# Patient Record
Sex: Female | Born: 1955 | Race: Black or African American | Hispanic: No | Marital: Married | Smoking: Never smoker
Health system: Southern US, Community
[De-identification: ages and names within clinical notes are randomized; demographics above are authoritative.]

## PROBLEM LIST (undated history)

## (undated) DIAGNOSIS — I10 Essential (primary) hypertension: Secondary | ICD-10-CM

## (undated) DIAGNOSIS — E785 Hyperlipidemia, unspecified: Secondary | ICD-10-CM

## (undated) DIAGNOSIS — K219 Gastro-esophageal reflux disease without esophagitis: Secondary | ICD-10-CM

## (undated) DIAGNOSIS — C259 Malignant neoplasm of pancreas, unspecified: Secondary | ICD-10-CM

## (undated) DIAGNOSIS — F32A Depression, unspecified: Secondary | ICD-10-CM

## (undated) DIAGNOSIS — E669 Obesity, unspecified: Secondary | ICD-10-CM

## (undated) DIAGNOSIS — O2230 Deep phlebothrombosis in pregnancy, unspecified trimester: Secondary | ICD-10-CM

## (undated) DIAGNOSIS — E119 Type 2 diabetes mellitus without complications: Secondary | ICD-10-CM

## (undated) DIAGNOSIS — E039 Hypothyroidism, unspecified: Secondary | ICD-10-CM

## (undated) DIAGNOSIS — K5792 Diverticulitis of intestine, part unspecified, without perforation or abscess without bleeding: Secondary | ICD-10-CM

## (undated) DIAGNOSIS — F329 Major depressive disorder, single episode, unspecified: Secondary | ICD-10-CM

## (undated) HISTORY — DX: Hypercalcemia: E83.52

## (undated) HISTORY — DX: Malignant neoplasm of pancreas, unspecified: C25.9

---

## 2003-08-22 HISTORY — PX: ABDOMINAL HYSTERECTOMY: SHX81

## 2005-03-16 ENCOUNTER — Ambulatory Visit: Payer: Self-pay

## 2006-03-19 ENCOUNTER — Ambulatory Visit: Payer: Self-pay

## 2007-07-11 ENCOUNTER — Ambulatory Visit: Payer: Self-pay

## 2008-08-04 ENCOUNTER — Ambulatory Visit: Payer: Self-pay | Admitting: Gastroenterology

## 2008-09-17 ENCOUNTER — Ambulatory Visit: Payer: Self-pay

## 2009-12-01 ENCOUNTER — Ambulatory Visit: Payer: Self-pay | Admitting: Gastroenterology

## 2010-01-04 ENCOUNTER — Ambulatory Visit: Payer: Self-pay

## 2010-04-06 ENCOUNTER — Ambulatory Visit: Payer: Self-pay | Admitting: Endocrinology

## 2011-02-14 ENCOUNTER — Ambulatory Visit: Payer: Self-pay

## 2012-04-03 ENCOUNTER — Ambulatory Visit: Payer: Self-pay

## 2012-05-07 ENCOUNTER — Emergency Department: Payer: Self-pay | Admitting: Internal Medicine

## 2012-05-07 LAB — COMPREHENSIVE METABOLIC PANEL
Albumin: 3.5 g/dL (ref 3.4–5.0)
Alkaline Phosphatase: 108 U/L (ref 50–136)
Bilirubin,Total: 0.3 mg/dL (ref 0.2–1.0)
Creatinine: 1.4 mg/dL — ABNORMAL HIGH (ref 0.60–1.30)
Glucose: 95 mg/dL (ref 65–99)
Osmolality: 286 (ref 275–301)
SGPT (ALT): 28 U/L (ref 12–78)
Sodium: 142 mmol/L (ref 136–145)

## 2012-05-07 LAB — CBC WITH DIFFERENTIAL/PLATELET
Basophil #: 0 10*3/uL (ref 0.0–0.1)
Basophil %: 0.4 %
Eosinophil #: 0.8 10*3/uL — ABNORMAL HIGH (ref 0.0–0.7)
Eosinophil %: 6.5 %
HCT: 39.9 % (ref 35.0–47.0)
Lymphocyte #: 4.6 10*3/uL — ABNORMAL HIGH (ref 1.0–3.6)
Lymphocyte %: 39.3 %
MCHC: 32.7 g/dL (ref 32.0–36.0)
MCV: 88 fL (ref 80–100)
Monocyte #: 0.9 x10 3/mm (ref 0.2–0.9)
Monocyte %: 7.5 %
Neutrophil #: 5.4 10*3/uL (ref 1.4–6.5)
Platelet: 378 10*3/uL (ref 150–440)
RBC: 4.55 10*6/uL (ref 3.80–5.20)
RDW: 14 % (ref 11.5–14.5)
WBC: 11.7 10*3/uL — ABNORMAL HIGH (ref 3.6–11.0)

## 2012-05-07 LAB — TROPONIN I: Troponin-I: <0.02 ng/mL

## 2012-05-07 LAB — LIPASE, BLOOD: Lipase: 204 U/L (ref 73–393)

## 2012-06-06 ENCOUNTER — Ambulatory Visit: Payer: Self-pay | Admitting: Emergency Medicine

## 2012-06-06 LAB — HEPATIC FUNCTION PANEL A (ARMC)
Alkaline Phosphatase: 98 U/L (ref 50–136)
SGOT(AST): 19 U/L (ref 15–37)

## 2012-06-13 ENCOUNTER — Ambulatory Visit: Payer: Self-pay | Admitting: Emergency Medicine

## 2013-08-21 HISTORY — PX: CHOLECYSTECTOMY: SHX55

## 2013-09-24 ENCOUNTER — Ambulatory Visit: Payer: Self-pay

## 2014-12-08 NOTE — Op Note (Signed)
PATIENT NAME:  Ann Larson, Ann Larson MR#:  [id number] DATE OF BIRTH:  Jul 04, 1956  DATE OF PROCEDURE:  06/13/2012  PREOPERATIVE DIAGNOSIS: Acute cholecystitis possibly due to polyp of the gallbladder.  POSTOPERATIVE DIAGNOSIS: Acute cholecystitis possibly due to polyp of the gallbladder.   OPERATION: Laparoscopic cholecystectomy.   SURGEON: Samatha Anspach S. Jream Broyles, MD   INDICATIONS: This patient was having pain in the right upper quadrant of the abdomen, had an ultrasound done that showed a 7 mm polyp in the gallbladder.   PROCEDURE: The patient was then brought to surgery. Under general anesthesia, the abdomen was then prepped and draped. A small incision was made above the umbilicus. After cutting skin and subcutaneous tissue, the fascia was cut, abdomen was then entered, and CO2 was insufflated. Another trocar was put in the epigastric region. Two 5 millimeters were put in the right upper quadrant of the abdomen. Gallbladder was then pushed up and dissection was done over the cystic duct gallbladder junction. There was a lot of inflammation. It was dissected out. Cystic duct was dissected out completely. It was then clipped and cut and the gallbladder was removed from the liver bed. Cystic artery was then clipped and cut. After this was removed the area was then washed out. There was no bleeding or biliary leakage. After that all the trocars were removed under direct vision. The umbilical port was then closed with interrupted 0 Vicryl sutures and the skin was closed with skin staples. The patient tolerated the procedure well. Marcaine was injected. The patient was sent to the recovery room in satisfactory condition.   ____________________________ Welford Roche Phylis Bougie, MD msh:drc D: 06/13/2012 13:22:42 ET T: 06/13/2012 13:47:16 ET JOB#: [id number]  cc: Braylyn Eye S. Phylis Bougie, MD, <Dictator> Dionisio David, MD Sharene Butters MD ELECTRONICALLY SIGNED 06/18/2012 12:49

## 2015-06-30 ENCOUNTER — Encounter: Payer: Self-pay | Admitting: *Deleted

## 2015-07-01 ENCOUNTER — Ambulatory Visit: Payer: 59 | Admitting: Anesthesiology

## 2015-07-01 ENCOUNTER — Encounter: Payer: Self-pay | Admitting: Anesthesiology

## 2015-07-01 ENCOUNTER — Encounter
Admission: RE | Disposition: A | Discharge status: Home / Self Care | Payer: Self-pay | Source: Ambulatory Visit | Attending: Gastroenterology

## 2015-07-01 ENCOUNTER — Ambulatory Visit
Admission: RE | Admit: 2015-07-01 | Discharge: 2015-07-01 | Disposition: A | Discharge status: Home / Self Care | Payer: 59 | Source: Ambulatory Visit | Attending: Gastroenterology | Admitting: Gastroenterology

## 2015-07-01 DIAGNOSIS — J449 Chronic obstructive pulmonary disease, unspecified: Secondary | ICD-10-CM | POA: Diagnosis not present

## 2015-07-01 DIAGNOSIS — K5730 Diverticulosis of large intestine without perforation or abscess without bleeding: Secondary | ICD-10-CM | POA: Diagnosis not present

## 2015-07-01 DIAGNOSIS — K317 Polyp of stomach and duodenum: Secondary | ICD-10-CM | POA: Insufficient documentation

## 2015-07-01 DIAGNOSIS — Z7984 Long term (current) use of oral hypoglycemic drugs: Secondary | ICD-10-CM | POA: Diagnosis not present

## 2015-07-01 DIAGNOSIS — Z6836 Body mass index (BMI) 36.0-36.9, adult: Secondary | ICD-10-CM | POA: Insufficient documentation

## 2015-07-01 DIAGNOSIS — E119 Type 2 diabetes mellitus without complications: Secondary | ICD-10-CM | POA: Insufficient documentation

## 2015-07-01 DIAGNOSIS — Z79899 Other long term (current) drug therapy: Secondary | ICD-10-CM | POA: Insufficient documentation

## 2015-07-01 DIAGNOSIS — R1310 Dysphagia, unspecified: Secondary | ICD-10-CM | POA: Insufficient documentation

## 2015-07-01 DIAGNOSIS — E669 Obesity, unspecified: Secondary | ICD-10-CM | POA: Insufficient documentation

## 2015-07-01 DIAGNOSIS — K644 Residual hemorrhoidal skin tags: Secondary | ICD-10-CM | POA: Insufficient documentation

## 2015-07-01 DIAGNOSIS — R197 Diarrhea, unspecified: Secondary | ICD-10-CM | POA: Diagnosis present

## 2015-07-01 DIAGNOSIS — E039 Hypothyroidism, unspecified: Secondary | ICD-10-CM | POA: Diagnosis not present

## 2015-07-01 DIAGNOSIS — I10 Essential (primary) hypertension: Secondary | ICD-10-CM | POA: Diagnosis not present

## 2015-07-01 DIAGNOSIS — K210 Gastro-esophageal reflux disease with esophagitis: Secondary | ICD-10-CM | POA: Insufficient documentation

## 2015-07-01 DIAGNOSIS — F329 Major depressive disorder, single episode, unspecified: Secondary | ICD-10-CM | POA: Diagnosis not present

## 2015-07-01 DIAGNOSIS — K640 First degree hemorrhoids: Secondary | ICD-10-CM | POA: Insufficient documentation

## 2015-07-01 DIAGNOSIS — E785 Hyperlipidemia, unspecified: Secondary | ICD-10-CM | POA: Insufficient documentation

## 2015-07-01 HISTORY — PX: COLONOSCOPY WITH PROPOFOL: SHX5780

## 2015-07-01 HISTORY — DX: Type 2 diabetes mellitus without complications: E11.9

## 2015-07-01 HISTORY — DX: Hyperlipidemia, unspecified: E78.5

## 2015-07-01 HISTORY — PX: ESOPHAGOGASTRODUODENOSCOPY (EGD) WITH PROPOFOL: SHX5813

## 2015-07-01 HISTORY — DX: Depression, unspecified: F32.A

## 2015-07-01 HISTORY — DX: Obesity, unspecified: E66.9

## 2015-07-01 HISTORY — DX: Major depressive disorder, single episode, unspecified: F32.9

## 2015-07-01 HISTORY — DX: Essential (primary) hypertension: I10

## 2015-07-01 HISTORY — DX: Hypothyroidism, unspecified: E03.9

## 2015-07-01 LAB — GLUCOSE, CAPILLARY: Glucose-Capillary: 159 mg/dL — ABNORMAL HIGH (ref 65–99)

## 2015-07-01 SURGERY — COLONOSCOPY WITH PROPOFOL
Anesthesia: General

## 2015-07-01 MED ORDER — LIDOCAINE HCL (CARDIAC) 20 MG/ML IV SOLN
INTRAVENOUS | Status: DC | PRN
  Administered 2015-07-01: 30 mg via INTRAVENOUS

## 2015-07-01 MED ORDER — MIDAZOLAM HCL 5 MG/5ML IJ SOLN
INTRAMUSCULAR | Status: DC | PRN
  Administered 2015-07-01: 1 mg via INTRAVENOUS

## 2015-07-01 MED ORDER — METOPROLOL SUCCINATE ER 50 MG PO TB24
ORAL_TABLET | ORAL | Status: AC
  Administered 2015-07-01: 100 mg
  Filled 2015-07-01: qty 2

## 2015-07-01 MED ORDER — FENTANYL CITRATE (PF) 100 MCG/2ML IJ SOLN
INTRAMUSCULAR | Status: DC | PRN
  Administered 2015-07-01: 50 ug via INTRAVENOUS

## 2015-07-01 MED ORDER — SODIUM CHLORIDE 0.9 % IV SOLN
INTRAVENOUS | Status: DC
  Administered 2015-07-01: 09:00:00 via INTRAVENOUS
  Administered 2015-07-01: 1000 mL via INTRAVENOUS

## 2015-07-01 MED ORDER — PROPOFOL 10 MG/ML IV BOLUS
INTRAVENOUS | Status: DC | PRN
  Administered 2015-07-01: 20 mg via INTRAVENOUS
  Administered 2015-07-01: 100 mg via INTRAVENOUS

## 2015-07-01 MED ORDER — PROPOFOL 500 MG/50ML IV EMUL
INTRAVENOUS | Status: DC | PRN
  Administered 2015-07-01: 180 ug/kg/min via INTRAVENOUS

## 2015-07-01 NOTE — Op Note (Signed)
[facility] Gastroenterology Patient Name: Ann Larson Procedure Date: 07/01/2015 9:17 AM MRN: [id number] Account #: [id number] Date of Birth: 08-12-1956 Admit Type: Outpatient Age: 59 Room: [facility] ENDO ROOM 2 Gender: Female Note Status: Finalized Procedure:         Colonoscopy Indications:       This is the patient's first colonoscopy, Chronic diarrhea Patient Profile:   This is a 59 year old female. Providers:         Gerrit Heck. Rayann Heman, MD Referring MD:      Jordan Likes. Lavena Bullion (Referring MD) Medicines:         Propofol per Anesthesia Complications:     No immediate complications. Procedure:         Pre-Anesthesia Assessment:                    - Prior to the procedure, a History and Physical was                     performed, and patient medications, allergies and                     sensitivities were reviewed. The patient's tolerance of                     previous anesthesia was reviewed.                    After obtaining informed consent, the colonoscope was                     passed under direct vision. Throughout the procedure, the                     patient's blood pressure, pulse, and oxygen saturations                     were monitored continuously. The Colonoscope was                     introduced through the anus and advanced to the the                     terminal ileum. The colonoscopy was performed without                     difficulty. The patient tolerated the procedure well. The                     quality of the bowel preparation was good. Findings:      The perianal exam findings include non-thrombosed external hemorrhoids.      The terminal ileum appeared normal.      Multiple small and large-mouthed diverticula were found in the sigmoid       colon, at the hepatic flexure and in the ascending colon.      Internal hemorrhoids were found during retroflexion. The hemorrhoids       were Grade I (internal hemorrhoids that do not  prolapse).      The exam was otherwise without abnormality.      Biopsies for histology were taken with a cold forceps from the right       colon, left colon and rectum for evaluation of microscopic colitis. Impression:        - Non-thrombosed external hemorrhoids found on perianal  exam.                    - The examined portion of the ileum was normal.                    - Diverticulosis in the sigmoid colon, at the hepatic                     flexure and in the ascending colon.                    - Internal hemorrhoids.                    - The examination was otherwise normal.                    - No specimens collected. Recommendation:    - Observe patient in GI recovery unit.                    - High fiber diet.                    - Continue present medications.                    - Await pathology results.                    - Try low fodmap diet, probiotic, consider xifaxin for                     likely IBS-D                    - The findings and recommendations were discussed with the                     patient.                    - The findings and recommendations were discussed with the                     patient's family. Procedure Code(s): --- Professional ---                    [phone], Colonoscopy, flexible; with biopsy, single or                     multiple Diagnosis Code(s): --- Professional ---                    K64.0, First degree hemorrhoids                    K64.4, Residual hemorrhoidal skin tags                    K52.9, Noninfective gastroenteritis and colitis,                     unspecified                    K57.30, Diverticulosis of large intestine without                     perforation or abscess without bleeding CPT copyright 2014 American [employer]. All rights reserved. The codes documented in this report are preliminary and upon  coder review may  be revised to meet current compliance requirements. Mellody Life,  MD 07/01/2015 9:40:16 AM This report has been signed electronically. Number of Addenda: 0 Note Initiated On: 07/01/2015 9:17 AM Scope Withdrawal Time: 0 hours 13 minutes 24 seconds  Total Procedure Duration: 0 hours 15 minutes 46 seconds       [facility]

## 2015-07-01 NOTE — Anesthesia Preprocedure Evaluation (Signed)
Anesthesia Evaluation  Patient identified by MRN, date of birth, ID band Patient awake    Reviewed: Allergy & Precautions, NPO status , Patient's Chart, lab work & pertinent test results, reviewed documented beta blocker date and time   Airway Mallampati: II  TM Distance: >3 FB Neck ROM: Full    Dental  (+) Chipped   Pulmonary COPD,    Pulmonary exam normal breath sounds clear to auscultation       Cardiovascular hypertension, Pt. on medications and Pt. on home beta blockers      Neuro/Psych Depression    GI/Hepatic Neg liver ROS, GERD  Medicated and Controlled,  Endo/Other  diabetes, Well Controlled, Type 2Hypothyroidism   Renal/GU negative Renal ROS  negative genitourinary   Musculoskeletal negative musculoskeletal ROS (+)   Abdominal Normal abdominal exam  (+)   Peds negative pediatric ROS (+)  Hematology negative hematology ROS (+)   Anesthesia Other Findings   Reproductive/Obstetrics                             Anesthesia Physical Anesthesia Plan  ASA: III  Anesthesia Plan: General   Post-op Pain Management:    Induction: Intravenous  Airway Management Planned: Nasal Cannula  Additional Equipment:   Intra-op Plan:   Post-operative Plan:   Informed Consent: I have reviewed the patients History and Physical, chart, labs and discussed the procedure including the risks, benefits and alternatives for the proposed anesthesia with the patient or authorized representative who has indicated his/her understanding and acceptance.   Dental advisory given  Plan Discussed with: CRNA and Surgeon  Anesthesia Plan Comments:         Anesthesia Quick Evaluation

## 2015-07-01 NOTE — H&P (Signed)
  Primary Care Physician:  Lorelee Market, MD  Pre-Procedure History & Physical: HPI:  Ann Larson is a 59 y.o. female is here for an colonoscopy / EGD   Past Medical History  Diagnosis Date  . COPD (chronic obstructive pulmonary disease) ([place])   . Depression   . Diabetes mellitus without complication ([place])   . Hypertension   . Obesity   . Hyperlipidemia   . Hypothyroidism     History reviewed. No pertinent past surgical history.  Prior to Admission medications   Medication Sig Start Date End Date Taking? Authorizing Provider  levothyroxine (SYNTHROID, LEVOTHROID) 125 MCG tablet Take 125 mcg by mouth daily before breakfast.   Yes Historical Provider, MD  lisinopril (PRINIVIL,ZESTRIL) 10 MG tablet Take 10 mg by mouth daily.   Yes Historical Provider, MD  metFORMIN (GLUMETZA) 500 MG (MOD) 24 hr tablet Take 500 mg by mouth daily with supper.   Yes Historical Provider, MD  metoprolol succinate (TOPROL-XL) 100 MG 24 hr tablet Take 100 mg by mouth daily. Take with or immediately following a meal.   Yes Historical Provider, MD  omeprazole (PRILOSEC) 20 MG capsule Take 20 mg by mouth daily.   Yes Historical Provider, MD  polyethylene glycol-electrolytes (NULYTELY/GOLYTELY) 420 G solution Take 4,000 mLs by mouth once.   Yes Historical Provider, MD  simvastatin (ZOCOR) 20 MG tablet Take 20 mg by mouth daily.   Yes Historical Provider, MD  venlafaxine XR (EFFEXOR-XR) 75 MG 24 hr capsule Take 75 mg by mouth daily with breakfast.   Yes Historical Provider, MD    Allergies as of 06/01/2015  . (Not on File)    History reviewed. No pertinent family history.  Social History   Social History  . Marital Status: Married    Spouse Name: N/A  . Number of Children: N/A  . Years of Education: N/A   Occupational History  . Not on file.   Social History Main Topics  . Smoking status: Not on file  . Smokeless tobacco: Not on file  . Alcohol Use: Not on file  . Drug Use: Not on  file  . Sexual Activity: Not on file   Other Topics Concern  . Not on file   Social History Narrative     Physical Exam: BP 181/97 mmHg  Pulse 74  Temp(Src) 99.7 F (37.6 C) (Tympanic)  Resp 16  Ht 5\' 7"  (1.702 m)  Wt 106.595 kg (235 lb)  BMI 36.80 kg/m2  SpO2 98% General:   Alert,  pleasant and cooperative in NAD Head:  Normocephalic and atraumatic. Neck:  Supple; no masses or thyromegaly. Lungs:  Clear throughout to auscultation.    Heart:  Regular rate and rhythm. Abdomen:  Soft, nontender and nondistended. Normal bowel sounds, without guarding, and without rebound., + obese abdomen Neurologic:  Alert and  oriented x4;  grossly normal neurologically.  Impression/Plan: SAMEENA CORRIHER is here for an colonoscopy to be performed for diarrhea, EGD foro dysphagia  Risks, benefits, limitations, and alternatives regarding  Colonoscopy / EGD have been reviewed with the patient.  Questions have been answered.  All parties agreeable.   Josefine Class, MD  07/01/2015, 8:48 AM

## 2015-07-01 NOTE — Op Note (Signed)
[facility] Gastroenterology Patient Name: Ann Larson Procedure Date: 07/01/2015 8:50 AM MRN: [id number] Account #: [id number] Date of Birth: [birth year] Admit Type: Outpatient Age: 59 Room: [facility] ENDO ROOM 2 Gender: Female Note Status: Finalized Procedure:         Upper GI endoscopy Indications:       Dysphagia Patient Profile:   This is a 59 year old female. Providers:         Gerrit Heck. Rayann Heman, MD Referring MD:      Jordan Likes. Lavena Bullion (Referring MD) Medicines:         Propofol per Anesthesia Complications:     No immediate complications. Procedure:         Pre-Anesthesia Assessment:                    - Prior to the procedure, a History and Physical was                     performed, and patient medications, allergies and                     sensitivities were reviewed. The patient's tolerance of                     previous anesthesia was reviewed.                    After obtaining informed consent, the endoscope was passed                     under direct vision. Throughout the procedure, the                     patient's blood pressure, pulse, and oxygen saturations                     were monitored continuously. The Endoscope was introduced                     through the mouth, and advanced to the second part of                     duodenum. The upper GI endoscopy was accomplished without                     difficulty. The patient tolerated the procedure well. Findings:      The esophagus was normal.      Multiple 2 to 8 mm sessile polyps with no stigmata of recent bleeding       were found in the gastric body. Biopsies were taken with a cold forceps       for histology.      The examined duodenum was normal.      Four biopsies were obtained with cold forceps for histology randomly in       the lower third of the esophagus.      Four biopsies were obtained with cold forceps for histology randomly in       the upper third of the esophagus.      A  guidewire was placed and the scope was withdrawn. Dilation was       performed in the entire esophagus with a Savary dilator with no       resistance at 51 Fr. Impression:        - Normal esophagus.                    -  Multiple gastric polyps. Biopsied.                    - Normal examined duodenum.                    - Four biopsies were obtained in the lower third of the                     esophagus.                    - Four biopsies were obtained in the upper third of the                     esophagus.                    - Dilation attempted in the entire esophagus. Successful. Recommendation:    - Perform a colonoscopy today.                    - Continue present medications.                    - Await pathology results.                    - Consider esophageal manometry if biopsies are                     non-diagnostic and dysphagia continues despite dilation                     today.                    - The findings and recommendations were discussed with the                     patient.                    - The findings and recommendations were discussed with the                     patient's family. Procedure Code(s): --- Professional ---                    [phone], Esophagogastroduodenoscopy, flexible, transoral;                     with insertion of guide wire followed by passage of                     dilator(s) through esophagus over guide wire                    [id number], Esophagogastroduodenoscopy, flexible, transoral;                     with biopsy, single or multiple Diagnosis Code(s): --- Professional ---                    K31.7, Polyp of stomach and duodenum                    R13.10, Dysphagia, unspecified CPT copyright 2014 American Medical Association. All rights reserved. The codes documented in this report are preliminary and upon coder review may  be revised to meet current compliance requirements. Mellody Life, MD  07/01/2015 9:17:44 AM This report has  been signed electronically. Number of Addenda: 0 Note Initiated On: 07/01/2015 8:50 AM      [facility]

## 2015-07-01 NOTE — Transfer of Care (Signed)
Immediate Anesthesia Transfer of Care Note  Patient: Ann Larson  Procedure(s) Performed: Procedure(s): COLONOSCOPY WITH PROPOFOL (N/A) ESOPHAGOGASTRODUODENOSCOPY (EGD) WITH PROPOFOL (N/A)  Patient Location: PACU and Short Stay  Anesthesia Type:General  Level of Consciousness: awake, oriented and patient cooperative  Airway & Oxygen Therapy: Patient Spontanous Breathing and Patient connected to nasal cannula oxygen  Post-op Assessment: Report given to RN and Post -op Vital signs reviewed and stable  Post vital signs: Reviewed and stable  Last Vitals:  Filed Vitals:   07/01/15 0945  BP:   Pulse: 64  Temp: 36.1 C  Resp: 18    Complications: No apparent anesthesia complications

## 2015-07-01 NOTE — Anesthesia Postprocedure Evaluation (Signed)
  Anesthesia Post-op Note  Patient: Ann Larson  Procedure(s) Performed: Procedure(s): COLONOSCOPY WITH PROPOFOL (N/A) ESOPHAGOGASTRODUODENOSCOPY (EGD) WITH PROPOFOL (N/A)  Anesthesia type:General  Patient location: PACU  Post pain: Pain level controlled  Post assessment: Post-op Vital signs reviewed, Patient's Cardiovascular Status Stable, Respiratory Function Stable, Patent Airway and No signs of Nausea or vomiting  Post vital signs: Reviewed and stable  Last Vitals:  Filed Vitals:   07/01/15 1014  BP: 130/57  Pulse: 55  Temp:   Resp: 16    Level of consciousness: awake, alert  and patient cooperative  Complications: No apparent anesthesia complications

## 2015-07-01 NOTE — Discharge Instructions (Signed)

## 2015-07-02 LAB — SURGICAL PATHOLOGY

## 2015-07-05 ENCOUNTER — Encounter: Payer: Self-pay | Admitting: Gastroenterology

## 2015-11-03 ENCOUNTER — Other Ambulatory Visit: Payer: Self-pay | Admitting: Obstetrics and Gynecology

## 2015-11-03 DIAGNOSIS — Z1231 Encounter for screening mammogram for malignant neoplasm of breast: Secondary | ICD-10-CM

## 2015-11-08 ENCOUNTER — Ambulatory Visit
Admission: RE | Admit: 2015-11-08 | Discharge: 2015-11-08 | Disposition: A | Discharge status: Home / Self Care | Payer: 59 | Source: Ambulatory Visit | Attending: Obstetrics and Gynecology | Admitting: Obstetrics and Gynecology

## 2015-11-08 DIAGNOSIS — Z1231 Encounter for screening mammogram for malignant neoplasm of breast: Secondary | ICD-10-CM | POA: Diagnosis present

## 2016-11-12 ENCOUNTER — Encounter: Payer: Self-pay | Admitting: Emergency Medicine

## 2016-11-12 ENCOUNTER — Emergency Department: Payer: BLUE CROSS/BLUE SHIELD

## 2016-11-12 ENCOUNTER — Emergency Department
Admission: EM | Admit: 2016-11-12 | Discharge: 2016-11-13 | Disposition: A | Discharge status: Home / Self Care | Payer: BLUE CROSS/BLUE SHIELD | Attending: Emergency Medicine | Admitting: Emergency Medicine

## 2016-11-12 DIAGNOSIS — E039 Hypothyroidism, unspecified: Secondary | ICD-10-CM | POA: Insufficient documentation

## 2016-11-12 DIAGNOSIS — K5732 Diverticulitis of large intestine without perforation or abscess without bleeding: Secondary | ICD-10-CM | POA: Insufficient documentation

## 2016-11-12 DIAGNOSIS — I10 Essential (primary) hypertension: Secondary | ICD-10-CM | POA: Diagnosis not present

## 2016-11-12 DIAGNOSIS — R55 Syncope and collapse: Secondary | ICD-10-CM | POA: Insufficient documentation

## 2016-11-12 DIAGNOSIS — Z79899 Other long term (current) drug therapy: Secondary | ICD-10-CM | POA: Diagnosis not present

## 2016-11-12 DIAGNOSIS — E119 Type 2 diabetes mellitus without complications: Secondary | ICD-10-CM | POA: Insufficient documentation

## 2016-11-12 DIAGNOSIS — Z7984 Long term (current) use of oral hypoglycemic drugs: Secondary | ICD-10-CM | POA: Diagnosis not present

## 2016-11-12 DIAGNOSIS — K5792 Diverticulitis of intestine, part unspecified, without perforation or abscess without bleeding: Secondary | ICD-10-CM

## 2016-11-12 DIAGNOSIS — R1032 Left lower quadrant pain: Secondary | ICD-10-CM | POA: Diagnosis present

## 2016-11-12 HISTORY — DX: Gastro-esophageal reflux disease without esophagitis: K21.9

## 2016-11-12 LAB — HEPATIC FUNCTION PANEL
ALT: 26 U/L (ref 14–54)
AST: 23 U/L (ref 15–41)
Albumin: 3.6 g/dL (ref 3.5–5.0)
Alkaline Phosphatase: 96 U/L (ref 38–126)
BILIRUBIN TOTAL: 0.4 mg/dL (ref 0.3–1.2)
Total Protein: 7.4 g/dL (ref 6.5–8.1)

## 2016-11-12 LAB — CBC
HCT: 39.5 % (ref 35.0–47.0)
Hemoglobin: 13.2 g/dL (ref 12.0–16.0)
MCH: 29 pg (ref 26.0–34.0)
MCHC: 33.3 g/dL (ref 32.0–36.0)
MCV: 87 fL (ref 80.0–100.0)
PLATELETS: 356 10*3/uL (ref 150–440)
RBC: 4.54 MIL/uL (ref 3.80–5.20)
RDW: 14.2 % (ref 11.5–14.5)
WBC: 7.2 10*3/uL (ref 3.6–11.0)

## 2016-11-12 LAB — DIFFERENTIAL
BASOS ABS: 0 10*3/uL (ref 0–0.1)
BASOS PCT: 0 %
EOS ABS: 0.3 10*3/uL (ref 0–0.7)
Eosinophils Relative: 4 %
Lymphocytes Relative: 28 %
Lymphs Abs: 2.1 10*3/uL (ref 1.0–3.6)
MONOS PCT: 8 %
Monocytes Absolute: 0.6 10*3/uL (ref 0.2–0.9)
NEUTROS PCT: 60 %
Neutro Abs: 4.3 10*3/uL (ref 1.4–6.5)

## 2016-11-12 LAB — BASIC METABOLIC PANEL
Anion gap: 5 (ref 5–15)
BUN: 20 mg/dL (ref 6–20)
CALCIUM: 10.7 mg/dL — AB (ref 8.9–10.3)
CO2: 28 mmol/L (ref 22–32)
CREATININE: 1.16 mg/dL — AB (ref 0.44–1.00)
Chloride: 109 mmol/L (ref 101–111)
GFR calc non Af Amer: 50 mL/min — ABNORMAL LOW (ref >60)
GFR, EST AFRICAN AMERICAN: 58 mL/min — AB (ref >60)
Glucose, Bld: 201 mg/dL — ABNORMAL HIGH (ref 65–99)
Potassium: 4.1 mmol/L (ref 3.5–5.1)
SODIUM: 142 mmol/L (ref 135–145)

## 2016-11-12 LAB — TROPONIN I: Troponin I: <0.03 ng/mL (ref <0.03)

## 2016-11-12 LAB — TSH: TSH: 1.798 u[IU]/mL (ref 0.350–4.500)

## 2016-11-12 LAB — URINALYSIS, COMPLETE (UACMP) WITH MICROSCOPIC
BILIRUBIN URINE: NEGATIVE
GLUCOSE, UA: NEGATIVE mg/dL
Hgb urine dipstick: NEGATIVE
KETONES UR: NEGATIVE mg/dL
LEUKOCYTES UA: NEGATIVE
Nitrite: NEGATIVE
PH: 5 (ref 5.0–8.0)
Protein, ur: NEGATIVE mg/dL
Specific Gravity, Urine: 1.026 (ref 1.005–1.030)

## 2016-11-12 LAB — T4, FREE: Free T4: 1.09 ng/dL (ref 0.61–1.12)

## 2016-11-12 LAB — LIPASE, BLOOD: LIPASE: 17 U/L (ref 11–51)

## 2016-11-12 LAB — MAGNESIUM: MAGNESIUM: 2.1 mg/dL (ref 1.7–2.4)

## 2016-11-12 MED ORDER — IOPAMIDOL (ISOVUE-300) INJECTION 61%
100.0000 mL | Freq: Once | INTRAVENOUS | Status: AC | PRN
  Administered 2016-11-12: 100 mL via INTRAVENOUS

## 2016-11-12 MED ORDER — IOPAMIDOL (ISOVUE-300) INJECTION 61%
30.0000 mL | Freq: Once | INTRAVENOUS | Status: AC | PRN
  Administered 2016-11-12: 30 mL via ORAL

## 2016-11-12 NOTE — ED Provider Notes (Addendum)
[facility] Emergency Department Provider Note  ____________________________________________   First MD Initiated Contact with Patient 11/12/16 2224     (approximate)  I have reviewed the triage vital signs and the nursing notes.   HISTORY  Chief Complaint Abdominal Pain    HPI Ann Larson is a 61 y.o. female with history as listed below who presents for evaluation of a near syncopal episode as well as left lower quadrant abdominal pain.  She states that her abdominal pain has been present for several weeks but is gradually gotten worse recently.  Nothing makes it better and movement makes it worse.  It is primarily in the left lower quadrant but radiates to the rest of her abdomen.  She denies nausea and vomiting, fever/chills, chest pain, shortness of breath, dysuria.  Regarding the near syncopal episodes, she reports that she had a normal day at church, singing and participating and possibly overexerting herself.  She felt fine until she was in the car on the way home when she became sweaty and lightheaded and felt like she was going to pass out.  She never had any chest pain but did feel like her heart was pounding.  Overall she describes both symptoms as severe although she is not in any pain while lying comfortably in the exam bed   Past Medical History:  Diagnosis Date  . Depression   . Diabetes mellitus without complication ([place])   . GERD (gastroesophageal reflux disease)   . Hyperlipidemia   . Hypertension   . Hypothyroidism   . Obesity     There are no active problems to display for this patient.   Past Surgical History:  Procedure Laterality Date  . ABDOMINAL HYSTERECTOMY  2005  . CHOLECYSTECTOMY  2015  . COLONOSCOPY WITH PROPOFOL N/A 07/01/2015   Procedure: COLONOSCOPY WITH PROPOFOL;  Surgeon: Josefine Class, MD;  Location: [facility] ENDOSCOPY;  Service: Endoscopy;  Laterality: N/A;  . ESOPHAGOGASTRODUODENOSCOPY (EGD) WITH  PROPOFOL N/A 07/01/2015   Procedure: ESOPHAGOGASTRODUODENOSCOPY (EGD) WITH PROPOFOL;  Surgeon: Josefine Class, MD;  Location: [facility] ENDOSCOPY;  Service: Endoscopy;  Laterality: N/A;    Prior to Admission medications   Medication Sig Start Date End Date Taking? Authorizing Provider  ciprofloxacin (CIPRO) 500 MG tablet Take 1 tablet (500 mg total) by mouth 2 (two) times daily. 11/13/16 11/23/16  Hinda Kehr, MD  levothyroxine (SYNTHROID, LEVOTHROID) 125 MCG tablet Take 125 mcg by mouth daily before breakfast.    Historical Provider, MD  lisinopril (PRINIVIL,ZESTRIL) 10 MG tablet Take 10 mg by mouth daily.    Historical Provider, MD  metFORMIN (GLUMETZA) 500 MG (MOD) 24 hr tablet Take 500 mg by mouth daily with supper.    Historical Provider, MD  metoprolol succinate (TOPROL-XL) 100 MG 24 hr tablet Take 100 mg by mouth daily. Take with or immediately following a meal.    Historical Provider, MD  metroNIDAZOLE (FLAGYL) 500 MG tablet Take 1 tablet (500 mg total) by mouth 3 (three) times daily. 11/13/16 11/23/16  Hinda Kehr, MD  omeprazole (PRILOSEC) 20 MG capsule Take 20 mg by mouth daily.    Historical Provider, MD  polyethylene glycol-electrolytes (NULYTELY/GOLYTELY) 420 G solution Take 4,000 mLs by mouth once.    Historical Provider, MD  simvastatin (ZOCOR) 20 MG tablet Take 20 mg by mouth daily.    Historical Provider, MD  venlafaxine XR (EFFEXOR-XR) 75 MG 24 hr capsule Take 75 mg by mouth daily with breakfast.    Historical Provider, MD  Allergies Sulfa antibiotics  History reviewed. No pertinent family history.  Social History Social History  Substance Use Topics  . Smoking status: Never Smoker  . Smokeless tobacco: Never Used  . Alcohol use No    Review of Systems Constitutional: No fever/chills Eyes: No visual changes. ENT: No sore throat. Cardiovascular: Denies chest pain. + Palpitations when she had her near syncopal episode Respiratory: Denies shortness of  breath. Gastrointestinal: Gradually worsening left lower quadrant abdominal pain.  No nausea, no vomiting.  No diarrhea.  No constipation. Genitourinary: Negative for dysuria. Musculoskeletal: Negative for back pain. Skin: Negative for rash. Neurological: Negative for headaches, focal weakness or numbness.  Near syncopal episode or to arrival  10-point ROS otherwise negative.  ____________________________________________   PHYSICAL EXAM:  VITAL SIGNS: ED Triage Vitals  Enc Vitals Group     BP 11/12/16 1741 (!) 158/61     Pulse Rate 11/12/16 1741 77     Resp 11/12/16 1741 20     Temp 11/12/16 1741 98.2 F (36.8 C)     Temp Source 11/12/16 1741 Oral     SpO2 11/12/16 1741 95 %     Weight 11/12/16 1742 225 lb (102.1 kg)     Height 11/12/16 1742 5\' 10"  (1.778 m)     Head Circumference --      Peak Flow --      Pain Score 11/12/16 1753 3     Pain Loc --      Pain Edu? --      Excl. in [place]? --     Constitutional: Alert and oriented. Well appearing and in no acute distress. Eyes: Conjunctivae are normal. PERRL. EOMI. Head: Atraumatic. Nose: No congestion/rhinnorhea. Mouth/Throat: Mucous membranes are moist. Neck: No stridor.  No meningeal signs.   Cardiovascular: Normal rate, regular rhythm. Good peripheral circulation. Grossly normal heart sounds. Respiratory: Normal respiratory effort.  No retractions. Lungs CTAB. Gastrointestinal: Obese.  Soft with severe tenderness to palpation of the left lower quadrant with rebound tenderness and guarding.  She also has a positive Rovsing sign where palpation on the right side of her abdomen causes left sided tenderness. Musculoskeletal: No lower extremity tenderness nor edema. No gross deformities of extremities. Neurologic:  Normal speech and language. No gross focal neurologic deficits are appreciated.  Skin:  Skin is warm, dry and intact. No rash noted. Psychiatric: Mood and affect are normal. Speech and behavior are  normal.  ____________________________________________   LABS (all labs ordered are listed, but only abnormal results are displayed)  Labs Reviewed  BASIC METABOLIC PANEL - Abnormal; Notable for the following:       Result Value   Glucose, Bld 201 (*)    Creatinine, Ser 1.16 (*)    Calcium 10.7 (*)    GFR calc non Af Amer 50 (*)    GFR calc Af Amer 58 (*)    All other components within normal limits  URINALYSIS, COMPLETE (UACMP) WITH MICROSCOPIC - Abnormal; Notable for the following:    Color, Urine YELLOW (*)    APPearance HAZY (*)    Bacteria, UA RARE (*)    Squamous Epithelial / LPF 0-5 (*)    All other components within normal limits  HEPATIC FUNCTION PANEL - Abnormal; Notable for the following:    Bilirubin, Direct <0.1 (*)    All other components within normal limits  CBC  DIFFERENTIAL  T4, FREE  TSH  LIPASE, BLOOD  TROPONIN I  MAGNESIUM  TROPONIN I   ____________________________________________  EKG  ED ECG REPORT I, Kyndal Heringer, the attending physician, personally viewed and interpreted this ECG.  Date: 11/12/2016 EKG Time: 18:01 Rate: 77 Rhythm: normal sinus rhythm QRS Axis: normal Intervals: LVH ST/T Wave abnormalities: Non-specific ST segment / T-wave changes, but no evidence of acute ischemia. Conduction Disturbances: none Narrative Interpretation: unremarkable  ____________________________________________  RADIOLOGY   Ct Abdomen Pelvis W Contrast  Result Date: 11/12/2016 CLINICAL DATA:  Left lower quadrant pain with rebound. Progressive pain for 1 month. Nausea. EXAM: CT ABDOMEN AND PELVIS WITH CONTRAST TECHNIQUE: Multidetector CT imaging of the abdomen and pelvis was performed using the standard protocol following bolus administration of intravenous contrast. CONTRAST:  131mL ISOVUE-300 IOPAMIDOL (ISOVUE-300) INJECTION 61% COMPARISON:  None. FINDINGS: Lower chest: Minimal lingular and lower lobe atelectasis. No pleural fluid. Motion artifact.  Hepatobiliary: No focal liver abnormality is seen. Status post cholecystectomy. No biliary dilatation. Pancreas: Minimal stranding about the tail the pancreas may be secondary to left colonic inflammation. Questionable soft tissue lesion above the pancreatic tail, margins are ill-defined, motion artifact partially obscures this region. This is in the region of the splenic hilum and similar density to the spleen, may simply represent a splenule. No ductal dilatation. Fatty atrophy of the head and uncinate process. Spleen: Normal in size. Adrenals/Urinary Tract: No adrenal nodule. No hydronephrosis or perinephric edema. Symmetric renal enhancement and excretion. Urinary bladder is near completely decompressed. Stomach/Bowel: Multifocal colonic diverticulosis with acute diverticulitis involving distal descending colon. An inflamed diverticulum has surrounding fat stranding and soft tissue thickening in the pericolic gutter. No abscess or free air. Additional noninflamed diverticula are seen in the transverse, descending, and sigmoid colon. No additional acute diverticulitis. Normal appendix. No small bowel inflammation. Vascular/Lymphatic: Minimal aortic atherosclerosis without aneurysm. Small pericolonic lymph nodes adjacent to diverticular inflammation in the descending colon, no additional adenopathy. Reproductive: Status post hysterectomy. No adnexal masses. Other: No free air or intra-abdominal abscess. No ascites. Broad necked fat containing umbilical hernia. Musculoskeletal: There are no acute or suspicious osseous abnormalities. There is degenerative change in the spine. IMPRESSION: 1. Acute diverticulitis involving the distal descending colon. No evidence of perforation or abscess. Multiple noninflamed diverticula throughout the remainder of the colon from the transverse through the sigmoid. 2. Questionable pancreatic tail lesion, borders are not well defined. This may simply reflect a splenule given similar  density to the adjacent spleen, however recommend nonemergent MRI characterization. Electronically Signed   By: Jeb Levering M.D.   On: 11/12/2016 23:24    ____________________________________________   PROCEDURES  Critical Care performed: No   Procedure(s) performed:   Procedures   ____________________________________________   INITIAL IMPRESSION / ASSESSMENT AND PLAN / ED COURSE  Pertinent labs & imaging results that were available during my care of the patient were reviewed by me and considered in my medical decision making (see chart for details).  Although the patient ostensibly came in for evaluation of near-syncope, her abdominal exam is the most concerning.  I suspect she may have diverticulitis but reassuringly her signs are normal and as for her lab work is normal as well.  She did not have hepatic function tests normal lipase drawn in triage said that it those on.  I will evaluate with a CT scan of her abdomen and pelvis.  I believe that she should have a repeat troponin given the near syncopal episode as it may be an anginal equivalent but if her evaluation is reassuring she should be able to be discharged and follow-up as an outpatient.  Clinical Course as of Nov 14 2  Mon Nov 13, 2016  0003 The patient has diverticulitis.  I am checking a second troponin and I have transferred emergency department care to Dr. Dahlia Client to follow up on the troponin.  I discussed the plan with the patient and she does want to go home and is comfortable with the plan for outpatient antibiotics.  A believe that she had a vasovagal episode or possibly just overexerted herself and she agrees with that.  She will drink plenty of fluids.  [CF]    Clinical Course User Index [CF] Hinda Kehr, MD    ____________________________________________  FINAL CLINICAL IMPRESSION(S) / ED DIAGNOSES  Final diagnoses:  Near syncope  Acute diverticulitis     MEDICATIONS GIVEN DURING THIS  VISIT:  Medications  iopamidol (ISOVUE-300) 61 % injection 30 mL (30 mLs Oral Contrast Given 11/12/16 2242)  iopamidol (ISOVUE-300) 61 % injection 100 mL (100 mLs Intravenous Contrast Given 11/12/16 2305)     NEW OUTPATIENT MEDICATIONS STARTED DURING THIS VISIT:  New Prescriptions   CIPROFLOXACIN (CIPRO) 500 MG TABLET    Take 1 tablet (500 mg total) by mouth 2 (two) times daily.   METRONIDAZOLE (FLAGYL) 500 MG TABLET    Take 1 tablet (500 mg total) by mouth 3 (three) times daily.    Modified Medications   No medications on file    Discontinued Medications   No medications on file     Note:  This document was prepared using Dragon voice recognition software and may include unintentional dictation errors.    Hinda Kehr, MD 11/12/16 1017    Hinda Kehr, MD 11/13/16 5102

## 2016-11-12 NOTE — ED Triage Notes (Signed)
Pt arrived to ED with c/o near syncope episodes that occurred 2x today. Pt became dizzy and diaphoretic. Pt is as diabetic and checked her CBG and states it was 225 approx 30 mins ago. Pt also states she has been having LLQ pain that has been ongoing for about 3 weeks.

## 2016-11-12 NOTE — ED Notes (Signed)
Santiago Glad, CT, called d/t pt finishing contrast

## 2016-11-13 LAB — TROPONIN I

## 2016-11-13 MED ORDER — METRONIDAZOLE 500 MG PO TABS: 500.0000 mg | ORAL_TABLET | Freq: Three times a day (TID) | ORAL | 0 refills | Status: AC

## 2016-11-13 MED ORDER — CIPROFLOXACIN HCL 500 MG PO TABS: 500.0000 mg | ORAL_TABLET | Freq: Two times a day (BID) | ORAL | 0 refills | Status: AC

## 2016-11-13 NOTE — Discharge Instructions (Signed)
We believe your symptoms are caused by diverticulitis.  Most of the time this condition (please read through the included information) can be cured with outpatient antibiotics.  Please take the full course of prescribed medication(s) and follow up with the doctors recommended above.  Return to the ED if your abdominal pain worsens or fails to improve, you develop bloody vomiting, bloody diarrhea, you are unable to tolerate fluids due to vomiting, fever greater than 101, or other symptoms that concern you.    

## 2016-11-13 NOTE — ED Provider Notes (Signed)
-----------------------------------------   12:44 AM on 11/13/2016 -----------------------------------------   Blood pressure (!) 157/82, pulse 60, temperature 98.2 F (36.8 C), temperature source Oral, resp. rate 16, height 5\' 10"  (1.778 m), weight 225 lb (102.1 kg), SpO2 97 %.  Assuming care from Dr. Karma Greaser.  In short, Ann Larson is a 61 y.o. female with a chief complaint of Abdominal Pain .  Refer to the original H&P for additional details.  The current plan of care is to follow-up the results of the repeat troponin and disposition the patient.   Clinical Course as of Nov 14 42  Mon Nov 13, 2016  0003 The patient has diverticulitis.  I am checking a second troponin and I have transferred emergency department care to Dr. Dahlia Client to follow up on the troponin.  I discussed the plan with the patient and she does want to go home and is comfortable with the plan for outpatient antibiotics.  A believe that she had a vasovagal episode or possibly just overexerted herself and she agrees with that.  She will drink plenty of fluids.  [CF]    Clinical Course User Index [CF] Hinda Kehr, MD   The patient's troponin is negative. She'll be discharged home to follow-up with her primary care physician.   Loney Hering, MD 11/13/16 [phone]

## 2017-02-06 ENCOUNTER — Emergency Department
Admission: EM | Admit: 2017-02-06 | Discharge: 2017-02-07 | Disposition: A | Discharge status: Home / Self Care | Payer: BLUE CROSS/BLUE SHIELD | Attending: Emergency Medicine | Admitting: Emergency Medicine

## 2017-02-06 ENCOUNTER — Encounter: Payer: Self-pay | Admitting: Emergency Medicine

## 2017-02-06 DIAGNOSIS — R42 Dizziness and giddiness: Secondary | ICD-10-CM | POA: Diagnosis not present

## 2017-02-06 DIAGNOSIS — R1031 Right lower quadrant pain: Secondary | ICD-10-CM | POA: Diagnosis present

## 2017-02-06 DIAGNOSIS — R197 Diarrhea, unspecified: Secondary | ICD-10-CM | POA: Insufficient documentation

## 2017-02-06 DIAGNOSIS — Z79899 Other long term (current) drug therapy: Secondary | ICD-10-CM | POA: Insufficient documentation

## 2017-02-06 DIAGNOSIS — R109 Unspecified abdominal pain: Secondary | ICD-10-CM

## 2017-02-06 DIAGNOSIS — K869 Disease of pancreas, unspecified: Secondary | ICD-10-CM

## 2017-02-06 DIAGNOSIS — R110 Nausea: Secondary | ICD-10-CM | POA: Diagnosis not present

## 2017-02-06 DIAGNOSIS — E119 Type 2 diabetes mellitus without complications: Secondary | ICD-10-CM | POA: Diagnosis not present

## 2017-02-06 DIAGNOSIS — Z7984 Long term (current) use of oral hypoglycemic drugs: Secondary | ICD-10-CM | POA: Diagnosis not present

## 2017-02-06 DIAGNOSIS — E039 Hypothyroidism, unspecified: Secondary | ICD-10-CM | POA: Diagnosis not present

## 2017-02-06 DIAGNOSIS — K8500 Idiopathic acute pancreatitis without necrosis or infection: Secondary | ICD-10-CM

## 2017-02-06 HISTORY — DX: Diverticulitis of intestine, part unspecified, without perforation or abscess without bleeding: K57.92

## 2017-02-06 LAB — TROPONIN I: Troponin I: <0.03 ng/mL (ref <0.03)

## 2017-02-06 LAB — URINALYSIS, COMPLETE (UACMP) WITH MICROSCOPIC
Bilirubin Urine: NEGATIVE
Glucose, UA: 500 mg/dL — AB
Hgb urine dipstick: NEGATIVE
Ketones, ur: NEGATIVE mg/dL
LEUKOCYTES UA: NEGATIVE
Nitrite: NEGATIVE
Protein, ur: NEGATIVE mg/dL
Specific Gravity, Urine: 1.02 (ref 1.005–1.030)
pH: 5 (ref 5.0–8.0)

## 2017-02-06 LAB — CBC
HCT: 41.2 % (ref 35.0–47.0)
HEMOGLOBIN: 13.9 g/dL (ref 12.0–16.0)
MCH: 29.7 pg (ref 26.0–34.0)
MCHC: 33.6 g/dL (ref 32.0–36.0)
MCV: 88.4 fL (ref 80.0–100.0)
PLATELETS: 262 10*3/uL (ref 150–440)
RBC: 4.67 MIL/uL (ref 3.80–5.20)
RDW: 15 % — ABNORMAL HIGH (ref 11.5–14.5)
WBC: 6.6 10*3/uL (ref 3.6–11.0)

## 2017-02-06 LAB — BASIC METABOLIC PANEL
ANION GAP: 7 (ref 5–15)
BUN: 19 mg/dL (ref 6–20)
CALCIUM: 11.2 mg/dL — AB (ref 8.9–10.3)
CHLORIDE: 104 mmol/L (ref 101–111)
CO2: 26 mmol/L (ref 22–32)
CREATININE: 0.99 mg/dL (ref 0.44–1.00)
GFR calc non Af Amer: >60 mL/min (ref >60)
Glucose, Bld: 164 mg/dL — ABNORMAL HIGH (ref 65–99)
Potassium: 3.7 mmol/L (ref 3.5–5.1)
SODIUM: 137 mmol/L (ref 135–145)

## 2017-02-06 LAB — LIPASE, BLOOD: Lipase: 35 U/L (ref 11–51)

## 2017-02-06 MED ORDER — MECLIZINE HCL 25 MG PO TABS
25.0000 mg | ORAL_TABLET | Freq: Once | ORAL | Status: AC
  Administered 2017-02-06: 25 mg via ORAL
  Filled 2017-02-06: qty 1

## 2017-02-06 MED ORDER — IOPAMIDOL (ISOVUE-300) INJECTION 61%
30.0000 mL | Freq: Once | INTRAVENOUS | Status: AC
  Administered 2017-02-07: 30 mL via ORAL

## 2017-02-06 MED ORDER — SODIUM CHLORIDE 0.9 % IV BOLUS (SEPSIS)
1000.0000 mL | Freq: Once | INTRAVENOUS | Status: AC
  Administered 2017-02-06: 1000 mL via INTRAVENOUS

## 2017-02-06 NOTE — ED Triage Notes (Signed)
Patient with complaint of dizziness and nausea that started about 13:30 today.

## 2017-02-06 NOTE — ED Notes (Signed)
Pt reports that she has had dizziness and nausea since 130pm today - c/o right lower quad pain with no radiation - pt reports pain with bowel movement on the right lower quad (this has been happening for 2 weeks) - pt states her BM's are normal - she reports that immediately after eating she has to have BM - pt is passing gas without difficulty

## 2017-02-06 NOTE — ED Provider Notes (Addendum)
[facility] Emergency Department Provider Note  ____________________________________________   First MD Initiated Contact with Patient 02/06/17 2227     (approximate)  I have reviewed the triage vital signs and the nursing notes.   HISTORY  Chief Complaint Dizziness and Nausea   HPI Ann Larson is a 61 y.o. female with a history of depression, diabetes and diverticulitis who is presenting to the emergency department today with 2 weeks of right lower quadrant abdominal pain as well as diarrhea. She says that she has 1-2 episodes of diarrhea per day. She denies any blood in her stool. Also with nausea over the past 2 weeks but without any vomiting. She presented to the emergency department today because about 1:00 this afternoon she started to become dizzy with a spinning sensation. She says that this was worsened with movement of her head from side to side. She said that she had an episode of vertigo years ago but said it was more of a sudden onset issue. She is concerned because she thinks she will not sleep the rest tonight because she says that the vertiginous sensation gets worse when she lays down. She says that she is having diffuse "soreness" to her abdomen but that the right lower quadrant pain usually improves after moving her bowels. No recent travel or antibiotics.Patient also denies any recent ear ringing or pressure.   Past Medical History:  Diagnosis Date  . Depression   . Diabetes mellitus without complication ([place])   . Diverticulitis   . GERD (gastroesophageal reflux disease)   . Hyperlipidemia   . Hypertension   . Hypothyroidism   . Obesity     There are no active problems to display for this patient.   Past Surgical History:  Procedure Laterality Date  . ABDOMINAL HYSTERECTOMY  2005  . CHOLECYSTECTOMY  2015  . COLONOSCOPY WITH PROPOFOL N/A 07/01/2015   Procedure: COLONOSCOPY WITH PROPOFOL;  Surgeon: Josefine Class, MD;   Location: [facility] ENDOSCOPY;  Service: Endoscopy;  Laterality: N/A;  . ESOPHAGOGASTRODUODENOSCOPY (EGD) WITH PROPOFOL N/A 07/01/2015   Procedure: ESOPHAGOGASTRODUODENOSCOPY (EGD) WITH PROPOFOL;  Surgeon: Josefine Class, MD;  Location: [facility] ENDOSCOPY;  Service: Endoscopy;  Laterality: N/A;    Prior to Admission medications   Medication Sig Start Date End Date Taking? Authorizing Provider  levothyroxine (SYNTHROID, LEVOTHROID) 125 MCG tablet Take 125 mcg by mouth daily before breakfast.    [provider]  lisinopril (PRINIVIL,ZESTRIL) 10 MG tablet Take 10 mg by mouth daily.    [provider]  metFORMIN (GLUMETZA) 500 MG (MOD) 24 hr tablet Take 500 mg by mouth daily with supper.    [provider]  metoprolol succinate (TOPROL-XL) 100 MG 24 hr tablet Take 100 mg by mouth daily. Take with or immediately following a meal.    [provider]  omeprazole (PRILOSEC) 20 MG capsule Take 20 mg by mouth daily.    [provider]  polyethylene glycol-electrolytes (NULYTELY/GOLYTELY) 420 G solution Take 4,000 mLs by mouth once.    [provider]  simvastatin (ZOCOR) 20 MG tablet Take 20 mg by mouth daily.    [provider]  venlafaxine XR (EFFEXOR-XR) 75 MG 24 hr capsule Take 75 mg by mouth daily with breakfast.    [provider]    Allergies Sulfa antibiotics  No family history on file.  Social History Social History  Substance Use Topics  . Smoking status: Never Smoker  . Smokeless tobacco: Never Used  . Alcohol  use No    Review of Systems  Constitutional: No fever/chills Eyes: No visual changes. ENT: No sore throat. Cardiovascular: Denies chest pain. Respiratory: Denies shortness of breath. Gastrointestinal:  no vomiting.    No constipation. Genitourinary: Negative for dysuria. Musculoskeletal: Negative for back pain. Skin: Negative for rash. Neurological: Negative for headaches, focal weakness or  numbness.   ____________________________________________   PHYSICAL EXAM:  VITAL SIGNS: ED Triage Vitals  Enc Vitals Group     BP 02/06/17 1920 125/73     Pulse Rate 02/06/17 1920 66     Resp 02/06/17 1920 18     Temp 02/06/17 1920 98.3 F (36.8 C)     Temp Source 02/06/17 1920 Oral     SpO2 02/06/17 1920 96 %     Weight 02/06/17 1920 230 lb (104.3 kg)     Height 02/06/17 1920 5\' 10"  (1.778 m)     Head Circumference --      Peak Flow --      Pain Score 02/06/17 1919 3     Pain Loc --      Pain Edu? --      Excl. in [place]? --     Constitutional: Alert and oriented. Well appearing and in no acute distress. Eyes: Conjunctivae are normal. No nystagmus. Head: Atraumatic.TMs are normal bilaterally. Nose: No congestion/rhinnorhea. Mouth/Throat: Mucous membranes are moist.  Neck: No stridor.   Cardiovascular: Normal rate, regular rhythm. Grossly normal heart sounds.   Respiratory: Normal respiratory effort.  No retractions. Lungs CTAB. Gastrointestinal: Soft with moderate right lower quadrant tenderness palpation without any rebound or guarding. Also with diffuse and mild tenderness to the other 3 quadrants of the abdomen. No distention.  Musculoskeletal: No lower extremity tenderness nor edema.  No joint effusions. Neurologic:  Normal speech and language. No gross focal neurologic deficits are appreciated. Skin:  Skin is warm, dry and intact. No rash noted. Psychiatric: Mood and affect are normal. Speech and behavior are normal.  ____________________________________________   LABS (all labs ordered are listed, but only abnormal results are displayed)  Labs Reviewed  BASIC METABOLIC PANEL - Abnormal; Notable for the following:       Result Value   Glucose, Bld 164 (*)    Calcium 11.2 (*)    All other components within normal limits  CBC - Abnormal; Notable for the following:    RDW 15.0 (*)    All other components within normal limits  TROPONIN I  LIPASE, BLOOD   URINALYSIS, COMPLETE (UACMP) WITH MICROSCOPIC  CBG MONITORING, ED   ____________________________________________  EKG  ED ECG REPORT I, Doran Stabler, the attending physician, personally viewed and interpreted this ECG.   Date: 02/06/2017  EKG Time: 1925  Rate: 65  Rhythm: normal sinus rhythm  Axis: normal  Intervals:none  ST&T Change: No ST segment elevation or pressure. Single T-wave inversion in aVL.  ____________________________________________  RADIOLOGY  Pending ct abd ____________________________________________   PROCEDURES  Procedure(s) performed:   Procedures  Critical Care performed:   ____________________________________________   INITIAL IMPRESSION / ASSESSMENT AND PLAN / ED COURSE  Pertinent labs & imaging results that were available during my care of the patient were reviewed by me and considered in my medical decision making (see chart for details).   ----------------------------------------- 11:54 PM on 02/06/2017 ----------------------------------------- Patient with history and physical consistent with peripheral vertigo. Possibly volume depleted. Patient to be reassessed after fluids and meclizine. Also with CT of the abdomen secondary to recent bout of  diverticulitis as well as right lower quadrant pain.       ____________________________________________   FINAL CLINICAL IMPRESSION(S) / ED DIAGNOSES  Diarrhea. Abdominal pain. Vertigo.    NEW MEDICATIONS STARTED DURING THIS VISIT:  New Prescriptions   No medications on file     Note:  This document was prepared using Dragon voice recognition software and may include unintentional dictation errors.     Orbie Pyo, MD 02/06/17 1700    Orbie Pyo, MD 02/06/17 [phone]

## 2017-02-07 ENCOUNTER — Emergency Department: Payer: BLUE CROSS/BLUE SHIELD

## 2017-02-07 ENCOUNTER — Encounter: Payer: Self-pay | Admitting: Radiology

## 2017-02-07 MED ORDER — IOPAMIDOL (ISOVUE-300) INJECTION 61%
100.0000 mL | Freq: Once | INTRAVENOUS | Status: AC | PRN
  Administered 2017-02-07: 100 mL via INTRAVENOUS

## 2017-02-07 MED ORDER — ONDANSETRON 4 MG PO TBDP: 4.0000 mg | ORAL_TABLET | Freq: Three times a day (TID) | ORAL | 0 refills | Status: DC | PRN

## 2017-02-13 ENCOUNTER — Ambulatory Visit: Payer: BLUE CROSS/BLUE SHIELD | Attending: Neurology

## 2017-02-13 DIAGNOSIS — G4733 Obstructive sleep apnea (adult) (pediatric): Secondary | ICD-10-CM | POA: Insufficient documentation

## 2017-02-13 DIAGNOSIS — G4761 Periodic limb movement disorder: Secondary | ICD-10-CM | POA: Insufficient documentation

## 2017-03-21 ENCOUNTER — Ambulatory Visit: Payer: BLUE CROSS/BLUE SHIELD | Attending: Family Medicine

## 2017-03-21 DIAGNOSIS — G4733 Obstructive sleep apnea (adult) (pediatric): Secondary | ICD-10-CM | POA: Diagnosis not present

## 2017-08-29 ENCOUNTER — Other Ambulatory Visit: Payer: Self-pay

## 2017-08-29 ENCOUNTER — Encounter: Payer: Self-pay | Admitting: Emergency Medicine

## 2017-08-29 ENCOUNTER — Emergency Department: Payer: BLUE CROSS/BLUE SHIELD

## 2017-08-29 ENCOUNTER — Emergency Department
Admission: EM | Admit: 2017-08-29 | Discharge: 2017-08-29 | Disposition: A | Discharge status: Home / Self Care | Payer: BLUE CROSS/BLUE SHIELD | Attending: Emergency Medicine | Admitting: Emergency Medicine

## 2017-08-29 DIAGNOSIS — I82411 Acute embolism and thrombosis of right femoral vein: Secondary | ICD-10-CM | POA: Insufficient documentation

## 2017-08-29 DIAGNOSIS — O2230 Deep phlebothrombosis in pregnancy, unspecified trimester: Secondary | ICD-10-CM

## 2017-08-29 DIAGNOSIS — E039 Hypothyroidism, unspecified: Secondary | ICD-10-CM | POA: Insufficient documentation

## 2017-08-29 DIAGNOSIS — I82431 Acute embolism and thrombosis of right popliteal vein: Secondary | ICD-10-CM | POA: Diagnosis not present

## 2017-08-29 DIAGNOSIS — Z7984 Long term (current) use of oral hypoglycemic drugs: Secondary | ICD-10-CM | POA: Insufficient documentation

## 2017-08-29 DIAGNOSIS — I824Z1 Acute embolism and thrombosis of unspecified deep veins of right distal lower extremity: Secondary | ICD-10-CM

## 2017-08-29 DIAGNOSIS — E119 Type 2 diabetes mellitus without complications: Secondary | ICD-10-CM | POA: Diagnosis not present

## 2017-08-29 DIAGNOSIS — Z79899 Other long term (current) drug therapy: Secondary | ICD-10-CM | POA: Diagnosis not present

## 2017-08-29 DIAGNOSIS — M7989 Other specified soft tissue disorders: Secondary | ICD-10-CM

## 2017-08-29 DIAGNOSIS — M79604 Pain in right leg: Secondary | ICD-10-CM

## 2017-08-29 DIAGNOSIS — I10 Essential (primary) hypertension: Secondary | ICD-10-CM | POA: Diagnosis not present

## 2017-08-29 DIAGNOSIS — M79661 Pain in right lower leg: Secondary | ICD-10-CM | POA: Diagnosis present

## 2017-08-29 HISTORY — DX: Deep phlebothrombosis in pregnancy, unspecified trimester: O22.30

## 2017-08-29 MED ORDER — RIVAROXABAN (XARELTO) VTE STARTER PACK (15 & 20 MG): ORAL_TABLET | ORAL | 0 refills | Status: DC

## 2017-08-29 NOTE — Discharge Instructions (Signed)
Please seek medical attention for any high fevers, chest pain, shortness of breath, change in behavior, persistent vomiting, bloody stool or any other new or concerning symptoms.  

## 2017-08-29 NOTE — ED Notes (Signed)
Pt reports that PCP sent her to the ED today for ? Blood clot in right lower leg - right lower leg is swollen/warm to touch and painful to ambulate/bear weight

## 2017-08-29 NOTE — ED Triage Notes (Addendum)
First Nurse Note: Pt to ed with c/o right lower leg pain and swelling x 3 days, denies sob, denies difficulty breathing even with exertion.  Pt states she was sent here by Dr. Bernita Buffy to have evaluation for ? Blood clot in leg

## 2017-08-29 NOTE — ED Provider Notes (Signed)
[facility] Emergency Department Provider Note   ____________________________________________   I have reviewed the triage vital signs and the nursing notes.   HISTORY  Chief Complaint Leg pain and swelling  History limited by: Not Limited   HPI Ann Larson is a 62 y.o. female who presents to the emergency department today sent by PCP because of blood clot of right lower extremity. Patient's main complaint is leg swelling and pain.   LOCATION:right calf DURATION:3 days TIMING: constant SEVERITY: severe CONTEXT: patient denies any trauma to the leg or any recent travel. No history of blood clots. Went to PCP office where US showed blood clot. Was told to come to the emergency department today for further tests but is not sure what further tests PCP desired. Denies any history of GI bleed. Denies any intracranial bleed.  MODIFYING FACTORS: pain is worse with walking ASSOCIATED SYMPTOMS: has had swelling in the right leg. Denies any new shortness of breath or chest pain.  Per medical record review patient has a history of DM.  Past Medical History:  Diagnosis Date  . Depression   . Diabetes mellitus without complication ([place])   . Diverticulitis   . GERD (gastroesophageal reflux disease)   . Hyperlipidemia   . Hypertension   . Hypothyroidism   . Obesity     There are no active problems to display for this patient.   Past Surgical History:  Procedure Laterality Date  . ABDOMINAL HYSTERECTOMY  2005  . CHOLECYSTECTOMY  2015  . COLONOSCOPY WITH PROPOFOL N/A 07/01/2015   Procedure: COLONOSCOPY WITH PROPOFOL;  Surgeon: Josefine Class, MD;  Location: [facility] ENDOSCOPY;  Service: Endoscopy;  Laterality: N/A;  . ESOPHAGOGASTRODUODENOSCOPY (EGD) WITH PROPOFOL N/A 07/01/2015   Procedure: ESOPHAGOGASTRODUODENOSCOPY (EGD) WITH PROPOFOL;  Surgeon: Josefine Class, MD;  Location: [facility] ENDOSCOPY;  Service: Endoscopy;  Laterality: N/A;     Prior to Admission medications   Medication Sig Start Date End Date Taking? Authorizing Provider  levothyroxine (SYNTHROID, LEVOTHROID) 125 MCG tablet Take 125 mcg by mouth daily before breakfast.    [provider]  lisinopril (PRINIVIL,ZESTRIL) 10 MG tablet Take 10 mg by mouth daily.    [provider]  metFORMIN (GLUMETZA) 500 MG (MOD) 24 hr tablet Take 500 mg by mouth daily with supper.    [provider]  metoprolol succinate (TOPROL-XL) 100 MG 24 hr tablet Take 100 mg by mouth daily. Take with or immediately following a meal.    [provider]  omeprazole (PRILOSEC) 20 MG capsule Take 20 mg by mouth daily.    [provider]  ondansetron (ZOFRAN ODT) 4 MG disintegrating tablet Take 1 tablet (4 mg total) by mouth every 8 (eight) hours as needed for nausea or vomiting. 02/07/17   Gregor Hams, MD  polyethylene glycol-electrolytes (NULYTELY/GOLYTELY) 420 G solution Take 4,000 mLs by mouth once.    [provider]  simvastatin (ZOCOR) 20 MG tablet Take 20 mg by mouth daily.    [provider]  venlafaxine XR (EFFEXOR-XR) 75 MG 24 hr capsule Take 75 mg by mouth daily with breakfast.    [provider]    Allergies Sulfa antibiotics  History reviewed. No pertinent family history.  Social History Social History   Tobacco Use  . Smoking status: Never Smoker  . Smokeless tobacco: Never Used  Substance Use Topics  . Alcohol use: No  . Drug use: No    Review of Systems Constitutional: Positive for chills.  Eyes:  No visual changes. ENT: No sore throat. Cardiovascular: Denies chest pain. Respiratory: Positive for chronic shortness of breath, no recent worsening. Gastrointestinal: No abdominal pain.  No nausea, no vomiting.  No diarrhea.  Genitourinary: Negative for dysuria. Musculoskeletal: Positive for right leg swelling and pain.  Skin: Negative for rash. Neurological: Negative for headaches, focal  weakness or numbness.  ____________________________________________   PHYSICAL EXAM:  VITAL SIGNS: ED Triage Vitals  Enc Vitals Group     BP 08/29/17 1234 (!) 131/58     Pulse Rate 08/29/17 1234 75     Resp 08/29/17 1234 18     Temp 08/29/17 1234 98.9 F (37.2 C)     Temp Source 08/29/17 1234 Oral     SpO2 08/29/17 1234 96 %     Weight 08/29/17 1234 230 lb (104.3 kg)     Height --      Head Circumference --      Peak Flow --      Pain Score 08/29/17 1224 4   Constitutional: Alert and oriented. Well appearing and in no distress. Eyes: Conjunctivae are normal.  ENT   Head: Normocephalic and atraumatic.   Nose: No congestion/rhinnorhea.   Mouth/Throat: Mucous membranes are moist.   Neck: No stridor. Hematological/Lymphatic/Immunilogical: No cervical lymphadenopathy. Cardiovascular: Normal rate, regular rhythm.  No murmurs, rubs, or gallops.  Respiratory: Normal respiratory effort without tachypnea nor retractions. Breath sounds are clear and equal bilaterally. No wheezes/rales/rhonchi. Gastrointestinal: Soft and non tender. No rebound. No guarding.  Genitourinary: Deferred Musculoskeletal: Normal range of motion in all extremities. Right calf with some fullness, compartments are soft. No skin changes when compared to left leg. DP 2+ bilaterally.  Neurologic:  Normal speech and language. No gross focal neurologic deficits are appreciated.  Skin:  Skin is warm, dry and intact. No rash noted. Psychiatric: Mood and affect are normal. Speech and behavior are normal. Patient exhibits appropriate insight and judgment.  ____________________________________________    LABS (pertinent positives/negatives)  None  ____________________________________________   EKG  None  ____________________________________________    RADIOLOGY  [place] lower extremity right Positive for DVT, right calf, popliteal and distal  femoral  ____________________________________________   PROCEDURES  Procedures  ____________________________________________   INITIAL IMPRESSION / ASSESSMENT AND PLAN / ED COURSE  Pertinent labs & imaging results that were available during my care of the patient were reviewed by me and considered in my medical decision making (see chart for details).  Patient presented to the emergency department today because of concern for right DVT found on [place] at PCPs office. Stated she was sent to the ER for further testing. Discussed with patient that we typically start patient's on oral anticoagulation for DVTs without other concerning findings (no skin change, good pulse). Discussed with Dr. Brunetta Genera her PCP who was also comfortable with starting patient on oral anticoagulations. Did not specify that there were any further specific tests he wanted performed. Will give patient vascular surgery follow up information.   ____________________________________________   FINAL CLINICAL IMPRESSION(S) / ED DIAGNOSES  Final diagnoses:  Acute deep vein thrombosis (DVT) of distal vein of right lower extremity (HCC)  Leg swelling  Pain of right lower extremity     Note: This dictation was prepared with Dragon dictation. Any transcriptional errors that result from this process are unintentional     Nance Pear, MD 08/29/17 1436

## 2017-09-07 ENCOUNTER — Inpatient Hospital Stay
Admission: EM | Admit: 2017-09-07 | Discharge: 2017-09-09 | DRG: 357 | Disposition: A | Discharge status: Home / Self Care | Payer: BLUE CROSS/BLUE SHIELD | Attending: Internal Medicine | Admitting: Internal Medicine

## 2017-09-07 ENCOUNTER — Emergency Department: Payer: BLUE CROSS/BLUE SHIELD

## 2017-09-07 ENCOUNTER — Encounter: Payer: Self-pay | Admitting: *Deleted

## 2017-09-07 ENCOUNTER — Other Ambulatory Visit: Payer: Self-pay

## 2017-09-07 DIAGNOSIS — R1011 Right upper quadrant pain: Secondary | ICD-10-CM | POA: Diagnosis present

## 2017-09-07 DIAGNOSIS — R600 Localized edema: Secondary | ICD-10-CM

## 2017-09-07 DIAGNOSIS — K921 Melena: Secondary | ICD-10-CM | POA: Diagnosis present

## 2017-09-07 DIAGNOSIS — I10 Essential (primary) hypertension: Secondary | ICD-10-CM | POA: Diagnosis present

## 2017-09-07 DIAGNOSIS — E119 Type 2 diabetes mellitus without complications: Secondary | ICD-10-CM | POA: Diagnosis present

## 2017-09-07 DIAGNOSIS — T50905A Adverse effect of unspecified drugs, medicaments and biological substances, initial encounter: Secondary | ICD-10-CM

## 2017-09-07 DIAGNOSIS — I82411 Acute embolism and thrombosis of right femoral vein: Secondary | ICD-10-CM | POA: Diagnosis present

## 2017-09-07 DIAGNOSIS — E669 Obesity, unspecified: Secondary | ICD-10-CM | POA: Diagnosis present

## 2017-09-07 DIAGNOSIS — R0602 Shortness of breath: Secondary | ICD-10-CM

## 2017-09-07 DIAGNOSIS — Z90710 Acquired absence of both cervix and uterus: Secondary | ICD-10-CM

## 2017-09-07 DIAGNOSIS — Z9049 Acquired absence of other specified parts of digestive tract: Secondary | ICD-10-CM

## 2017-09-07 DIAGNOSIS — K5790 Diverticulosis of intestine, part unspecified, without perforation or abscess without bleeding: Secondary | ICD-10-CM | POA: Diagnosis present

## 2017-09-07 DIAGNOSIS — Z79890 Hormone replacement therapy (postmenopausal): Secondary | ICD-10-CM

## 2017-09-07 DIAGNOSIS — K219 Gastro-esophageal reflux disease without esophagitis: Secondary | ICD-10-CM | POA: Diagnosis present

## 2017-09-07 DIAGNOSIS — E039 Hypothyroidism, unspecified: Secondary | ICD-10-CM | POA: Diagnosis present

## 2017-09-07 DIAGNOSIS — Z7984 Long term (current) use of oral hypoglycemic drugs: Secondary | ICD-10-CM

## 2017-09-07 DIAGNOSIS — I82431 Acute embolism and thrombosis of right popliteal vein: Secondary | ICD-10-CM | POA: Diagnosis present

## 2017-09-07 DIAGNOSIS — D6832 Hemorrhagic disorder due to extrinsic circulating anticoagulants: Secondary | ICD-10-CM | POA: Diagnosis present

## 2017-09-07 DIAGNOSIS — T45515A Adverse effect of anticoagulants, initial encounter: Secondary | ICD-10-CM | POA: Diagnosis present

## 2017-09-07 DIAGNOSIS — R195 Other fecal abnormalities: Secondary | ICD-10-CM | POA: Diagnosis not present

## 2017-09-07 DIAGNOSIS — D62 Acute posthemorrhagic anemia: Secondary | ICD-10-CM | POA: Diagnosis present

## 2017-09-07 DIAGNOSIS — E785 Hyperlipidemia, unspecified: Secondary | ICD-10-CM | POA: Diagnosis present

## 2017-09-07 DIAGNOSIS — I82401 Acute embolism and thrombosis of unspecified deep veins of right lower extremity: Secondary | ICD-10-CM | POA: Diagnosis not present

## 2017-09-07 DIAGNOSIS — I82409 Acute embolism and thrombosis of unspecified deep veins of unspecified lower extremity: Secondary | ICD-10-CM | POA: Diagnosis not present

## 2017-09-07 DIAGNOSIS — F329 Major depressive disorder, single episode, unspecified: Secondary | ICD-10-CM | POA: Diagnosis present

## 2017-09-07 DIAGNOSIS — Z6831 Body mass index (BMI) 31.0-31.9, adult: Secondary | ICD-10-CM | POA: Diagnosis not present

## 2017-09-07 DIAGNOSIS — Z882 Allergy status to sulfonamides status: Secondary | ICD-10-CM

## 2017-09-07 DIAGNOSIS — D649 Anemia, unspecified: Secondary | ICD-10-CM

## 2017-09-07 DIAGNOSIS — K922 Gastrointestinal hemorrhage, unspecified: Secondary | ICD-10-CM | POA: Diagnosis present

## 2017-09-07 HISTORY — DX: Deep phlebothrombosis in pregnancy, unspecified trimester: O22.30

## 2017-09-07 LAB — BASIC METABOLIC PANEL
Anion gap: 8 (ref 5–15)
BUN: 15 mg/dL (ref 6–20)
CHLORIDE: 105 mmol/L (ref 101–111)
CO2: 24 mmol/L (ref 22–32)
CREATININE: 0.84 mg/dL (ref 0.44–1.00)
Calcium: 10.2 mg/dL (ref 8.9–10.3)
GFR calc Af Amer: >60 mL/min (ref >60)
GFR calc non Af Amer: >60 mL/min (ref >60)
Glucose, Bld: 176 mg/dL — ABNORMAL HIGH (ref 65–99)
Potassium: 3.9 mmol/L (ref 3.5–5.1)
SODIUM: 137 mmol/L (ref 135–145)

## 2017-09-07 LAB — HEMOGLOBIN A1C
Hgb A1c MFr Bld: 6.2 % — ABNORMAL HIGH (ref 4.8–5.6)
MEAN PLASMA GLUCOSE: 131.24 mg/dL

## 2017-09-07 LAB — CBC
HCT: 22.3 % — ABNORMAL LOW (ref 35.0–47.0)
Hemoglobin: 7.4 g/dL — ABNORMAL LOW (ref 12.0–16.0)
MCH: 30.8 pg (ref 26.0–34.0)
MCHC: 33.1 g/dL (ref 32.0–36.0)
MCV: 93.2 fL (ref 80.0–100.0)
PLATELETS: 372 10*3/uL (ref 150–440)
RBC: 2.4 MIL/uL — ABNORMAL LOW (ref 3.80–5.20)
RDW: 15.2 % — AB (ref 11.5–14.5)
WBC: 8.8 10*3/uL (ref 3.6–11.0)

## 2017-09-07 LAB — GLUCOSE, CAPILLARY
GLUCOSE-CAPILLARY: 123 mg/dL — AB (ref 65–99)
GLUCOSE-CAPILLARY: 128 mg/dL — AB (ref 65–99)

## 2017-09-07 LAB — TROPONIN I: Troponin I: <0.03 ng/mL (ref <0.03)

## 2017-09-07 MED ORDER — SODIUM CHLORIDE 0.9% FLUSH
3.0000 mL | Freq: Two times a day (BID) | INTRAVENOUS | Status: DC
  Administered 2017-09-07 – 2017-09-09 (×4): 3 mL via INTRAVENOUS

## 2017-09-07 MED ORDER — INSULIN ASPART 100 UNIT/ML ~~LOC~~ SOLN
0.0000 [IU] | Freq: Three times a day (TID) | SUBCUTANEOUS | Status: DC
  Administered 2017-09-07: 2 [IU] via SUBCUTANEOUS
  Administered 2017-09-08: 18:00:00 3 [IU] via SUBCUTANEOUS
  Administered 2017-09-08: 10:00:00 2 [IU] via SUBCUTANEOUS
  Administered 2017-09-09: 3 [IU] via SUBCUTANEOUS
  Filled 2017-09-07 (×4): qty 1

## 2017-09-07 MED ORDER — INSULIN ASPART 100 UNIT/ML ~~LOC~~ SOLN: 0.0000 [IU] | Freq: Every day | SUBCUTANEOUS | Status: DC

## 2017-09-07 MED ORDER — POLYETHYLENE GLYCOL 3350 17 G PO PACK
17.0000 g | PACK | Freq: Every day | ORAL | Status: DC | PRN
  Administered 2017-09-09: 17 g via ORAL
  Filled 2017-09-07: qty 1

## 2017-09-07 MED ORDER — DEXTROSE 5 % IV SOLN
1.5000 g | INTRAVENOUS | Status: DC
  Filled 2017-09-07: qty 1.5

## 2017-09-07 MED ORDER — VENLAFAXINE HCL ER 75 MG PO CP24
75.0000 mg | ORAL_CAPSULE | Freq: Every day | ORAL | Status: DC
Start: 2017-09-08 — End: 2017-09-09
  Administered 2017-09-08: 22:00:00 75 mg via ORAL
  Filled 2017-09-07: qty 1

## 2017-09-07 MED ORDER — ALBUTEROL SULFATE (2.5 MG/3ML) 0.083% IN NEBU: 2.5000 mg | INHALATION_SOLUTION | RESPIRATORY_TRACT | Status: DC | PRN

## 2017-09-07 MED ORDER — ACETAMINOPHEN 325 MG PO TABS: 650.0000 mg | ORAL_TABLET | Freq: Four times a day (QID) | ORAL | Status: DC | PRN

## 2017-09-07 MED ORDER — PANTOPRAZOLE SODIUM 40 MG IV SOLR
40.0000 mg | Freq: Two times a day (BID) | INTRAVENOUS | Status: DC
  Administered 2017-09-07 – 2017-09-09 (×4): 40 mg via INTRAVENOUS
  Filled 2017-09-07 (×4): qty 40

## 2017-09-07 MED ORDER — INSULIN GLARGINE 100 UNIT/ML ~~LOC~~ SOLN
20.0000 [IU] | Freq: Every day | SUBCUTANEOUS | Status: DC
Start: 2017-09-07 — End: 2017-09-09
  Administered 2017-09-07 – 2017-09-08 (×2): 20 [IU] via SUBCUTANEOUS
  Filled 2017-09-07 (×3): qty 0.2

## 2017-09-07 MED ORDER — LEVOTHYROXINE SODIUM 100 MCG PO TABS
125.0000 ug | ORAL_TABLET | Freq: Every day | ORAL | Status: DC
  Administered 2017-09-09: 125 ug via ORAL
  Filled 2017-09-07: qty 1

## 2017-09-07 MED ORDER — ONDANSETRON HCL 4 MG PO TABS
4.0000 mg | ORAL_TABLET | Freq: Four times a day (QID) | ORAL | Status: DC | PRN
Start: 2017-09-07 — End: 2017-09-09

## 2017-09-07 MED ORDER — ACETAMINOPHEN 650 MG RE SUPP: 650.0000 mg | Freq: Four times a day (QID) | RECTAL | Status: DC | PRN

## 2017-09-07 MED ORDER — TRAMADOL HCL 50 MG PO TABS
50.0000 mg | ORAL_TABLET | Freq: Four times a day (QID) | ORAL | Status: DC | PRN
  Administered 2017-09-09: 12:00:00 50 mg via ORAL
  Filled 2017-09-07: qty 1

## 2017-09-07 MED ORDER — ONDANSETRON HCL 4 MG/2ML IJ SOLN: 4.0000 mg | Freq: Four times a day (QID) | INTRAMUSCULAR | Status: DC | PRN

## 2017-09-07 MED ORDER — METOPROLOL SUCCINATE ER 50 MG PO TB24
50.0000 mg | ORAL_TABLET | Freq: Every day | ORAL | Status: DC
  Administered 2017-09-09: 10:00:00 50 mg via ORAL
  Filled 2017-09-07: qty 1

## 2017-09-07 NOTE — ED Triage Notes (Signed)
PT to ED reporting SOB and weakness with noted dizziness and lightheadedness. pT is currently on Xerelto for DVT treatment and reports since beginning the medication she has noted black stools. Pt's tongue is very pale in triage. Pt also reports chest pain upon exertion.

## 2017-09-07 NOTE — ED Notes (Signed)
Transport patient to rm 117.AS

## 2017-09-07 NOTE — H&P (Signed)
[place] [facility] [place] NAME: Ann Larson    MR#:  [id number]  DATE OF BIRTH:  12-11-1955  DATE OF ADMISSION:  09/07/2017  PRIMARY CARE PHYSICIAN: Lorelee Market, MD   REQUESTING/REFERRING PHYSICIAN: Dr. Cinda Quest  CHIEF COMPLAINT:   Chief Complaint  Patient presents with  . Weakness  . Shortness of Breath    HISTORY OF PRESENT ILLNESS:  Ann Larson  is a 62 y.o. female with a known history of diabetes, hypertension, gastritis, diverticulosis with recently diagnosed right popliteal and distal femoral DVT on Xarelto return to the emergency room due to fatigue, dizziness and shortness of breath.  Her hemoglobin is 7.4 with baseline hemoglobin at 13.  She has noticed melena.  Here in the emergency room patient stool is positive for blood.  Patient is being admitted for GI bleed. She has no chest pain, orthopnea.  She had right calf tenderness on recent ER presentation which has resolved. Patient had EGD and colonoscopy in the past with polyps, diverticulosis and gastritis.  Also history of pancreatitis.  No prior GI bleed problems.  She does have chronic right upper quadrant and epigastric pain on and off.  This is worse with eating.  She has had cholecystectomy.  PAST MEDICAL HISTORY:   Past Medical History:  Diagnosis Date  . Depression   . Diabetes mellitus without complication ([place])   . Diverticulitis   . DVT (deep vein thrombosis) in pregnancy ([place]) 08/29/2017   Right popliteal and femoral  . GERD (gastroesophageal reflux disease)   . Hyperlipidemia   . Hypertension   . Hypothyroidism   . Obesity     PAST SURGICAL HISTORY:   Past Surgical History:  Procedure Laterality Date  . ABDOMINAL HYSTERECTOMY  2005  . CHOLECYSTECTOMY  2015  . COLONOSCOPY WITH PROPOFOL N/A 07/01/2015   Procedure: COLONOSCOPY WITH PROPOFOL;  Surgeon: Josefine Class, MD;  Location[facility] ENDOSCOPY;  Service: Endoscopy;  Laterality: N/A;  .  ESOPHAGOGASTRODUODENOSCOPY (EGD) WITH PROPOFOL N/A 07/01/2015   Procedure: ESOPHAGOGASTRODUODENOSCOPY (EGD) WITH PROPOFOL;  Surgeon: Josefine Class, MD;  Location: [facility] ENDOSCOPY;  Service: Endoscopy;  Laterality: N/A;    SOCIAL HISTORY:   Social History   Tobacco Use  . Smoking status: Never Smoker  . Smokeless tobacco: Never Used  Substance Use Topics  . Alcohol use: No    FAMILY HISTORY:   Family History  Problem Relation Age of Onset  . Deep vein thrombosis Neg Hx     DRUG ALLERGIES:   Allergies  Allergen Reactions  . Sulfa Antibiotics     REVIEW OF SYSTEMS:   Review of Systems  Constitutional: Positive for malaise/fatigue. Negative for chills and fever.  HENT: Negative for sore throat.   Eyes: Negative for blurred vision, double vision and pain.  Respiratory: Negative for cough, hemoptysis, shortness of breath and wheezing.   Cardiovascular: Negative for chest pain, palpitations, orthopnea and leg swelling.  Gastrointestinal: Positive for abdominal pain and melena. Negative for constipation, diarrhea, heartburn, nausea and vomiting.  Genitourinary: Negative for dysuria and hematuria.  Musculoskeletal: Negative for back pain and joint pain.  Skin: Negative for rash.  Neurological: Positive for weakness. Negative for sensory change, speech change, focal weakness and headaches.  Endo/Heme/Allergies: Does not bruise/bleed easily.  Psychiatric/Behavioral: Negative for depression. The patient is not nervous/anxious.     MEDICATIONS AT HOME:   Prior to Admission medications   Medication Sig Start Date End Date Taking? Authorizing Provider  levothyroxine (SYNTHROID, LEVOTHROID)  125 MCG tablet Take 125 mcg by mouth daily before breakfast.   Yes [provider]  lisinopril (PRINIVIL,ZESTRIL) 20 MG tablet Take 20 mg by mouth daily. 07/04/17  Yes [provider]  metoprolol succinate (TOPROL-XL) 100 MG 24 hr tablet Take 50 mg by mouth daily. Take  with or immediately following a meal.    Yes [provider]  pioglitazone (ACTOS) 45 MG tablet Take 45 mg by mouth daily. 06/18/17  Yes [provider]  Rivaroxaban 15 & 20 MG TBPK Take as directed on package: Start with one 15mg  tablet by mouth twice a day with food. On Day 22, switch to one 20mg  tablet once a day with food. 08/29/17  Yes Nance Pear, MD  simvastatin (ZOCOR) 20 MG tablet Take 20 mg by mouth daily.   Yes [provider]  TOUJEO SOLOSTAR 300 UNIT/ML SOPN Inject 30 Units into the skin daily.  09/05/17  Yes [provider]  venlafaxine XR (EFFEXOR-XR) 75 MG 24 hr capsule Take 75 mg by mouth daily with breakfast.   Yes [provider]  omeprazole (PRILOSEC) 20 MG capsule Take 20 mg by mouth daily.    [provider]  ondansetron (ZOFRAN ODT) 4 MG disintegrating tablet Take 1 tablet (4 mg total) by mouth every 8 (eight) hours as needed for nausea or vomiting. Patient not taking: Reported on 09/07/2017 02/07/17   Gregor Hams, MD     VITAL SIGNS:  Blood pressure (!) 143/62, pulse 72, temperature 98.2 F (36.8 C), temperature source Oral, resp. rate 18, height 5\' 9"  (1.753 m), weight 97.5 kg (215 lb), SpO2 100 %.  PHYSICAL EXAMINATION:  Physical Exam  GENERAL:  62 y.o.-year-old patient lying in the bed with no acute distress.  EYES: Pupils equal, round, reactive to light and accommodation. No scleral icterus. Extraocular muscles intact.  HEENT: Head atraumatic, normocephalic. Oropharynx and nasopharynx clear. No oropharyngeal erythema, moist oral mucosa  NECK:  Supple, no jugular venous distention. No thyroid enlargement, no tenderness.  LUNGS: Normal breath sounds bilaterally, no wheezing, rales, rhonchi. No use of accessory muscles of respiration.  CARDIOVASCULAR: S1, S2 normal. No murmurs, rubs, or gallops.  ABDOMEN: Soft, nontender, nondistended. Bowel sounds present. No organomegaly or mass.  EXTREMITIES: No pedal  edema, cyanosis, or clubbing. + 2 pedal & radial pulses b/l.   NEUROLOGIC: Cranial nerves II through XII are intact. No focal Motor or sensory deficits appreciated b/l PSYCHIATRIC: The patient is alert and oriented x 3. Good affect.  SKIN: No obvious rash, lesion, or ulcer.   LABORATORY PANEL:   CBC Recent Labs  Lab 09/07/17 1244  WBC 8.8  HGB 7.4*  HCT 22.3*  PLT 372   ------------------------------------------------------------------------------------------------------------------  Chemistries  Recent Labs  Lab 09/07/17 1244  NA 137  K 3.9  CL 105  CO2 24  GLUCOSE 176*  BUN 15  CREATININE 0.84  CALCIUM 10.2   ------------------------------------------------------------------------------------------------------------------  Cardiac Enzymes Recent Labs  Lab 09/07/17 1244  TROPONINI <0.03   ------------------------------------------------------------------------------------------------------------------  RADIOLOGY:  Dg Chest 2 View  Result Date: 09/07/2017 CLINICAL DATA:  Chest pain and pressure beginning a week ago. Occasional dizziness. EXAM: CHEST  2 VIEW COMPARISON:  05/07/2012 FINDINGS: The patient has taken a poor inspiration. Heart size is normal. Mediastinal shadows are normal. There may be bronchial thickening but there is no infiltrate, collapse or effusion. Bony structures are unremarkable. IMPRESSION: Poor inspiration. Question bronchitis. No edema, consolidation or collapse. Electronically Signed   By: Jan Fireman.D.  On: 09/07/2017 13:34     IMPRESSION AND PLAN:   * GI bleed likely upper due to melena.  Acute blood loss anemia Patient has history of gastritis.  Also with history of diverticulosis could be lower GI bleed.  At this time stop Xarelto.  Start IV Protonix twice daily. Repeat hemoglobin later today.  Transfuse if less than 7.  Type and cross. Consult GI.  Patient will benefit from EGD.  Hopefully her GI bleed will stop with holding  Xarelto and EGD can be scheduled as outpatient. Clear liquid diet.  *Right popliteal and distal femoral DVT.  Patient has GI bleed with recently started Xarelto.  Will consult vascular surgery for IVC filter.  *Diabetes mellitus.  Continue home dose of insulin at lower dose.  Sliding scale insulin.  All the records are reviewed and case discussed with ED provider. Management plans discussed with the patient, family and they are in agreement.  CODE STATUS: FULL CODE  TOTAL TIME TAKING CARE OF THIS PATIENT: 40 minutes.   Neita Carp M.D on 09/07/2017 at 4:10 PM  Between 7am to 6pm - Pager - [phone]  After 6pm go to [email or web address] - password [email or web address] [facility]  Office  [phone]  CC: Primary care physician; Lorelee Market, MD  Note: This dictation was prepared with Dragon dictation along with smaller phrase technology. Any transcriptional errors that result from this process are unintentional.

## 2017-09-07 NOTE — ED Notes (Signed)
Given report to floor, waiting to send pt up once floor calls to say room is clean

## 2017-09-07 NOTE — ED Notes (Signed)
Blue Tube sent as well .

## 2017-09-07 NOTE — Consult Note (Signed)
[facility] Vascular Consult Note  MRN : [id number]  Ann Larson is a 63 y.o. (09/02/1955) female who presents with chief complaint of  Chief Complaint  Patient presents with  . Weakness  . Shortness of Breath  .  History of Present Illness: I am asked by Dr. Darvin Neighbours to see the patient regarding a recent DVT now with GI bleeding.  A little over a week ago, she was found popliteal and distal femoral vein DVT on ultrasound.  She was having leg pain and swelling.  She presented on this episode with weakness and shortness of breath.  She was found to have GI bleeding after being started on Xarelto last week.  Given this, we are asked to see her for consideration for IVC filter.  No current chest pain or shortness of breath.  No fever or chills.  Current Facility-Administered Medications  Medication Dose Route Frequency Provider Last Rate Last Dose  . acetaminophen (TYLENOL) tablet 650 mg  650 mg Oral Q6H PRN Hillary Bow, MD       Or  . acetaminophen (TYLENOL) suppository 650 mg  650 mg Rectal Q6H PRN Sudini, Srikar, MD      . albuterol (PROVENTIL) (2.5 MG/3ML) 0.083% nebulizer solution 2.5 mg  2.5 mg Nebulization Q2H PRN Sudini, Srikar, MD      . insulin aspart (novoLOG) injection 0-15 Units  0-15 Units Subcutaneous TID WC Hillary Bow, MD   2 Units at 09/07/17 1829  . insulin aspart (novoLOG) injection 0-5 Units  0-5 Units Subcutaneous QHS Sudini, Srikar, MD      . insulin glargine (LANTUS) injection 20 Units  20 Units Subcutaneous Daily Sudini, Alveta Heimlich, MD      . Derrill Memo ON 09/08/2017] levothyroxine (SYNTHROID, LEVOTHROID) tablet 125 mcg  125 mcg Oral QAC breakfast Hillary Bow, MD      . Derrill Memo ON 09/08/2017] metoprolol succinate (TOPROL-XL) 24 hr tablet 50 mg  50 mg Oral Daily Sudini, Srikar, MD      . ondansetron (ZOFRAN) tablet 4 mg  4 mg Oral Q6H PRN Sudini, Alveta Heimlich, MD       Or  . ondansetron (ZOFRAN) injection 4 mg  4 mg Intravenous Q6H PRN Sudini,  Srikar, MD      . pantoprazole (PROTONIX) injection 40 mg  40 mg Intravenous BID Hillary Bow, MD   40 mg at 09/07/17 1829  . polyethylene glycol (MIRALAX / GLYCOLAX) packet 17 g  17 g Oral Daily PRN Sudini, Srikar, MD      . sodium chloride flush (NS) 0.9 % injection 3 mL  3 mL Intravenous Q12H Sudini, Srikar, MD      . traMADol Veatrice Bourbon) tablet 50 mg  50 mg Oral Q6H PRN Hillary Bow, MD      . Derrill Memo ON 09/08/2017] venlafaxine XR (EFFEXOR-XR) 24 hr capsule 75 mg  75 mg Oral Q breakfast Hillary Bow, MD        Past Medical History:  Diagnosis Date  . Depression   . Diabetes mellitus without complication ([place])   . Diverticulitis   . DVT (deep vein thrombosis) in pregnancy ([place]) 08/29/2017   Right popliteal and femoral  . GERD (gastroesophageal reflux disease)   . Hyperlipidemia   . Hypertension   . Hypothyroidism   . Obesity     Past Surgical History:  Procedure Laterality Date  . ABDOMINAL HYSTERECTOMY  2005  . CHOLECYSTECTOMY  2015  . COLONOSCOPY WITH PROPOFOL N/A 07/01/2015   Procedure: COLONOSCOPY WITH PROPOFOL;  Surgeon: Josefine Class, MD;  Location: [facility] ENDOSCOPY;  Service: Endoscopy;  Laterality: N/A;  . ESOPHAGOGASTRODUODENOSCOPY (EGD) WITH PROPOFOL N/A 07/01/2015   Procedure: ESOPHAGOGASTRODUODENOSCOPY (EGD) WITH PROPOFOL;  Surgeon: Josefine Class, MD;  Location: [facility] ENDOSCOPY;  Service: Endoscopy;  Laterality: N/A;    Social History Social History   Tobacco Use  . Smoking status: Never Smoker  . Smokeless tobacco: Never Used  Substance Use Topics  . Alcohol use: No  . Drug use: No    Family History Family History  Problem Relation Age of Onset  . Deep vein thrombosis Neg Hx   No aneurysms, no porphyrias, no autoimmune diseases  Allergies  Allergen Reactions  . Sulfa Antibiotics      REVIEW OF SYSTEMS (Negative unless checked)  Constitutional: [] Weight loss  [] Fever  [] Chills Cardiac: [] Chest pain   [] Chest pressure   [] Palpitations    [] Shortness of breath when laying flat   [x] Shortness of breath at rest   [x] Shortness of breath with exertion. Vascular:  [x] Pain in legs with walking   [] Pain in legs at rest   [] Pain in legs when laying flat   [] Claudication   [] Pain in feet when walking  [] Pain in feet at rest  [] Pain in feet when laying flat   [x] History of DVT   [] Phlebitis   [x] Swelling in legs   [] Varicose veins   [] Non-healing ulcers Pulmonary:   [] Uses home oxygen   [] Productive cough   [] Hemoptysis   [] Wheeze  [] COPD   [] Asthma Neurologic:  [] Dizziness  [] Blackouts   [] Seizures   [] History of stroke   [] History of TIA  [] Aphasia   [] Temporary blindness   [] Dysphagia   [] Weakness or numbness in arms   [] Weakness or numbness in legs Musculoskeletal:  [x] Arthritis   [] Joint swelling   [] Joint pain   [] Low back pain Hematologic:  [] Easy bruising  [] Easy bleeding   [] Hypercoagulable state   [x] Anemic  [] Hepatitis Gastrointestinal:  [x] Blood in stool   [] Vomiting blood  [x] Gastroesophageal reflux/heartburn   [] Difficulty swallowing. Genitourinary:  [] Chronic kidney disease   [] Difficult urination  [] Frequent urination  [] Burning with urination   [] Blood in urine Skin:  [] Rashes   [] Ulcers   [] Wounds Psychological:  [] History of anxiety   []  History of major depression.  Physical Examination  Vitals:   09/07/17 1238 09/07/17 1530 09/07/17 1630 09/07/17 1745  BP:  (!) 143/62 116/62 127/60  Pulse:  72 69 71  Resp:  18 18 20   Temp:    98.8 F (37.1 C)  TempSrc:    Oral  SpO2:  100% 100% 99%  Weight: 97.5 kg (215 lb)     Height: 5\' 9"  (1.753 m)      Body mass index is 31.75 kg/m. Gen:  WD/WN, NAD Head: [place]/AT, No temporalis wasting Ear/Nose/Throat: Hearing grossly intact, nares w/o erythema or drainage, oropharynx w/o Erythema/Exudate Eyes: Sclera non-icteric, conjunctiva clear Neck: Trachea midline.  No JVD.  Pulmonary:  Good air movement, respirations not labored, equal bilaterally.  Cardiac: RRR, normal S1,  S2. Vascular:  Vessel Right Left  Radial Palpable Palpable                                    Musculoskeletal: M/S 5/5 throughout.  Extremities without ischemic changes.  No deformity or atrophy. Mild RLE edema Neurologic: Sensation grossly intact in extremities.  Symmetrical.  Speech is fluent. Motor exam as listed above.  Psychiatric: Judgment intact, Mood & affect appropriate for pt's clinical situation. Dermatologic: No rashes or ulcers noted.  No cellulitis or open wounds. Lymph : No Cervical, Axillary, or Inguinal lymphadenopathy.      CBC Lab Results  Component Value Date   WBC 8.8 09/07/2017   HGB 7.4 (L) 09/07/2017   HCT 22.3 (L) 09/07/2017   MCV 93.2 09/07/2017   PLT 372 09/07/2017    BMET    Component Value Date/Time   NA 137 09/07/2017 1244   NA 142 05/07/2012 1448   K 3.9 09/07/2017 1244   K 3.9 05/07/2012 1448   CL 105 09/07/2017 1244   CL 105 05/07/2012 1448   CO2 24 09/07/2017 1244   CO2 30 05/07/2012 1448   GLUCOSE 176 (H) 09/07/2017 1244   GLUCOSE 95 05/07/2012 1448   BUN 15 09/07/2017 1244   BUN 22 (H) 05/07/2012 1448   CREATININE 0.84 09/07/2017 1244   CREATININE 1.40 (H) 05/07/2012 1448   CALCIUM 10.2 09/07/2017 1244   CALCIUM 10.3 (H) 05/07/2012 1448   GFRNONAA >60 09/07/2017 1244   GFRNONAA 42 (L) 05/07/2012 1448   GFRAA >60 09/07/2017 1244   GFRAA 49 (L) 05/07/2012 1448   Estimated Creatinine Clearance: 87.4 mL/min (by C-G formula based on SCr of 0.84 mg/dL).  COAG No results found for: INR, PROTIME  Radiology Dg Chest 2 View  Result Date: 09/07/2017 CLINICAL DATA:  Chest pain and pressure beginning a week ago. Occasional dizziness. EXAM: CHEST  2 VIEW COMPARISON:  05/07/2012 FINDINGS: The patient has taken a poor inspiration. Heart size is normal. Mediastinal shadows are normal. There may be bronchial thickening but there is no infiltrate, collapse or effusion. Bony structures are unremarkable. IMPRESSION: Poor inspiration.  Question bronchitis. No edema, consolidation or collapse. Electronically Signed   By: Nelson Chimes M.D.   On: 09/07/2017 13:34   US Venous Img Lower Unilateral Right  Result Date: 08/29/2017 CLINICAL DATA:  Pain, swelling, redness x3 days EXAM: RIGHT LOWER EXTREMITY VENOUS DOPPLER ULTRASOUND TECHNIQUE: Gray-scale sonography with compression, as well as color and duplex ultrasound, were performed to evaluate the deep venous system from the level of the common femoral vein through the popliteal and proximal calf veins. COMPARISON:  None FINDINGS: Occlusive thrombus in posterior tibial and peroneal veins extending through the popliteal vein into the distal femoral vein. The mid and central aspect of the femoral vein remain patent. Unremarkable common femoral vein and visualized portion of the deep femoral vein. IMPRESSION: 1. Occlusive right calf and popliteal DVT extending into the distal femoral vein. Electronically Signed   By: Lucrezia Europe M.D.   On: 08/29/2017 13:52      Assessment/Plan 1.  Right lower extremity DVT which is only a week old with admission for GI bleeding and significant anemia.  As such, her anticoagulation will have to be stopped.  This would represent a classic indication for IVC filter, and I would recommend placement of the filter.  I discussed the risks and benefits the procedure.  I discussed this filter can be removed if she is resumed on anticoagulation or after 6 months.  I have recommended this be placed tomorrow and have placed this on the schedule.  She voices her understanding and is agreeable to proceed. 2.  Anemia from GI bleed.  We will have to stop anticoagulation. 3.  Hypertension.  Stable on outpatient medications and blood pressure control important in reducing the progression of atherosclerotic disease. On appropriate oral medications. 4.  DM.  Stable on outpatient medications and blood glucose control important in reducing the progression of atherosclerotic disease.  Also, involved in wound healing. On appropriate medications.    Leotis Pain, MD  09/07/2017 7:34 PM    This note was created with Dragon medical transcription system.  Any error is purely unintentional

## 2017-09-07 NOTE — ED Provider Notes (Signed)
[facility] Emergency Department Provider Note   ____________________________________________   First MD Initiated Contact with Patient 09/07/17 1515     (approximate)  I have reviewed the triage vital signs and the nursing notes.   HISTORY  Chief Complaint Weakness and Shortness of Breath   HPI Ann Larson is a 62 y.o. female Who reports of abdominal pain and rumbling for several months. She supposed to be seeing the gastroenterologist but is on a wait list. She had a DVT noted about 2 weeks ago and is taking xarelto. Since she started taking xarelto she's been noting black stools and recently has been getting dizzy and short of breath with lightheadedness and some chest pain on exertion. She did not have any of that before the xarelto.  Past Medical History:  Diagnosis Date  . Depression   . Diabetes mellitus without complication ([place])   . Diverticulitis   . GERD (gastroesophageal reflux disease)   . Hyperlipidemia   . Hypertension   . Hypothyroidism   . Obesity     There are no active problems to display for this patient.   Past Surgical History:  Procedure Laterality Date  . ABDOMINAL HYSTERECTOMY  2005  . CHOLECYSTECTOMY  2015  . COLONOSCOPY WITH PROPOFOL N/A 07/01/2015   Procedure: COLONOSCOPY WITH PROPOFOL;  Surgeon: Josefine Class, MD;  Location: [facility] ENDOSCOPY;  Service: Endoscopy;  Laterality: N/A;  . ESOPHAGOGASTRODUODENOSCOPY (EGD) WITH PROPOFOL N/A 07/01/2015   Procedure: ESOPHAGOGASTRODUODENOSCOPY (EGD) WITH PROPOFOL;  Surgeon: Josefine Class, MD;  Location[facility] ENDOSCOPY;  Service: Endoscopy;  Laterality: N/A;    Prior to Admission medications   Medication Sig Start Date End Date Taking? Authorizing Provider  levothyroxine (SYNTHROID, LEVOTHROID) 125 MCG tablet Take 125 mcg by mouth daily before breakfast.    [provider]  lisinopril (PRINIVIL,ZESTRIL) 20 MG tablet Take 20 mg by mouth daily.  07/04/17   [provider]  metFORMIN (GLUMETZA) 500 MG (MOD) 24 hr tablet Take 500 mg by mouth daily with supper.    [provider]  metoprolol succinate (TOPROL-XL) 100 MG 24 hr tablet Take 100 mg by mouth daily. Take with or immediately following a meal.    [provider]  omeprazole (PRILOSEC) 20 MG capsule Take 20 mg by mouth daily.    [provider]  ondansetron (ZOFRAN ODT) 4 MG disintegrating tablet Take 1 tablet (4 mg total) by mouth every 8 (eight) hours as needed for nausea or vomiting. 02/07/17   Gregor Hams, MD  pioglitazone (ACTOS) 45 MG tablet Take 45 mg by mouth daily. 06/18/17   [provider]  polyethylene glycol-electrolytes (NULYTELY/GOLYTELY) 420 G solution Take 4,000 mLs by mouth once.    [provider]  Rivaroxaban 15 & 20 MG TBPK Take as directed on package: Start with one 15mg  tablet by mouth twice a day with food. On Day 22, switch to one 20mg  tablet once a day with food. 08/29/17   Nance Pear, MD  simvastatin (ZOCOR) 20 MG tablet Take 20 mg by mouth daily.    [provider]  TOUJEO SOLOSTAR 300 UNIT/ML SOPN Inject 50 Units into the skin daily. 09/05/17   [provider]  venlafaxine XR (EFFEXOR-XR) 75 MG 24 hr capsule Take 75 mg by mouth daily with breakfast.    [provider]    Allergies Sulfa antibiotics  History reviewed. No pertinent family history.  Social History Social History   Tobacco Use  . Smoking status:  Never Smoker  . Smokeless tobacco: Never Used  Substance Use Topics  . Alcohol use: No  . Drug use: No    Review of Systems  Constitutional: No fever/chills Eyes: No visual changes. ENT: No sore throat. Cardiovascular: see history of present illness Respiratory: C history of present illness Gastrointestinal:the history of present illness. Genitourinary: Negative for dysuria. Musculoskeletal: Negative for back pain. Skin: Negative for  rash. Neurological: Negative for headaches, focal weakness  ____________________________________________   PHYSICAL EXAM:  VITAL SIGNS: ED Triage Vitals  Enc Vitals Group     BP 09/07/17 1237 (!) 129/57     Pulse Rate 09/07/17 1237 79     Resp 09/07/17 1237 18     Temp 09/07/17 1237 98.2 F (36.8 C)     Temp Source 09/07/17 1237 Oral     SpO2 09/07/17 1237 99 %     Weight 09/07/17 1238 215 lb (97.5 kg)     Height 09/07/17 1238 5\' 9"  (1.753 m)     Head Circumference --      Peak Flow --      Pain Score 09/07/17 1237 0     Pain Loc --      Pain Edu? --      Excl. in [place]? --     Constitutional: Alert and oriented. Well appearing and in no acute distress. Eyes: Conjunctivae are normal.  Head: Atraumatic. Nose: No congestion/rhinnorhea. Mouth/Throat: Mucous membranes are moist.  Oropharynx non-erythematous. Neck: No stridor Cardiovascular: Normal rate, regular rhythm. Grossly normal heart sounds.  Good peripheral circulation. Respiratory: Normal respiratory effort.  No retractions. Lungs CTAB. Gastrointestinal: Soft and nontender. No distention. No abdominal bruits. No CVA tenderness. rectal: No hemorrhoids are seen externally. No masses internally. Stool is black and Hemoccult positive.  Musculoskeletal: No lower extremity tenderness nor edema.  No joint effusions. Neurologic:  Normal speech and language. No gross focal neurologic deficits are appreciated.  Skin:  Skin is warm, dry and intact. No rash noted. Psychiatric: Mood and affect are normal. Speech and behavior are normal.  ____________________________________________   LABS (all labs ordered are listed, but only abnormal results are displayed)  Labs Reviewed  BASIC METABOLIC PANEL - Abnormal; Notable for the following components:      Result Value   Glucose, Bld 176 (*)    All other components within normal limits  CBC - Abnormal; Notable for the following components:   RBC 2.40 (*)    Hemoglobin 7.4 (*)     HCT 22.3 (*)    RDW 15.2 (*)    All other components within normal limits  TROPONIN I  TYPE AND SCREEN   ____________________________________________  EKG  EKG read and interpreted by me shows normal sinus rhythm rate of 79 normal axis nonspecific ST-T changes ____________________________________________  RADIOLOGY  Dg Chest 2 View  Result Date: 09/07/2017 CLINICAL DATA:  Chest pain and pressure beginning a week ago. Occasional dizziness. EXAM: CHEST  2 VIEW COMPARISON:  05/07/2012 FINDINGS: The patient has taken a poor inspiration. Heart size is normal. Mediastinal shadows are normal. There may be bronchial thickening but there is no infiltrate, collapse or effusion. Bony structures are unremarkable. IMPRESSION: Poor inspiration. Question bronchitis. No edema, consolidation or collapse. Electronically Signed   By: Nelson Chimes M.D.   On: 09/07/2017 13:34  chest x-ray is read as possible bronchitis.  ____________________________________________   PROCEDURES  Procedure(s) performed:   Procedures  Critical Care performed:  ____________________________________________   INITIAL IMPRESSION / ASSESSMENT AND PLAN /  ED COURSE  patient had a recent DVT is started on xarelto. She is now having gastrointestinal bleeding herH&H is dropped from 13.9 and 41 June of this past year27.4 and 22.3. She just started xarelto for the DVT. I will type and screen her plan on admitting her to the hospital and anticipate consult and gastroenterology hopefully we will not have to place a filter.      ____________________________________________   FINAL CLINICAL IMPRESSION(S) / ED DIAGNOSES  Final diagnoses:  Symptomatic anemia  Gastrointestinal hemorrhage with melena  Medication side effect, initial encounter     ED Discharge Orders    None       Note:  This document was prepared using Dragon voice recognition software and may include unintentional dictation errors.    Nena Polio, MD 09/07/17 1540

## 2017-09-08 ENCOUNTER — Encounter: Payer: Self-pay | Admitting: Anesthesiology

## 2017-09-08 ENCOUNTER — Inpatient Hospital Stay: Payer: BLUE CROSS/BLUE SHIELD | Admitting: Anesthesiology

## 2017-09-08 ENCOUNTER — Encounter
Admission: EM | Disposition: A | Discharge status: Home / Self Care | Payer: Self-pay | Source: Home / Self Care | Attending: Internal Medicine

## 2017-09-08 DIAGNOSIS — I82401 Acute embolism and thrombosis of unspecified deep veins of right lower extremity: Secondary | ICD-10-CM

## 2017-09-08 DIAGNOSIS — R195 Other fecal abnormalities: Secondary | ICD-10-CM

## 2017-09-08 DIAGNOSIS — K921 Melena: Principal | ICD-10-CM

## 2017-09-08 DIAGNOSIS — K922 Gastrointestinal hemorrhage, unspecified: Secondary | ICD-10-CM

## 2017-09-08 DIAGNOSIS — D649 Anemia, unspecified: Secondary | ICD-10-CM

## 2017-09-08 HISTORY — PX: ESOPHAGOGASTRODUODENOSCOPY (EGD) WITH PROPOFOL: SHX5813

## 2017-09-08 HISTORY — PX: IVC FILTER INSERTION: CATH118245

## 2017-09-08 LAB — GLUCOSE, CAPILLARY
GLUCOSE-CAPILLARY: 140 mg/dL — AB (ref 65–99)
GLUCOSE-CAPILLARY: 162 mg/dL — AB (ref 65–99)
Glucose-Capillary: 186 mg/dL — ABNORMAL HIGH (ref 65–99)

## 2017-09-08 LAB — CBC
HEMATOCRIT: 20.9 % — AB (ref 35.0–47.0)
HEMOGLOBIN: 6.9 g/dL — AB (ref 12.0–16.0)
MCH: 30.7 pg (ref 26.0–34.0)
MCHC: 32.8 g/dL (ref 32.0–36.0)
MCV: 93.4 fL (ref 80.0–100.0)
Platelets: 339 10*3/uL (ref 150–440)
RBC: 2.23 MIL/uL — ABNORMAL LOW (ref 3.80–5.20)
RDW: 15.3 % — ABNORMAL HIGH (ref 11.5–14.5)
WBC: 7.3 10*3/uL (ref 3.6–11.0)

## 2017-09-08 LAB — HEMOGLOBIN AND HEMATOCRIT, BLOOD
HEMATOCRIT: 24.6 % — AB (ref 35.0–47.0)
HEMOGLOBIN: 8.1 g/dL — AB (ref 12.0–16.0)

## 2017-09-08 LAB — PREPARE RBC (CROSSMATCH)

## 2017-09-08 LAB — ABO/RH: ABO/RH(D): O POS

## 2017-09-08 SURGERY — ESOPHAGOGASTRODUODENOSCOPY (EGD) WITH PROPOFOL
Anesthesia: General

## 2017-09-08 SURGERY — IVC FILTER INSERTION
Anesthesia: Moderate Sedation

## 2017-09-08 MED ORDER — SODIUM CHLORIDE 0.9 % IV SOLN
Freq: Once | INTRAVENOUS | Status: AC
  Administered 2017-09-08: 14:00:00 via INTRAVENOUS

## 2017-09-08 MED ORDER — PROPOFOL 10 MG/ML IV BOLUS
INTRAVENOUS | Status: DC | PRN
  Administered 2017-09-08: 70 mg via INTRAVENOUS

## 2017-09-08 MED ORDER — LIDOCAINE-EPINEPHRINE (PF) 1 %-1:200000 IJ SOLN
INTRAMUSCULAR | Status: DC | PRN
  Administered 2017-09-08: 7 mg

## 2017-09-08 MED ORDER — PROPOFOL 10 MG/ML IV BOLUS
INTRAVENOUS | Status: AC
  Filled 2017-09-08: qty 20

## 2017-09-08 MED ORDER — OXYCODONE HCL 5 MG/5ML PO SOLN: 5.0000 mg | Freq: Once | ORAL | Status: DC | PRN

## 2017-09-08 MED ORDER — OXYCODONE HCL 5 MG PO TABS: 5.0000 mg | ORAL_TABLET | Freq: Once | ORAL | Status: DC | PRN

## 2017-09-08 MED ORDER — MIDAZOLAM HCL 2 MG/2ML IJ SOLN
INTRAMUSCULAR | Status: DC | PRN
  Administered 2017-09-08: 2 mg via INTRAVENOUS

## 2017-09-08 MED ORDER — SODIUM CHLORIDE 0.9 % IV SOLN
INTRAVENOUS | Status: DC
  Administered 2017-09-08: 11:00:00 via INTRAVENOUS

## 2017-09-08 MED ORDER — ONDANSETRON HCL 4 MG/2ML IJ SOLN
4.0000 mg | Freq: Once | INTRAMUSCULAR | Status: AC
  Administered 2017-09-08: 4 mg via INTRAVENOUS

## 2017-09-08 MED ORDER — GUAIFENESIN 100 MG/5ML PO SOLN
5.0000 mL | Freq: Four times a day (QID) | ORAL | Status: DC | PRN
  Filled 2017-09-08: qty 5

## 2017-09-08 MED ORDER — ONDANSETRON HCL 4 MG/2ML IJ SOLN
INTRAMUSCULAR | Status: AC
  Filled 2017-09-08: qty 2

## 2017-09-08 MED ORDER — MIDAZOLAM HCL 2 MG/2ML IJ SOLN
INTRAMUSCULAR | Status: AC
  Filled 2017-09-08: qty 2

## 2017-09-08 MED ORDER — LIDOCAINE HCL (CARDIAC) 20 MG/ML IV SOLN
INTRAVENOUS | Status: DC | PRN
  Administered 2017-09-08: 50 mg via INTRAVENOUS

## 2017-09-08 MED ORDER — PROPOFOL 500 MG/50ML IV EMUL
INTRAVENOUS | Status: DC | PRN
  Administered 2017-09-08: 150 ug/kg/min via INTRAVENOUS

## 2017-09-08 MED ORDER — LIDOCAINE-EPINEPHRINE (PF) 1 %-1:200000 IJ SOLN
INTRAMUSCULAR | Status: AC
  Filled 2017-09-08: qty 30

## 2017-09-08 MED ORDER — FENTANYL CITRATE (PF) 100 MCG/2ML IJ SOLN: 25.0000 ug | INTRAMUSCULAR | Status: DC | PRN

## 2017-09-08 SURGICAL SUPPLY — 3 items
FILTER VC CELECT-FEMORAL (Filter) ×3 IMPLANT
PACK ANGIOGRAPHY (CUSTOM PROCEDURE TRAY) ×3 IMPLANT
WIRE J 3MM .035X145CM (WIRE) ×3 IMPLANT

## 2017-09-08 NOTE — Progress Notes (Signed)
Pt back from procedures. Pt to remain lying flat until 1415. Educated pt and family on reasons behind this. Pt and family seemed to understand.

## 2017-09-08 NOTE — Progress Notes (Signed)
[place] at [place] NAME: Ann Larson    MR#:  [id number]  DATE OF BIRTH:  Mar 28, 1956  SUBJECTIVE:  CHIEF COMPLAINT:   Chief Complaint  Patient presents with  . Weakness  . Shortness of Breath   Used to have weakness.  Chronic epigastric abdominal pain which is mild is present  REVIEW OF SYSTEMS:    Review of Systems  Constitutional: Positive for malaise/fatigue. Negative for chills and fever.  HENT: Negative for sore throat.   Eyes: Negative for blurred vision, double vision and pain.  Respiratory: Negative for cough, hemoptysis, shortness of breath and wheezing.   Cardiovascular: Negative for chest pain, palpitations, orthopnea and leg swelling.  Gastrointestinal: Positive for abdominal pain and melena. Negative for constipation, diarrhea, heartburn, nausea and vomiting.  Genitourinary: Negative for dysuria and hematuria.  Musculoskeletal: Negative for back pain and joint pain.  Skin: Negative for rash.  Neurological: Positive for weakness. Negative for sensory change, speech change, focal weakness and headaches.  Endo/Heme/Allergies: Does not bruise/bleed easily.  Psychiatric/Behavioral: Negative for depression. The patient is not nervous/anxious.     DRUG ALLERGIES:   Allergies  Allergen Reactions  . Sulfa Antibiotics     VITALS:  Blood pressure (!) 125/47, pulse 78, temperature 98.5 F (36.9 C), temperature source Oral, resp. rate 18, height 5\' 9"  (1.753 m), weight 97.5 kg (215 lb), SpO2 100 %.  PHYSICAL EXAMINATION:   Physical Exam  GENERAL:  62 y.o.-year-old patient lying in the bed with no acute distress.  Obese EYES: Pupils equal, round, reactive to light and accommodation. No scleral icterus. Extraocular muscles intact.  HEENT: Head atraumatic, normocephalic. Oropharynx and nasopharynx clear.  NECK:  Supple, no jugular venous distention. No thyroid enlargement, no tenderness.  LUNGS: Normal breath sounds  bilaterally, no wheezing, rales, rhonchi. No use of accessory muscles of respiration.  CARDIOVASCULAR: S1, S2 normal. No murmurs, rubs, or gallops.  ABDOMEN: Soft, epigastric tenderness, nondistended. Bowel sounds present. No organomegaly or mass.  EXTREMITIES: No cyanosis, clubbing or edema b/l.    NEUROLOGIC: Cranial nerves II through XII are intact. No focal Motor or sensory deficits b/l.   PSYCHIATRIC: The patient is alert and oriented x 3.  SKIN: No obvious rash, lesion, or ulcer.   LABORATORY PANEL:   CBC Recent Labs  Lab 09/08/17 0448  WBC 7.3  HGB 6.9*  HCT 20.9*  PLT 339   ------------------------------------------------------------------------------------------------------------------ Chemistries  Recent Labs  Lab 09/07/17 1244  NA 137  K 3.9  CL 105  CO2 24  GLUCOSE 176*  BUN 15  CREATININE 0.84  CALCIUM 10.2   ------------------------------------------------------------------------------------------------------------------  Cardiac Enzymes Recent Labs  Lab 09/07/17 1244  TROPONINI <0.03   ------------------------------------------------------------------------------------------------------------------  RADIOLOGY:  Dg Chest 2 View  Result Date: 09/07/2017 CLINICAL DATA:  Chest pain and pressure beginning a week ago. Occasional dizziness. EXAM: CHEST  2 VIEW COMPARISON:  05/07/2012 FINDINGS: The patient has taken a poor inspiration. Heart size is normal. Mediastinal shadows are normal. There may be bronchial thickening but there is no infiltrate, collapse or effusion. Bony structures are unremarkable. IMPRESSION: Poor inspiration. Question bronchitis. No edema, consolidation or collapse. Electronically Signed   By: Nelson Chimes M.D.   On: 09/07/2017 13:34     ASSESSMENT AND PLAN:   * GI bleed likely upper due to melena.  Acute blood loss anemia Patient has history of gastritis.  Also with history of diverticulosis could be lower GI bleed.  Xarelto  stopped. Worsening anemia  due to ongoing bleeding.  Will need stat transfusion of 1 unit packed RBC.  Consent obtained.  Orders entered. Repeat hemoglobin after transfusion. Discussed with Dr. Verl Blalock of GI.  Will need endoscopy.  *Right popliteal and distal femoral DVT.  Patient has GI bleed with recently started Xarelto. Consulted vascular surgery.  Appreciate Dr. Durward Parcel input.  IVC filter to be placed later today.  *Diabetes mellitus.  Continue home dose of insulin at lower dose.  Sliding scale insulin.  All the records are reviewed and case discussed with Care Management/Social Worker Management plans discussed with the patient, family and they are in agreement.  CODE STATUS: FULL CODE  DVT Prophylaxis: SCDs  TOTAL CC TIME TAKING CARE OF THIS PATIENT: 35 minutes.   POSSIBLE D/C IN 1-2 DAYS, DEPENDING ON CLINICAL CONDITION.  Neita Carp M.D on 09/08/2017 at 9:05 AM  Between 7am to 6pm - Pager - [phone]  After 6pm go to [email or web address] - password [email or web address] [facility]  Office  [phone]  CC: Primary care physician; Lorelee Market, MD  Note: This dictation was prepared with Dragon dictation along with smaller phrase technology. Any transcriptional errors that result from this process are unintentional.

## 2017-09-08 NOTE — Transfer of Care (Signed)
Immediate Anesthesia Transfer of Care Note  Patient: Ann Larson  Procedure(s) Performed: ESOPHAGOGASTRODUODENOSCOPY (EGD) WITH PROPOFOL (N/A )  Patient Location: PACU  Anesthesia Type:General  Level of Consciousness: awake and alert   Airway & Oxygen Therapy: Patient Spontanous Breathing and Patient connected to nasal cannula oxygen  Post-op Assessment: Report given to RN and Post -op Vital signs reviewed and stable  Post vital signs: Reviewed and stable  Last Vitals:  Vitals:   09/08/17 1218 09/08/17 1223  BP: (!) 114/55 (!) 114/55  Pulse: 89 89  Resp: (!) 7 18  Temp:    SpO2: 97% 100%    Last Pain:  Vitals:   09/08/17 1218  TempSrc:   PainSc: Asleep         Complications: No apparent anesthesia complications

## 2017-09-08 NOTE — Progress Notes (Signed)
Blood transfusion started at 1400. No signs of reaction at this time.

## 2017-09-08 NOTE — Op Note (Signed)
[facility]   OPERATIVE NOTE    PRE-OPERATIVE DIAGNOSIS: Recent right lower extremity DVT with admission for anemia and upper GI bleeding requiring cessation of anticoagulation  POST-OPERATIVE DIAGNOSIS: same as above  PROCEDURE: 1.   Ultrasound guidance for vascular access to the right femoral vein 2.   Catheter placement into the inferior vena cava 3.   Inferior venacavogram 4.   Placement of a Cook Celect IVC filter  SURGEON: Leotis Pain, MD  ASSISTANT(S): None  ANESTHESIA: local   ESTIMATED BLOOD LOSS: minimal  CONTRAST: 10 cc  FLUORO TIME: less than one minute  FINDING(S): 1.  Patent IVC  SPECIMEN(S):  none  INDICATIONS:   Ann Larson is a 62 y.o. female who presents with a DVT less than 38 weeks old and a new admission with anemia and upper GI bleeding requiring cessation of anticoagulation.  Inferior vena cava filter is indicated for this reason.  Risks and benefits including filter thrombosis, migration, fracture, bleeding, and infection were all discussed.  We discussed that all IVC filters that we place can be removed if desired from the patient once the need for the filter has passed.    DESCRIPTION: After obtaining full informed written consent, the patient was brought back to the vascular suite. The skin was sterilely prepped and draped in a sterile surgical field was created. Moderate conscious sedation was administered during a face to face encounter with the patient throughout the procedure with my supervision of the RN administering medicines and monitoring the patient's vital signs, pulse oximetry, telemetry and mental status throughout from the start of the procedure until the patient was taken to the recovery room. The right femoral vein was accessed under direct ultrasound guidance without difficulty with a Seldinger needle and a J-wire was then placed. After skin nick and dilatation, the delivery sheath was placed into the  inferior vena cava and an inferior venacavogram was performed. This demonstrated a patent IVC with the level of the renal veins at L1.  The filter was then deployed into the inferior vena cava at the level of the top of L2 just below the renal veins. The delivery sheath was then removed. Pressure was held. Sterile dressings were placed. The patient tolerated the procedure well and was taken to the recovery room in stable condition.  COMPLICATIONS: None  CONDITION: Stable  Leotis Pain  09/08/2017, 1:21 PM   This note was created with Dragon Medical transcription system. Any errors in dictation are purely unintentional.

## 2017-09-08 NOTE — Anesthesia Post-op Follow-up Note (Signed)
Anesthesia QCDR form completed.        

## 2017-09-08 NOTE — Anesthesia Procedure Notes (Signed)
Performed by: Lance Muss, CRNA Pre-anesthesia Checklist: Patient identified, Emergency Drugs available, Suction available, Timeout performed and Patient being monitored Patient Re-evaluated:Patient Re-evaluated prior to induction Oxygen Delivery Method: Nasal cannula Induction Type: IV induction

## 2017-09-08 NOTE — Anesthesia Postprocedure Evaluation (Signed)
Anesthesia Post Note  Patient: Ann Larson  Procedure(s) Performed: ESOPHAGOGASTRODUODENOSCOPY (EGD) WITH PROPOFOL (N/A )  Patient location during evaluation: PACU Anesthesia Type: General Level of consciousness: awake and alert Pain management: pain level controlled Vital Signs Assessment: post-procedure vital signs reviewed and stable Respiratory status: spontaneous breathing, nonlabored ventilation, respiratory function stable and patient connected to nasal cannula oxygen Cardiovascular status: blood pressure returned to baseline and stable Postop Assessment: no apparent nausea or vomiting Anesthetic complications: no     Last Vitals:  Vitals:   09/08/17 1339 09/08/17 1415  BP: (!) 146/66 (!) 145/64  Pulse: 88 87  Resp: 20 20  Temp: 36.9 C 37.2 C  SpO2: 95% 94%    Last Pain:  Vitals:   09/08/17 1415  TempSrc: Oral  PainSc:                  Precious Haws Piscitello

## 2017-09-08 NOTE — Anesthesia Preprocedure Evaluation (Signed)
Anesthesia Evaluation  Patient identified by MRN, date of birth, ID band Patient awake    Reviewed: Allergy & Precautions, H&P , NPO status , Patient's Chart, lab work & pertinent test results  History of Anesthesia Complications Negative for: history of anesthetic complications  Airway Mallampati: III  TM Distance: >3 FB Neck ROM: limited    Dental  (+) Chipped, Poor Dentition   Pulmonary neg pulmonary ROS, neg shortness of breath,           Cardiovascular hypertension, (-) Past MI      Neuro/Psych PSYCHIATRIC DISORDERS Depression negative neurological ROS     GI/Hepatic Neg liver ROS, GERD  ,  Endo/Other  diabetes, Type 2Hypothyroidism   Renal/GU negative Renal ROS  negative genitourinary   Musculoskeletal   Abdominal   Peds  Hematology negative hematology ROS (+)   Anesthesia Other Findings Past Medical History: No date: Depression No date: Diabetes mellitus without complication (HCC) No date: Diverticulitis 08/29/2017: DVT (deep vein thrombosis) in pregnancy ([place])     Comment:  Right popliteal and femoral No date: GERD (gastroesophageal reflux disease) No date: Hyperlipidemia No date: Hypertension No date: Hypothyroidism No date: Obesity  Past Surgical History: 2005: ABDOMINAL HYSTERECTOMY 2015: CHOLECYSTECTOMY 07/01/2015: COLONOSCOPY WITH PROPOFOL; N/A     Comment:  Procedure: COLONOSCOPY WITH PROPOFOL;  Surgeon: Josefine Class, MD;  Location: [facility] ENDOSCOPY;  Service:               Endoscopy;  Laterality: N/A; 07/01/2015: ESOPHAGOGASTRODUODENOSCOPY (EGD) WITH PROPOFOL; N/A     Comment:  Procedure: ESOPHAGOGASTRODUODENOSCOPY (EGD) WITH               PROPOFOL;  Surgeon: Josefine Class, MD;  Location:               [facility] ENDOSCOPY;  Service: Endoscopy;  Laterality: N/A;  BMI    Body Mass Index:  31.75 kg/m      Reproductive/Obstetrics negative OB ROS                              Anesthesia Physical Anesthesia Plan  ASA: III  Anesthesia Plan: General   Post-op Pain Management:    Induction: Intravenous  PONV Risk Score and Plan: Propofol infusion  Airway Management Planned: Natural Airway and Nasal Cannula  Additional Equipment:   Intra-op Plan:   Post-operative Plan:   Informed Consent: I have reviewed the patients History and Physical, chart, labs and discussed the procedure including the risks, benefits and alternatives for the proposed anesthesia with the patient or authorized representative who has indicated his/her understanding and acceptance.   Dental Advisory Given  Plan Discussed with: Anesthesiologist, CRNA and Surgeon  Anesthesia Plan Comments: (Patient consented for risks of anesthesia including but not limited to:  - adverse reactions to medications - risk of intubation if required - damage to teeth, lips or other oral mucosa - sore throat or hoarseness - Damage to heart, brain, lungs or loss of life  Patient voiced understanding.)        Anesthesia Quick Evaluation

## 2017-09-08 NOTE — Op Note (Addendum)
[facility]enterology Patient Name: Ann Larson Procedure Date: 09/08/2017 11:31 AM MRN: [id number] Account #: [id number] Date of Birth: [birth year] Admit Type: Inpatient Age: 62 Room: [facility] ENDO ROOM 4 Gender: Female Note Status: Supervisor Override Procedure:            Upper GI endoscopy Indications:          Heme positive stool, Melena Providers:            Lucilla Lame MD, MD Referring MD:         Lorelee Market (Referring MD) Medicines:            Propofol per Anesthesia Complications:        No immediate complications. Procedure:            Pre-Anesthesia Assessment:                       - Prior to the procedure, a History and Physical was                        performed, and patient medications and allergies were                        reviewed. The patient's tolerance of previous                        anesthesia was also reviewed. The risks and benefits of                        the procedure and the sedation options and risks were                        discussed with the patient. All questions were                        answered, and informed consent was obtained. Prior                        Anticoagulants: The patient has taken Xarelto                        (rivaroxaban), last dose was 2 days prior to procedure.                        ASA Grade Assessment: II - A patient with mild systemic                        disease. After reviewing the risks and benefits, the                        patient was deemed in satisfactory condition to undergo                        the procedure.                       After obtaining informed consent, the endoscope was                        passed under direct vision. Throughout the procedure,  the patient's blood pressure, pulse, and oxygen                        saturations were monitored continuously. The Endoscope                        was introduced through the mouth, and  advanced to the                        second part of duodenum. The upper GI endoscopy was                        accomplished without difficulty. The patient tolerated                        the procedure well. Findings:      The examined esophagus was normal.      Diffuse granular mucosa was found in the gastric fundus and in the       gastric body. Biopsies were taken with a cold forceps for histology.      Diffuse hemorrhagic mucosa with bleeding and stigmata of recent bleeding       was found in the gastric fundus and in the gastric body. Coagulation for       hemostasis using argon beam at 2 liters/minute and 30 watts was       successful.      The examined duodenum was normal. Impression:           - Normal esophagus.                       - Granular gastric mucosa. Biopsied.                       - Hemorrhagic gastropathy.                       - Normal examined duodenum.                       - There is no blood seen in the small bowel or in the                        antrum of the stomach but there were clots adherent to                        the wall with oozing of blood in the body and fundus of                        the stomach. Recommendation:       - Avoid anticoagulation.                       - Return patient to hospital ward for ongoing care.                       - Await pathology results. Procedure Code(s):    --- Professional ---                       22979, 59, Esophagogastroduodenoscopy, flexible,  transoral; with control of bleeding, any method                       43239, Esophagogastroduodenoscopy, flexible, transoral;                        with biopsy, single or multiple Diagnosis Code(s):    --- Professional ---                       R19.5, Other fecal abnormalities                       K92.1, Melena (includes Hematochezia)                       K92.2, Gastrointestinal hemorrhage, unspecified CPT copyright 2018 American Medical  Association. All rights reserved. The codes documented in this report are preliminary and upon coder review may  be revised to meet current compliance requirements. Lucilla Lame MD, MD 09/08/2017 11:58:42 AM This report has been signed electronically. Number of Addenda: 0 Note Initiated On: 09/08/2017 11:31 AM      [facility]

## 2017-09-08 NOTE — Progress Notes (Signed)
Pt down for EGD and IVC filter placement.

## 2017-09-08 NOTE — Progress Notes (Signed)
Consent for blood, IVC fliter, and EGD signed by pt. IV fluids hung.

## 2017-09-08 NOTE — Consult Note (Addendum)
Ann Lame, MD [facility]  [address], [place], [place] [address] Phone: [phone] Fax : [phone]  Consultation  Referring Provider:     Dr. Darvin Neighbours Primary Care Physician:  Lorelee Market, MD Primary Gastroenterologist:  Althia Forts        Reason for Consultation:     Melena  Date of Admission:  09/07/2017 Date of Consultation:  09/08/2017         HPI:   Ann Larson is a 62 y.o. female who was put on Xarelto after having a recent diagnosis of right popliteal and distal femoral DVT. The patient now came to the ER with fatigue and shortness of breath and was found to have a hemoglobin of 7.4 when her baseline is 13. His morning hemoglobin was 6.9. The patient denies having any black stools the present time. She also reports her abdominal pain is more in the right lower quadrant. The patient reports that she has had abdominal pain for some time and try to get a GI appointment [facility] and could not be seen until February. The patient did have an EGD and colonoscopy in 2016 with diverticulosis and gastritis at that time. The patient denies any anti-inflammatory medication intake.  I'm now being asked to see the patient for her melena and anemia.  Past Medical History:  Diagnosis Date  . Depression   . Diabetes mellitus without complication ([place])   . Diverticulitis   . DVT (deep vein thrombosis) in pregnancy ([place]) 08/29/2017   Right popliteal and femoral  . GERD (gastroesophageal reflux disease)   . Hyperlipidemia   . Hypertension   . Hypothyroidism   . Obesity     Past Surgical History:  Procedure Laterality Date  . ABDOMINAL HYSTERECTOMY  2005  . CHOLECYSTECTOMY  2015  . COLONOSCOPY WITH PROPOFOL N/A 07/01/2015   Procedure: COLONOSCOPY WITH PROPOFOL;  Surgeon: Josefine Class, MD;  Location: [facility] ENDOSCOPY;  Service: Endoscopy;  Laterality: N/A;  . ESOPHAGOGASTRODUODENOSCOPY (EGD) WITH PROPOFOL N/A 07/01/2015   Procedure: ESOPHAGOGASTRODUODENOSCOPY (EGD)  WITH PROPOFOL;  Surgeon: Josefine Class, MD;  Location: [facility] ENDOSCOPY;  Service: Endoscopy;  Laterality: N/A;    Prior to Admission medications   Medication Sig Start Date End Date Taking? Authorizing Provider  levothyroxine (SYNTHROID, LEVOTHROID) 125 MCG tablet Take 125 mcg by mouth daily before breakfast.   Yes [provider]  lisinopril (PRINIVIL,ZESTRIL) 20 MG tablet Take 20 mg by mouth daily. 07/04/17  Yes [provider]  metoprolol succinate (TOPROL-XL) 100 MG 24 hr tablet Take 50 mg by mouth daily. Take with or immediately following a meal.    Yes [provider]  pioglitazone (ACTOS) 45 MG tablet Take 45 mg by mouth daily. 06/18/17  Yes [provider]  Rivaroxaban 15 & 20 MG TBPK Take as directed on package: Start with one 15mg  tablet by mouth twice a day with food. On Day 22, switch to one 20mg  tablet once a day with food. 08/29/17  Yes Nance Pear, MD  simvastatin (ZOCOR) 20 MG tablet Take 20 mg by mouth daily.   Yes [provider]  TOUJEO SOLOSTAR 300 UNIT/ML SOPN Inject 30 Units into the skin daily.  09/05/17  Yes [provider]  venlafaxine XR (EFFEXOR-XR) 75 MG 24 hr capsule Take 75 mg by mouth daily with breakfast.   Yes [provider]  omeprazole (PRILOSEC) 20 MG capsule Take 20 mg by mouth daily.    [provider]  ondansetron (ZOFRAN ODT) 4 MG  disintegrating tablet Take 1 tablet (4 mg total) by mouth every 8 (eight) hours as needed for nausea or vomiting. Patient not taking: Reported on 09/07/2017 02/07/17   Gregor Hams, MD    Family History  Problem Relation Age of Onset  . Deep vein thrombosis Neg Hx      Social History   Tobacco Use  . Smoking status: Never Smoker  . Smokeless tobacco: Never Used  Substance Use Topics  . Alcohol use: No  . Drug use: No    Allergies as of 09/07/2017 - Review Complete 09/07/2017  Allergen Reaction Noted  . Sulfa antibiotics  06/30/2015     Review of Systems:    All systems reviewed and negative except where noted in HPI.   Physical Exam:  Vital signs in last 24 hours: Temp:  [98.2 F (36.8 C)-98.8 F (37.1 C)] 98.5 F (36.9 C) (01/19 0825) Pulse Rate:  [69-81] 78 (01/19 0825) Resp:  [16-20] 18 (01/19 0825) BP: (116-143)/(47-62) 125/47 (01/19 0825) SpO2:  [95 %-100 %] 100 % (01/19 0825) Weight:  [215 lb (97.5 kg)] 215 lb (97.5 kg) (01/18 1238) Last BM Date: 09/06/17 General:   Pleasant, cooperative in NAD Head:  Normocephalic and atraumatic. Eyes:   No icterus.   Conjunctiva pink. PERRLA. Ears:  Normal auditory acuity. Neck:  Supple; no masses or thyroidomegaly Lungs: Respirations even and unlabored. Lungs clear to auscultation bilaterally.   No wheezes, crackles, or rhonchi.  Heart:  Regular rate and rhythm;  Without murmur, clicks, rubs or gallops Abdomen:  Soft, nondistended, nontender. Normal bowel sounds. No appreciable masses or hepatomegaly.  No rebound or guarding.  Rectal:  Not performed. Msk:  Symmetrical without gross deformities.   Extremities:  Without edema, cyanosis or clubbing. Neurologic:  Alert and oriented x3;  grossly normal neurologically. Skin:  Intact without significant lesions or rashes. Cervical Nodes:  No significant cervical adenopathy. Psych:  Alert and cooperative. Normal affect.  LAB RESULTS: Recent Labs    09/07/17 1244 09/08/17 0448  WBC 8.8 7.3  HGB 7.4* 6.9*  HCT 22.3* 20.9*  PLT 372 339   BMET Recent Labs    09/07/17 1244  NA 137  K 3.9  CL 105  CO2 24  GLUCOSE 176*  BUN 15  CREATININE 0.84  CALCIUM 10.2   LFT No results for input(s): PROT, ALBUMIN, AST, ALT, ALKPHOS, BILITOT, BILIDIR, IBILI in the last 72 hours. PT/INR No results for input(s): LABPROT, INR in the last 72 hours.  STUDIES: Dg Chest 2 View  Result Date: 09/07/2017 CLINICAL DATA:  Chest pain and pressure beginning a week ago. Occasional dizziness. EXAM: CHEST  2 VIEW COMPARISON:   05/07/2012 FINDINGS: The patient has taken a poor inspiration. Heart size is normal. Mediastinal shadows are normal. There may be bronchial thickening but there is no infiltrate, collapse or effusion. Bony structures are unremarkable. IMPRESSION: Poor inspiration. Question bronchitis. No edema, consolidation or collapse. Electronically Signed   By: Nelson Chimes M.D.   On: 09/07/2017 13:34      Impression / Plan:   Ann Larson is a 62 y.o. y/o female with who was admitted with weakness and shortness of breath who was found to have significant anemia after having melena for the last few weeks. The patient states that the melena started after she started taking Xarelto for DVT. The patient has not taken Xarelto for 2 days and reports that she is going to have a filter placed so that she does not need  to be on the blood thinner. The patient will be brought down for an upper endoscopy to look for the source of her melena and anemia. I have discussed risks & benefits which include, but are not limited to, bleeding, infection, perforation & drug reaction.  The patient agrees with this plan & written consent will be obtained.      Thank you for involving me in the care of this patient.      LOS: 1 day   Ann Lame, MD  09/08/2017, 10:51 AM   Note: This dictation was prepared with Dragon dictation along with smaller phrase technology. Any transcriptional errors that result from this process are unintentional.

## 2017-09-09 ENCOUNTER — Encounter: Payer: Self-pay | Admitting: Gastroenterology

## 2017-09-09 LAB — TYPE AND SCREEN
ABO/RH(D): O POS
ANTIBODY SCREEN: NEGATIVE
Unit division: 0

## 2017-09-09 LAB — BPAM RBC
BLOOD PRODUCT EXPIRATION DATE: 201902072359
ISSUE DATE / TIME: 201901191346
Unit Type and Rh: 5100

## 2017-09-09 LAB — HEMOGLOBIN: HEMOGLOBIN: 8.3 g/dL — AB (ref 12.0–16.0)

## 2017-09-09 LAB — GLUCOSE, CAPILLARY
GLUCOSE-CAPILLARY: 173 mg/dL — AB (ref 65–99)
Glucose-Capillary: 234 mg/dL — ABNORMAL HIGH (ref 65–99)

## 2017-09-09 MED ORDER — OMEPRAZOLE 40 MG PO CPDR: 40.0000 mg | DELAYED_RELEASE_CAPSULE | Freq: Two times a day (BID) | ORAL | 0 refills | Status: AC

## 2017-09-09 MED ORDER — FERROUS SULFATE 325 (65 FE) MG PO TBEC: 325.0000 mg | DELAYED_RELEASE_TABLET | Freq: Two times a day (BID) | ORAL | 1 refills | Status: DC

## 2017-09-09 NOTE — Discharge Instructions (Signed)
Do not take Xarelto 

## 2017-09-09 NOTE — Plan of Care (Signed)
VS WDL, free of falls during shift.  Denies pain, no needs overnight.  Per pt request, scheduled AM Effexor dose given w/ PM meds.  Requested future doses be rescheduled.  Bed in low position, call bell within reach.  WCTM.

## 2017-09-09 NOTE — Progress Notes (Signed)
Pt D/C to home with husband. D/C paperwork given and explained. Educated on new medications. Answered all questions. IV removed intact. VSS.

## 2017-09-10 ENCOUNTER — Encounter: Payer: Self-pay | Admitting: Vascular Surgery

## 2017-09-10 LAB — HIV ANTIBODY (ROUTINE TESTING W REFLEX): HIV Screen 4th Generation wRfx: NONREACTIVE

## 2017-09-11 LAB — SURGICAL PATHOLOGY

## 2017-09-12 ENCOUNTER — Telehealth (INDEPENDENT_AMBULATORY_CARE_PROVIDER_SITE_OTHER): Payer: Self-pay

## 2017-09-12 NOTE — Telephone Encounter (Signed)
Patient called wanting to know what to do regarding her IVC filter insertion from 09/08/17. She wanted to know when to remove the bandage and shower. Per Hezzie Bump PA the bandage can be removed in 24-48 hours and she can shower in 24 hours. This information was given to the patient.

## 2017-09-14 ENCOUNTER — Telehealth: Payer: Self-pay | Admitting: Gastroenterology

## 2017-09-14 ENCOUNTER — Telehealth: Payer: Self-pay

## 2017-09-14 NOTE — Telephone Encounter (Signed)
Pt notified of pathology results

## 2017-09-14 NOTE — Telephone Encounter (Signed)
-----   Message from Lucilla Lame, MD sent at 09/13/2017  4:45 PM EST ----- The patient know that her biopsies did not show any sign of cancer or precancerous lesions. She should have a repeat upper endoscopy in 6 weeks to see if the area has healed.

## 2017-09-14 NOTE — Telephone Encounter (Signed)
Please advise from message below. I spoke with pt and she was not having any abdominal pain. Just the dark stools. EGD was done on 09/08/17. See report and let me know.

## 2017-09-14 NOTE — Telephone Encounter (Signed)
Dr. Allen Norris saw this patient in the hospital and she is still having black tarry stools. Is this normal?

## 2017-09-17 ENCOUNTER — Other Ambulatory Visit: Payer: Self-pay

## 2017-09-17 ENCOUNTER — Encounter: Payer: Self-pay | Admitting: Emergency Medicine

## 2017-09-17 ENCOUNTER — Telehealth: Payer: Self-pay | Admitting: Gastroenterology

## 2017-09-17 ENCOUNTER — Emergency Department: Payer: BLUE CROSS/BLUE SHIELD

## 2017-09-17 ENCOUNTER — Emergency Department
Admission: EM | Admit: 2017-09-17 | Discharge: 2017-09-17 | Disposition: A | Discharge status: Home / Self Care | Payer: BLUE CROSS/BLUE SHIELD | Attending: Emergency Medicine | Admitting: Emergency Medicine

## 2017-09-17 DIAGNOSIS — E119 Type 2 diabetes mellitus without complications: Secondary | ICD-10-CM | POA: Insufficient documentation

## 2017-09-17 DIAGNOSIS — I10 Essential (primary) hypertension: Secondary | ICD-10-CM | POA: Insufficient documentation

## 2017-09-17 DIAGNOSIS — Z86718 Personal history of other venous thrombosis and embolism: Secondary | ICD-10-CM | POA: Insufficient documentation

## 2017-09-17 DIAGNOSIS — E039 Hypothyroidism, unspecified: Secondary | ICD-10-CM | POA: Insufficient documentation

## 2017-09-17 DIAGNOSIS — I82431 Acute embolism and thrombosis of right popliteal vein: Secondary | ICD-10-CM | POA: Insufficient documentation

## 2017-09-17 DIAGNOSIS — Z794 Long term (current) use of insulin: Secondary | ICD-10-CM | POA: Diagnosis not present

## 2017-09-17 DIAGNOSIS — I824Y1 Acute embolism and thrombosis of unspecified deep veins of right proximal lower extremity: Secondary | ICD-10-CM

## 2017-09-17 DIAGNOSIS — Z79899 Other long term (current) drug therapy: Secondary | ICD-10-CM | POA: Insufficient documentation

## 2017-09-17 DIAGNOSIS — I82411 Acute embolism and thrombosis of right femoral vein: Secondary | ICD-10-CM | POA: Diagnosis not present

## 2017-09-17 DIAGNOSIS — R2241 Localized swelling, mass and lump, right lower limb: Secondary | ICD-10-CM | POA: Diagnosis present

## 2017-09-17 LAB — CBC
HCT: 30.5 % — ABNORMAL LOW (ref 35.0–47.0)
Hemoglobin: 9.8 g/dL — ABNORMAL LOW (ref 12.0–16.0)
MCH: 30.6 pg (ref 26.0–34.0)
MCHC: 32.1 g/dL (ref 32.0–36.0)
MCV: 95.2 fL (ref 80.0–100.0)
PLATELETS: 213 10*3/uL (ref 150–440)
RBC: 3.2 MIL/uL — ABNORMAL LOW (ref 3.80–5.20)
RDW: 19.2 % — AB (ref 11.5–14.5)
WBC: 7.3 10*3/uL (ref 3.6–11.0)

## 2017-09-17 LAB — TYPE AND SCREEN
ABO/RH(D): O POS
Antibody Screen: NEGATIVE

## 2017-09-17 LAB — COMPREHENSIVE METABOLIC PANEL
ALK PHOS: 101 U/L (ref 38–126)
ALT: 17 U/L (ref 14–54)
AST: 24 U/L (ref 15–41)
Albumin: 3.8 g/dL (ref 3.5–5.0)
Anion gap: 11 (ref 5–15)
BILIRUBIN TOTAL: 0.3 mg/dL (ref 0.3–1.2)
BUN: 13 mg/dL (ref 6–20)
CALCIUM: 10.6 mg/dL — AB (ref 8.9–10.3)
CO2: 22 mmol/L (ref 22–32)
CREATININE: 0.91 mg/dL (ref 0.44–1.00)
Chloride: 104 mmol/L (ref 101–111)
GFR calc Af Amer: >60 mL/min (ref >60)
Glucose, Bld: 170 mg/dL — ABNORMAL HIGH (ref 65–99)
Potassium: 3.8 mmol/L (ref 3.5–5.1)
Sodium: 137 mmol/L (ref 135–145)
Total Protein: 8 g/dL (ref 6.5–8.1)

## 2017-09-17 NOTE — ED Triage Notes (Signed)
Here for RLE swelling and pain.  Has known blood clot to RLE and cannot take blood thinner r/t recent GI bleed. IVC filter placed because cannot take blood thinners but pain and swelling increased today.  Pt also reports still having dark stools; dr Roselyn Reef did procedure a little over week ago for the GI bleed from blood thinners.

## 2017-09-17 NOTE — Telephone Encounter (Signed)
Spoke with pt and she is currently at the ER due to a blood clot. She is going to request a GI consult to discuss her current issues.

## 2017-09-17 NOTE — Telephone Encounter (Signed)
Contacted pt and she stated she was taking iron at her release. Pt is currently at the ER for other issues. Will have them do a CBC while there to check her hemoglobin.

## 2017-09-17 NOTE — ED Notes (Signed)
ED Provider at bedside. 

## 2017-09-17 NOTE — Telephone Encounter (Signed)
Please find out if the patient is taking Pepto-Bismol or if she is on iron. If she is not taking either one of these please have her sent for a CBC to see if her blood count is low.

## 2017-09-17 NOTE — Discharge Instructions (Signed)
Please seek medical attention for any high fevers, chest pain, shortness of breath, change in behavior, persistent vomiting, bloody stool or any other new or concerning symptoms.  

## 2017-09-17 NOTE — ED Triage Notes (Signed)
First nurse note - pt ems from home for right calf pain, swelling that started today. Pt s/p ivc filter 1 week ago. Was not started on blood thinners.

## 2017-09-17 NOTE — ED Notes (Signed)
Pt to the er after an episode today in her right leg with excessive swelling and pain. Pt has been dx with DVT and a IVC filter placed by Dr Lucky Cowboy over a week ago. Pt says the swelling has gone down but some mild swelling remains. Pt has some mild inflammation to the right inner leg at the knee. Some tenderness behind the right knee. Good pedal pulses palpated. Pt reported it was painful to walk earlier.

## 2017-09-17 NOTE — Telephone Encounter (Signed)
PATIENT CALLED IN & STATED SHE HAD SPOKEN TO SOMEONE ON Friday AND THE WHERE TO CALL HER THIS MORNING. IN THE SYSTEM IT SHOWS WHERE YOU SPOKE WITH HER. PLEASE CALL PATIENT

## 2017-09-17 NOTE — ED Provider Notes (Signed)
[facility] Emergency Department Provider Note  ____________________________________________   I have reviewed the triage vital signs and the nursing notes.   HISTORY  Chief Complaint Leg Swelling   History limited by: Not Limited   HPI Ann Larson is a 62 y.o. female who presents to the emergency department today because of concerns for worsening right leg swelling.  The patient was diagnosed with a DVT earlier this month.  She is initially treated with blood thinners however after a GI bleed was switched to an IVC filter.  Today she had an episode that rasped did roughly 20 minutes.  She had increasing pain and pulling sensation in her leg.  She felt like her veins started popping out like "the hulk".  It then it got better on its own.  She has not had any shortness of breath or chest pain.    Per medical record review patient has a history of dvt of right lower extremity with IVC filter placement.  Past Medical History:  Diagnosis Date  . Depression   . Diabetes mellitus without complication ([place])   . Diverticulitis   . DVT (deep vein thrombosis) in pregnancy ([place]) 08/29/2017   Right popliteal and femoral  . GERD (gastroesophageal reflux disease)   . Hyperlipidemia   . Hypertension   . Hypothyroidism   . Obesity     Patient Active Problem List   Diagnosis Date Noted  . Symptomatic anemia   . Heme + stool   . Blood in stool   . GI bleed 09/07/2017    Past Surgical History:  Procedure Laterality Date  . ABDOMINAL HYSTERECTOMY  2005  . CHOLECYSTECTOMY  2015  . COLONOSCOPY WITH PROPOFOL N/A 07/01/2015   Procedure: COLONOSCOPY WITH PROPOFOL;  Surgeon: Josefine Class, MD;  Location: [facility] ENDOSCOPY;  Service: Endoscopy;  Laterality: N/A;  . ESOPHAGOGASTRODUODENOSCOPY (EGD) WITH PROPOFOL N/A 07/01/2015   Procedure: ESOPHAGOGASTRODUODENOSCOPY (EGD) WITH PROPOFOL;  Surgeon: Josefine Class, MD;  Location: [facility] ENDOSCOPY;  Service:  Endoscopy;  Laterality: N/A;  . ESOPHAGOGASTRODUODENOSCOPY (EGD) WITH PROPOFOL N/A 09/08/2017   Procedure: ESOPHAGOGASTRODUODENOSCOPY (EGD) WITH PROPOFOL;  Surgeon: Lucilla Lame, MD;  Location: [facility] ENDOSCOPY;  Service: Endoscopy;  Laterality: N/A;  . IVC FILTER INSERTION N/A 09/08/2017   Procedure: IVC FILTER INSERTION;  Surgeon: Algernon Huxley, MD;  Location: [facility] LAB;  Service: Cardiovascular;  Laterality: N/A;    Prior to Admission medications   Medication Sig Start Date End Date Taking? Authorizing Provider  venlafaxine XR (EFFEXOR-XR) 75 MG 24 hr capsule Take 75 mg by mouth daily with breakfast.   Yes [provider]  ferrous sulfate 325 (65 FE) MG EC tablet Take 1 tablet (325 mg total) by mouth 2 (two) times daily. 09/09/17 11/08/17  Hillary Bow, MD  levothyroxine (SYNTHROID, LEVOTHROID) 125 MCG tablet Take 125 mcg by mouth daily before breakfast.    [provider]  lisinopril (PRINIVIL,ZESTRIL) 20 MG tablet Take 20 mg by mouth daily. 07/04/17   [provider]  metoprolol succinate (TOPROL-XL) 100 MG 24 hr tablet Take 50 mg by mouth daily. Take with or immediately following a meal.     [provider]  omeprazole (PRILOSEC) 40 MG capsule Take 1 capsule (40 mg total) by mouth 2 (two) times daily before a meal. 09/09/17   Sudini, Srikar, MD  pioglitazone (ACTOS) 45 MG tablet Take 45 mg by mouth daily. 06/18/17   [provider]  simvastatin (ZOCOR) 20 MG tablet Take 20 mg  by mouth daily.    [provider]  TOUJEO SOLOSTAR 300 UNIT/ML SOPN Inject 30 Units into the skin daily.  09/05/17   [provider]    Allergies Sulfa antibiotics  Family History  Problem Relation Age of Onset  . Deep vein thrombosis Neg Hx     Social History Social History   Tobacco Use  . Smoking status: Never Smoker  . Smokeless tobacco: Never Used  Substance Use Topics  . Alcohol use: No  . Drug use: No    Review of  Systems Constitutional: No fever/chills Eyes: No visual changes. ENT: No sore throat. Cardiovascular: Denies chest pain. Respiratory: Denies shortness of breath. Gastrointestinal: No abdominal pain.  No nausea, no vomiting.  No diarrhea.   Genitourinary: Negative for dysuria. Musculoskeletal: Positive for right lower leg swelling and pain. Skin: Negative for rash. Neurological: Negative for headaches, focal weakness or numbness.  ____________________________________________   PHYSICAL EXAM:  VITAL SIGNS: ED Triage Vitals [09/17/17 1615]  Enc Vitals Group     BP 135/70     Pulse Rate 80     Resp 20     Temp 99.2 F (37.3 C)     Temp Source Oral     SpO2 98 %     Weight 215 lb (97.5 kg)     Height 5\' 9"  (1.753 m)     Head Circumference      Peak Flow      Pain Score 10   Constitutional: Alert and oriented. Well appearing and in no distress. Eyes: Conjunctivae are normal.  ENT   Head: Normocephalic and atraumatic.   Nose: No congestion/rhinnorhea.   Mouth/Throat: Mucous membranes are moist.   Neck: No stridor. Hematological/Lymphatic/Immunilogical: No cervical lymphadenopathy. Cardiovascular: Normal rate, regular rhythm.  No murmurs, rubs, or gallops.  Respiratory: Normal respiratory effort without tachypnea nor retractions. Breath sounds are clear and equal bilaterally. No wheezes/rales/rhonchi. Gastrointestinal: Soft and non tender. No rebound. No guarding.  Genitourinary: Deferred Musculoskeletal: Normal range of motion in all extremities. Slight swelling to the right lower leg. DP 2+. Slight tenderness to palpation of calf and thigh, compartments soft.  Neurologic:  Normal speech and language. No gross focal neurologic deficits are appreciated.  Skin:  Skin is warm, dry and intact. No rash noted. Psychiatric: Mood and affect are normal. Speech and behavior are normal. Patient exhibits appropriate insight and  judgment.  ____________________________________________    LABS (pertinent positives/negatives)  CMP glu 170, cr 0.91 CBC wbc 7.3, hgb 9.8, plt 213  ____________________________________________   EKG  None  ____________________________________________    RADIOLOGY  [place] right lower extremity Propagation of clot  ____________________________________________   PROCEDURES  Procedures  ____________________________________________   INITIAL IMPRESSION / ASSESSMENT AND PLAN / ED COURSE  Pertinent labs & imaging results that were available during my care of the patient were reviewed by me and considered in my medical decision making (see chart for details).  Patient presented to the emergency department today because of concerns for right leg pain and swelling.  The time my exam there is slight swelling and tenderness.  Ultrasound does show propagation of blood clot.  Discussed with Dr. Lucky Cowboy who states she would not be a candidate for thrombectomy at this time.  Discussed with patient.  At this point will continue plan to have patient follow-up with Dr. Allen Norris with GI.  She will need to be cleared to restart blood thinning medications.  Discussed return precautions with the patient.    ____________________________________________  FINAL CLINICAL IMPRESSION(S) / ED DIAGNOSES  Final diagnoses:  Acute deep vein thrombosis (DVT) of proximal vein of right lower extremity ([place])     Note: This dictation was prepared with Dragon dictation. Any transcriptional errors that result from this process are unintentional     Nance Pear, MD 09/17/17 2250

## 2017-09-17 NOTE — Telephone Encounter (Signed)
PATIENT L/M ON ANSWERING SERVINS THAT SHE WAS IN THE EMERGENCY DEPARTMENT NOW.SHE WANTS TO BE CHECKED FOR SEVERAL THINGS.

## 2017-09-18 NOTE — Telephone Encounter (Signed)
Pt called today stating she was seen at the ER due to a DVT that has gotten bigger. Dr. Lucky Cowboy will not due a thrombectomy until pt clears from a GI standpoint. Pt had a bleeding ulcer that you treated with coagulation. She has not restarted her blood thinner due to your recommendation. She doesn't know what to do and is requesting you speak with Dr. Lucky Cowboy about her case. She would like to move forward with him to get this blood clot resolved.

## 2017-09-18 NOTE — Telephone Encounter (Signed)
Patient called and is still having pain. Please call.

## 2017-09-19 NOTE — Telephone Encounter (Signed)
Please set her up for repeat EGD to look at the area that was bleeding.

## 2017-09-20 ENCOUNTER — Other Ambulatory Visit: Payer: Self-pay

## 2017-09-20 ENCOUNTER — Encounter: Payer: Self-pay | Admitting: Anesthesiology

## 2017-09-20 ENCOUNTER — Telehealth (INDEPENDENT_AMBULATORY_CARE_PROVIDER_SITE_OTHER): Payer: Self-pay | Admitting: Vascular Surgery

## 2017-09-20 ENCOUNTER — Telehealth (INDEPENDENT_AMBULATORY_CARE_PROVIDER_SITE_OTHER): Payer: Self-pay

## 2017-09-20 DIAGNOSIS — K253 Acute gastric ulcer without hemorrhage or perforation: Secondary | ICD-10-CM

## 2017-09-20 NOTE — Telephone Encounter (Signed)
Patient called back asking about the instructions she was giving about elevation and wearing compression stockings.She verbalized that she was told that her legs had to be measure but I inform her she needed a prescription for a medical supply store but she also could go to other locations to get the stockings without rx.The patient started getting upset and asking was the doctor avoiding her and I told her he was not.I inform her that she needed to see her GI doctor to see when she could go back on her blood thinners and elevate legs and wear compression stockings.Patient will be coming by the office to pick up a prescription for her stockings 20-100mmHg

## 2017-09-20 NOTE — Telephone Encounter (Signed)
Called the patient back to let her know to contact her GI doctors to find out when she could start back on the blood thinners, and to wear the compression stockings and to elevate.

## 2017-09-20 NOTE — Telephone Encounter (Signed)
Pt added to [facility] schedule for tomorrow, 09/21/17 for repeat EGD for clearance to restart her blood thinner.

## 2017-09-20 NOTE — Discharge Summary (Signed)
[place] [facility] [place] NAME: Ann Larson    MR#:  [id number]  DATE OF BIRTH:  Mar 27, 1956  DATE OF ADMISSION:  09/07/2017 ADMITTING PHYSICIAN: Hillary Bow, MD  DATE OF DISCHARGE: 09/09/2017  1:01 PM  PRIMARY CARE PHYSICIAN: Lorelee Market, MD   ADMISSION DIAGNOSIS:  Medication side effect, initial encounter [T50.905A] Gastrointestinal hemorrhage with melena [K92.1] Symptomatic anemia [D64.9]  DISCHARGE DIAGNOSIS:  Active Problems:   GI bleed   Symptomatic anemia   Heme + stool   Blood in stool   SECONDARY DIAGNOSIS:   Past Medical History:  Diagnosis Date  . Depression   . Diabetes mellitus without complication ([place])   . Diverticulitis   . DVT (deep vein thrombosis) in pregnancy ([place]) 08/29/2017   Right popliteal and femoral  . GERD (gastroesophageal reflux disease)   . Hyperlipidemia   . Hypertension   . Hypothyroidism   . Obesity      ADMITTING HISTORY  HISTORY OF PRESENT ILLNESS:  Ann Larson  is a 62 y.o. female with a known history of diabetes, hypertension, gastritis, diverticulosis with recently diagnosed right popliteal and distal femoral DVT on Xarelto return to the emergency room due to fatigue, dizziness and shortness of breath.  Her hemoglobin is 7.4 with baseline hemoglobin at 13.  She has noticed melena.  Here in the emergency room patient stool is positive for blood.  Patient is being admitted for GI bleed. She has no chest pain, orthopnea.  She had right calf tenderness on recent ER presentation which has resolved. Patient had EGD and colonoscopy in the past with polyps, diverticulosis and gastritis.  Also history of pancreatitis.  No prior GI bleed problems.  She does have chronic right upper quadrant and epigastric pain on and off.  This is worse with eating.  She has had cholecystectomy.   HOSPITAL COURSE:   *Upper GI bleed with acute blood loss anemia due to Hemorrhagic  gastropathy. Patient's hemoglobin stabilized.  Bleeding resolved.  Patient symptoms improved by the day of discharge.  Started on PPIs.  Iron supplements.  Patient is tolerating liquid diet and will be advanced to soft diet and was discharged home in stable condition to follow-up with GI for repeat EGD as outpatient.  *Lower extremity DVT.  Patient was on Eliquis which was stopped due to GI bleed.  An IVC filter was placed by Dr. Lucky Cowboy.  Patient will follow-up with vascular surgery in 4-6 weeks.  Other comorbidities remained stable.  CONSULTS OBTAINED:  Treatment Team:  Lucilla Lame, MD  DRUG ALLERGIES:   Allergies  Allergen Reactions  . Sulfa Antibiotics     DISCHARGE MEDICATIONS:   Allergies as of 09/09/2017      Reactions   Sulfa Antibiotics       Medication List    STOP taking these medications   ondansetron 4 MG disintegrating tablet Commonly known as:  ZOFRAN ODT   Rivaroxaban 15 & 20 MG Tbpk     TAKE these medications   ferrous sulfate 325 (65 FE) MG EC tablet Take 1 tablet (325 mg total) by mouth 2 (two) times daily.   levothyroxine 125 MCG tablet Commonly known as:  SYNTHROID, LEVOTHROID Take 125 mcg by mouth daily before breakfast.   lisinopril 20 MG tablet Commonly known as:  PRINIVIL,ZESTRIL Take 20 mg by mouth daily.   metoprolol succinate 100 MG 24 hr tablet Commonly known as:  TOPROL-XL Take 50 mg by mouth daily. Take with or immediately following  a meal.   omeprazole 40 MG capsule Commonly known as:  PRILOSEC Take 1 capsule (40 mg total) by mouth 2 (two) times daily before a meal. What changed:    medication strength  how much to take  when to take this   pioglitazone 45 MG tablet Commonly known as:  ACTOS Take 45 mg by mouth daily.   simvastatin 20 MG tablet Commonly known as:  ZOCOR Take 20 mg by mouth daily.   TOUJEO SOLOSTAR 300 UNIT/ML Sopn Generic drug:  Insulin Glargine Inject 30 Units into the skin daily.   venlafaxine XR  75 MG 24 hr capsule Commonly known as:  EFFEXOR-XR Take 75 mg by mouth daily with breakfast.       Today   VITAL SIGNS:  Blood pressure (!) 145/67, pulse 86, temperature 97.9 F (36.6 C), temperature source Oral, resp. rate 18, height 5\' 9"  (1.753 m), weight 97.5 kg (215 lb), SpO2 98 %.  I/O:  No intake or output data in the 24 hours ending 09/20/17 1714  PHYSICAL EXAMINATION:  Physical Exam  GENERAL:  62 y.o.-year-old patient lying in the bed with no acute distress.  LUNGS: Normal breath sounds bilaterally, no wheezing, rales,rhonchi or crepitation. No use of accessory muscles of respiration.  CARDIOVASCULAR: S1, S2 normal. No murmurs, rubs, or gallops.  ABDOMEN: Soft, non-tender, non-distended. Bowel sounds present. No organomegaly or mass.  NEUROLOGIC: Moves all 4 extremities. PSYCHIATRIC: The patient is alert and oriented x 3.  SKIN: No obvious rash, lesion, or ulcer.   DATA REVIEW:   CBC Recent Labs  Lab 09/17/17 1617  WBC 7.3  HGB 9.8*  HCT 30.5*  PLT 213    Chemistries  Recent Labs  Lab 09/17/17 1617  NA 137  K 3.8  CL 104  CO2 22  GLUCOSE 170*  BUN 13  CREATININE 0.91  CALCIUM 10.6*  AST 24  ALT 17  ALKPHOS 101  BILITOT 0.3    Cardiac Enzymes No results for input(s): TROPONINI in the last 168 hours.  Microbiology Results  No results found for this or any previous visit.  RADIOLOGY:  No results found.  Follow up with PCP in 1 week.  Management plans discussed with the patient, family and they are in agreement.  CODE STATUS:  Code Status History    Date Active Date Inactive Code Status Order ID Comments User Context   09/07/2017 16:09 09/09/2017 16:37 Full Code [id number]  Hillary Bow, MD ED      TOTAL TIME TAKING CARE OF THIS PATIENT ON DAY OF DISCHARGE: more than 30 minutes.   Leia Alf Elberta Lachapelle M.D on 09/20/2017 at 5:14 PM  Between 7am to 6pm - Pager - [phone]  After 6pm go to [email or web address] - password [email or web address] [facility]  Office  [phone]  CC: Primary care physician; Lorelee Market, MD  Note: This dictation was prepared with Dragon dictation along with smaller phrase technology. Any transcriptional errors that result from this process are unintentional.

## 2017-09-20 NOTE — Telephone Encounter (Signed)
New Message  Pt verbalized she is needing to speak to MD or nurse in regards to IVC filter.   Pt verbalized it is giving her complications and her right leg is swollen and twice the size of the other leg.  Pt verbalized she was at ED two days ago.  Please f/u with pt

## 2017-09-21 ENCOUNTER — Ambulatory Visit
Admission: RE | Admit: 2017-09-21 | Payer: BLUE CROSS/BLUE SHIELD | Source: Ambulatory Visit | Admitting: Gastroenterology

## 2017-09-21 ENCOUNTER — Encounter: Admission: RE | Payer: Self-pay | Source: Ambulatory Visit

## 2017-09-21 ENCOUNTER — Encounter
Admission: RE | Disposition: A | Discharge status: Home / Self Care | Payer: Self-pay | Source: Ambulatory Visit | Attending: Gastroenterology

## 2017-09-21 ENCOUNTER — Ambulatory Visit: Payer: BLUE CROSS/BLUE SHIELD | Admitting: Anesthesiology

## 2017-09-21 ENCOUNTER — Ambulatory Visit
Admission: RE | Admit: 2017-09-21 | Discharge: 2017-09-21 | Disposition: A | Discharge status: Home / Self Care | Payer: BLUE CROSS/BLUE SHIELD | Source: Ambulatory Visit | Attending: Gastroenterology | Admitting: Gastroenterology

## 2017-09-21 DIAGNOSIS — E669 Obesity, unspecified: Secondary | ICD-10-CM | POA: Diagnosis not present

## 2017-09-21 DIAGNOSIS — Z6831 Body mass index (BMI) 31.0-31.9, adult: Secondary | ICD-10-CM | POA: Diagnosis not present

## 2017-09-21 DIAGNOSIS — I82501 Chronic embolism and thrombosis of unspecified deep veins of right lower extremity: Secondary | ICD-10-CM | POA: Diagnosis not present

## 2017-09-21 DIAGNOSIS — Z8719 Personal history of other diseases of the digestive system: Secondary | ICD-10-CM | POA: Diagnosis not present

## 2017-09-21 DIAGNOSIS — Z79899 Other long term (current) drug therapy: Secondary | ICD-10-CM | POA: Diagnosis not present

## 2017-09-21 DIAGNOSIS — K2971 Gastritis, unspecified, with bleeding: Secondary | ICD-10-CM

## 2017-09-21 DIAGNOSIS — Z8711 Personal history of peptic ulcer disease: Secondary | ICD-10-CM | POA: Diagnosis not present

## 2017-09-21 DIAGNOSIS — Z7984 Long term (current) use of oral hypoglycemic drugs: Secondary | ICD-10-CM | POA: Diagnosis not present

## 2017-09-21 DIAGNOSIS — K2950 Unspecified chronic gastritis without bleeding: Secondary | ICD-10-CM | POA: Diagnosis not present

## 2017-09-21 DIAGNOSIS — K219 Gastro-esophageal reflux disease without esophagitis: Secondary | ICD-10-CM | POA: Insufficient documentation

## 2017-09-21 DIAGNOSIS — E785 Hyperlipidemia, unspecified: Secondary | ICD-10-CM | POA: Diagnosis not present

## 2017-09-21 DIAGNOSIS — Z882 Allergy status to sulfonamides status: Secondary | ICD-10-CM | POA: Insufficient documentation

## 2017-09-21 DIAGNOSIS — K253 Acute gastric ulcer without hemorrhage or perforation: Secondary | ICD-10-CM

## 2017-09-21 DIAGNOSIS — E039 Hypothyroidism, unspecified: Secondary | ICD-10-CM | POA: Insufficient documentation

## 2017-09-21 DIAGNOSIS — I10 Essential (primary) hypertension: Secondary | ICD-10-CM | POA: Insufficient documentation

## 2017-09-21 DIAGNOSIS — Z09 Encounter for follow-up examination after completed treatment for conditions other than malignant neoplasm: Secondary | ICD-10-CM | POA: Diagnosis present

## 2017-09-21 DIAGNOSIS — E119 Type 2 diabetes mellitus without complications: Secondary | ICD-10-CM | POA: Insufficient documentation

## 2017-09-21 DIAGNOSIS — F329 Major depressive disorder, single episode, unspecified: Secondary | ICD-10-CM | POA: Insufficient documentation

## 2017-09-21 HISTORY — PX: ESOPHAGOGASTRODUODENOSCOPY (EGD) WITH PROPOFOL: SHX5813

## 2017-09-21 SURGERY — ESOPHAGOGASTRODUODENOSCOPY (EGD) WITH PROPOFOL
Anesthesia: General

## 2017-09-21 SURGERY — ESOPHAGOGASTRODUODENOSCOPY (EGD) WITH PROPOFOL
Anesthesia: Choice

## 2017-09-21 MED ORDER — PROPOFOL 500 MG/50ML IV EMUL
INTRAVENOUS | Status: DC | PRN
  Administered 2017-09-21: 160 ug/kg/min via INTRAVENOUS

## 2017-09-21 MED ORDER — LIDOCAINE HCL (CARDIAC) 20 MG/ML IV SOLN
INTRAVENOUS | Status: DC | PRN
  Administered 2017-09-21: 60 mg via INTRAVENOUS

## 2017-09-21 MED ORDER — GLYCOPYRROLATE 0.2 MG/ML IJ SOLN
INTRAMUSCULAR | Status: DC | PRN
  Administered 2017-09-21: 0.2 mg via INTRAVENOUS

## 2017-09-21 MED ORDER — PROPOFOL 10 MG/ML IV BOLUS
INTRAVENOUS | Status: DC | PRN
  Administered 2017-09-21: 70 mg via INTRAVENOUS

## 2017-09-21 MED ORDER — SODIUM CHLORIDE 0.9 % IV SOLN: INTRAVENOUS | Status: DC

## 2017-09-21 MED ORDER — MIDAZOLAM HCL 2 MG/2ML IJ SOLN
INTRAMUSCULAR | Status: DC | PRN
  Administered 2017-09-21: 1 mg via INTRAVENOUS

## 2017-09-21 MED ORDER — SODIUM CHLORIDE 0.9 % IV SOLN
INTRAVENOUS | Status: DC
  Administered 2017-09-21: 1000 mL via INTRAVENOUS

## 2017-09-21 NOTE — Transfer of Care (Addendum)
Immediate Anesthesia Transfer of Care Note  Patient: Ann Larson  Procedure(s) Performed: ESOPHAGOGASTRODUODENOSCOPY (EGD) WITH PROPOFOL (N/A )  Patient Location: PACU  Anesthesia Type:General  Level of Consciousness: sedated  Airway & Oxygen Therapy: Patient Spontanous Breathing and Patient connected to nasal cannula oxygen  Post-op Assessment: Report given to RN and Post -op Vital signs reviewed and stable  Post vital signs: Reviewed and stable  Last Vitals:  Vitals:   09/21/17 1255 09/21/17 1535  BP: (!) 149/58 138/75  Pulse: 91 98  Resp: 20 (!) 27  Temp: 37.1 C   SpO2: 98% 100%    Last Pain:  Vitals:   09/21/17 1255  TempSrc: Tympanic  PainSc: 7          Complications: No apparent anesthesia complications

## 2017-09-21 NOTE — Anesthesia Preprocedure Evaluation (Signed)
Anesthesia Evaluation  Patient identified by MRN, date of birth, ID band Patient awake    Reviewed: Allergy & Precautions, NPO status , Patient's Chart, lab work & pertinent test results  History of Anesthesia Complications Negative for: history of anesthetic complications  Airway Mallampati: III  TM Distance: >3 FB Neck ROM: Full    Dental no notable dental hx.    Pulmonary neg pulmonary ROS, sleep apnea and Continuous Positive Airway Pressure Ventilation , COPD,    breath sounds clear to auscultation- rhonchi (-) wheezing      Cardiovascular hypertension, Pt. on medications + DVT (diagnosed in January, not on blood thinners because of GI bleeding)  (-) CAD, (-) Past MI and (-) Cardiac Stents  Rhythm:Regular Rate:Normal - Systolic murmurs and - Diastolic murmurs    Neuro/Psych PSYCHIATRIC DISORDERS Depression negative neurological ROS     GI/Hepatic Neg liver ROS, GERD  ,  Endo/Other  diabetes, Oral Hypoglycemic AgentsHypothyroidism   Renal/GU negative Renal ROS     Musculoskeletal negative musculoskeletal ROS (+)   Abdominal (+) + obese,   Peds  Hematology  (+) anemia ,   Anesthesia Other Findings Past Medical History: No date: Depression No date: Diabetes mellitus without complication (HCC) No date: Diverticulitis 08/29/2017: DVT (deep vein thrombosis) in pregnancy ([place])     Comment:  Right popliteal and femoral No date: GERD (gastroesophageal reflux disease) No date: Hyperlipidemia No date: Hypertension No date: Hypothyroidism No date: Obesity   Reproductive/Obstetrics                             Anesthesia Physical Anesthesia Plan  ASA: III  Anesthesia Plan: General   Post-op Pain Management:    Induction: Intravenous  PONV Risk Score and Plan: 2 and Propofol infusion  Airway Management Planned: Natural Airway  Additional Equipment:   Intra-op Plan:    Post-operative Plan:   Informed Consent: I have reviewed the patients History and Physical, chart, labs and discussed the procedure including the risks, benefits and alternatives for the proposed anesthesia with the patient or authorized representative who has indicated his/her understanding and acceptance.   Dental advisory given  Plan Discussed with: CRNA and Anesthesiologist  Anesthesia Plan Comments:         Anesthesia Quick Evaluation

## 2017-09-21 NOTE — Anesthesia Post-op Follow-up Note (Signed)
Anesthesia QCDR form completed.        

## 2017-09-21 NOTE — Op Note (Signed)
[facility] Patient Name: Ann Larson Procedure Date: 09/21/2017 1:33 PM MRN: [id number] Account #: [id number] Date of Birth: Oct 18, 1955 Admit Type: Outpatient Age: 62 Room: [facility] ENDO ROOM 3 Gender: Female Note Status: Finalized Procedure:            Upper GI endoscopy Indications:          Personal history of peptic ulcer disease Providers:            Lucilla Lame MD, MD Referring MD:         Meindert A. Brunetta Genera, MD (Referring MD) Medicines:            Propofol per Anesthesia Complications:        No immediate complications. Procedure:            Pre-Anesthesia Assessment:                       - Prior to the procedure, a History and Physical was                        performed, and patient medications and allergies were                        reviewed. The patient's tolerance of previous                        anesthesia was also reviewed. The risks and benefits of                        the procedure and the sedation options and risks were                        discussed with the patient. All questions were                        answered, and informed consent was obtained. Prior                        Anticoagulants: The patient has taken no previous                        anticoagulant or antiplatelet agents. ASA Grade                        Assessment: II - A patient with mild systemic disease.                        After reviewing the risks and benefits, the patient was                        deemed in satisfactory condition to undergo the                        procedure.                       After obtaining informed consent, the endoscope was                        passed under direct vision. Throughout the procedure,  the patient's blood pressure, pulse, and oxygen                        saturations were monitored continuously. The Endoscope                        was introduced through the mouth, and  advanced to the                        second part of duodenum. The upper GI endoscopy was                        accomplished without difficulty. The patient tolerated                        the procedure well. Findings:      The examined esophagus was normal.      Diffuse moderate inflammation with hemorrhage characterized by erythema,       friability and granularity was found in the cardia, in the gastric       fundus and in the gastric body. Coagulation for hemostasis using argon       plasma at 2 liters/minute and 30 watts was successful. Biopsies were       taken with a cold forceps for histology. Impression:           - Normal esophagus.                       - Gastritis with hemorrhage. Treated with argon plasma                        coagulation (APC).                       - No specimens collected. Recommendation:       - Discharge patient to home.                       - Resume previous diet.                       - Continue present medications.                       - Await pathology results. Procedure Code(s):    --- Professional ---                       27035, 44, Esophagogastroduodenoscopy, flexible,                        transoral; with control of bleeding, any method                       43239, Esophagogastroduodenoscopy, flexible, transoral;                        with biopsy, single or multiple Diagnosis Code(s):    --- Professional ---                       Z87.11, Personal history of peptic ulcer disease  K29.71, Gastritis, unspecified, with bleeding CPT copyright 2016 American Medical Association. All rights reserved. The codes documented in this report are preliminary and upon coder review may  be revised to meet current compliance requirements. Lucilla Lame MD, MD 09/21/2017 3:35:50 PM This report has been signed electronically. Number of Addenda: 0 Note Initiated On: 09/21/2017 1:33 PM      [facility]

## 2017-09-21 NOTE — Progress Notes (Signed)
Patient concerned about her right leg DVT and asking for pain medicine for home from Dr. Allen Norris. Dr. Allen Norris referred her to the doctor that has been treating the DVT and/or her medical doctor. Patient says she has tried to call and get and appointment with Dr. Lucky Cowboy for follow up. See notes. She has not gotten the TED hose from medical supply because she doesn't know where to go to get these.  I called Dr. Bunnie Domino office and explained the situation. Dr. Bunnie Domino office has given her an appointment for next Wednesday. I wrote down medical supply stores and addresses in [place] for her to get the TED hose. She and her family understand these instructions.

## 2017-09-21 NOTE — H&P (Signed)
Lucilla Lame, MD [address] [place],  [address] Phone:[phone] Fax : [phone]  Primary Care Physician:  Lorelee Market, MD Primary Gastroenterologist:  Dr. Allen Norris  Pre-Procedure History & Physical: HPI:  Ann Larson is a 62 y.o. female is here for an endoscopy.   Past Medical History:  Diagnosis Date  . Depression   . Diabetes mellitus without complication ([place])   . Diverticulitis   . DVT (deep vein thrombosis) in pregnancy ([place]) 08/29/2017   Right popliteal and femoral  . GERD (gastroesophageal reflux disease)   . Hyperlipidemia   . Hypertension   . Hypothyroidism   . Obesity     Past Surgical History:  Procedure Laterality Date  . ABDOMINAL HYSTERECTOMY  2005  . CHOLECYSTECTOMY  2015  . COLONOSCOPY WITH PROPOFOL N/A 07/01/2015   Procedure: COLONOSCOPY WITH PROPOFOL;  Surgeon: Josefine Class, MD;  Location: [facility] ENDOSCOPY;  Service: Endoscopy;  Laterality: N/A;  . ESOPHAGOGASTRODUODENOSCOPY (EGD) WITH PROPOFOL N/A 07/01/2015   Procedure: ESOPHAGOGASTRODUODENOSCOPY (EGD) WITH PROPOFOL;  Surgeon: Josefine Class, MD;  Location: [facility] ENDOSCOPY;  Service: Endoscopy;  Laterality: N/A;  . ESOPHAGOGASTRODUODENOSCOPY (EGD) WITH PROPOFOL N/A 09/08/2017   Procedure: ESOPHAGOGASTRODUODENOSCOPY (EGD) WITH PROPOFOL;  Surgeon: Lucilla Lame, MD;  Location: [facility] ENDOSCOPY;  Service: Endoscopy;  Laterality: N/A;  . IVC FILTER INSERTION N/A 09/08/2017   Procedure: IVC FILTER INSERTION;  Surgeon: Algernon Huxley, MD;  Location: [place] CV LAB;  Service: Cardiovascular;  Laterality: N/A;    Prior to Admission medications   Medication Sig Start Date End Date Taking? Authorizing Provider  ferrous sulfate 325 (65 FE) MG EC tablet Take 1 tablet (325 mg total) by mouth 2 (two) times daily. 09/09/17 11/08/17 Yes Sudini, Alveta Heimlich, MD  levothyroxine (SYNTHROID, LEVOTHROID) 125 MCG tablet Take 125 mcg by mouth daily before breakfast.   Yes [provider]  lisinopril (PRINIVIL,ZESTRIL) 20 MG tablet Take 20 mg by mouth daily. 07/04/17  Yes [provider]  metoprolol succinate (TOPROL-XL) 100 MG 24 hr tablet Take 50 mg by mouth daily. Take with or immediately following a meal.    Yes [provider]  omeprazole (PRILOSEC) 40 MG capsule Take 1 capsule (40 mg total) by mouth 2 (two) times daily before a meal. 09/09/17  Yes Sudini, Srikar, MD  pioglitazone (ACTOS) 45 MG tablet Take 45 mg by mouth daily. 06/18/17  Yes [provider]  simvastatin (ZOCOR) 20 MG tablet Take 20 mg by mouth daily.   Yes [provider]  TOUJEO SOLOSTAR 300 UNIT/ML SOPN Inject 30 Units into the skin daily.  09/05/17  Yes [provider]  venlafaxine XR (EFFEXOR-XR) 75 MG 24 hr capsule Take 75 mg by mouth daily with breakfast.   Yes [provider]    Allergies as of 09/20/2017 - Review Complete 09/20/2017  Allergen Reaction Noted  . Sulfa antibiotics  06/30/2015    Family History  Problem Relation Age of Onset  . Deep vein thrombosis Neg Hx     Social History   Socioeconomic History  . Marital status: Married    Spouse name: Not on file  . Number of children: Not on file  . Years of education: Not on file  . Highest education level: Not on file  Social Needs  . Financial resource strain: Not on file  . Food insecurity - worry: Not on file  . Food insecurity - inability: Not on file  . Transportation needs - medical: Not on file  .  Transportation needs - non-medical: Not on file  Occupational History  . Not on file  Tobacco Use  . Smoking status: Never Smoker  . Smokeless tobacco: Never Used  Substance and Sexual Activity  . Alcohol use: No  . Drug use: No  . Sexual activity: Not on file  Other Topics Concern  . Not on file  Social History Narrative  . Not on file    Review of Systems: See HPI, otherwise negative ROS  Physical Exam: BP (!) 149/58   Pulse 91   Temp 98.8 F  (37.1 C) (Tympanic)   Resp 20   Ht 5\' 9"  (1.753 m)   Wt 215 lb (97.5 kg)   SpO2 98%   BMI 31.75 kg/m  General:   Alert,  pleasant and cooperative in NAD Head:  Normocephalic and atraumatic. Neck:  Supple; no masses or thyromegaly. Lungs:  Clear throughout to auscultation.    Heart:  Regular rate and rhythm. Abdomen:  Soft, nontender and nondistended. Normal bowel sounds, without guarding, and without rebound.   Neurologic:  Alert and  oriented x4;  grossly normal neurologically.  Impression/Plan: Ann Larson is here for an endoscopy to be performed for follow PUD.  Risks, benefits, limitations, and alternatives regarding  colonoscopy have been reviewed with the patient.  Questions have been answered.  All parties agreeable.   Lucilla Lame, MD  09/21/2017, 2:55 PM

## 2017-09-21 NOTE — H&P (Signed)
[facility] [place] NAME: Ann Larson    MR#:  [id number]  DATE OF BIRTH:  24-Oct-1955  DATE OF ADMISSION:  09/21/2017  PRIMARY CARE PHYSICIAN: Lorelee Market, MD   REQUESTING/REFERRING PHYSICIAN:   CHIEF COMPLAINT:  No chief complaint on file.   HISTORY OF PRESENT ILLNESS: Ann Larson  is a 62 y.o. female with a known history of Right DVT was on oral anticoagulation - discontinued due to acute GIB w/ symptomatic anemia requiring recent blood transfusion, EGD done note for upper GIB, had IVC placed as a result by Dr. Lucky Cowboy, now here for repeat EGD, hospitalist asked to see regarding right leg swelling/pain, repeat U/S noted for enlarging DVT, pt c/o pain/swelling, family at bedside.  PAST MEDICAL HISTORY:   Past Medical History:  Diagnosis Date  . Depression   . Diabetes mellitus without complication ([place])   . Diverticulitis   . DVT (deep vein thrombosis) in pregnancy ([place]) 08/29/2017   Right popliteal and femoral  . GERD (gastroesophageal reflux disease)   . Hyperlipidemia   . Hypertension   . Hypothyroidism   . Obesity     PAST SURGICAL HISTORY:  Past Surgical History:  Procedure Laterality Date  . ABDOMINAL HYSTERECTOMY  2005  . CHOLECYSTECTOMY  2015  . COLONOSCOPY WITH PROPOFOL N/A 07/01/2015   Procedure: COLONOSCOPY WITH PROPOFOL;  Surgeon: Josefine Class, MD;  Location: [facility] ENDOSCOPY;  Service: Endoscopy;  Laterality: N/A;  . ESOPHAGOGASTRODUODENOSCOPY (EGD) WITH PROPOFOL N/A 07/01/2015   Procedure: ESOPHAGOGASTRODUODENOSCOPY (EGD) WITH PROPOFOL;  Surgeon: Josefine Class, MD;  Location: [facility] ENDOSCOPY;  Service: Endoscopy;  Laterality: N/A;  . ESOPHAGOGASTRODUODENOSCOPY (EGD) WITH PROPOFOL N/A 09/08/2017   Procedure: ESOPHAGOGASTRODUODENOSCOPY (EGD) WITH PROPOFOL;  Surgeon: Lucilla Lame, MD;  Location: [facility] ENDOSCOPY;  Service: Endoscopy;  Laterality: N/A;  . IVC FILTER INSERTION N/A 09/08/2017    Procedure: IVC FILTER INSERTION;  Surgeon: Algernon Huxley, MD;  Location: [place] CV LAB;  Service: Cardiovascular;  Laterality: N/A;    SOCIAL HISTORY:  Social History   Tobacco Use  . Smoking status: Never Smoker  . Smokeless tobacco: Never Used  Substance Use Topics  . Alcohol use: No    FAMILY HISTORY:  Family History  Problem Relation Age of Onset  . Deep vein thrombosis Neg Hx     DRUG ALLERGIES:  Allergies  Allergen Reactions  . Sulfa Antibiotics     REVIEW OF SYSTEMS:   CONSTITUTIONAL: No fever, fatigue or weakness.  EYES: No blurred or double vision.  EARS, NOSE, AND THROAT: No tinnitus or ear pain.  RESPIRATORY: No cough, shortness of breath, wheezing or hemoptysis.  CARDIOVASCULAR: No chest pain, orthopnea, edema.  GASTROINTESTINAL: No nausea, vomiting, diarrhea or abdominal pain.  GENITOURINARY: No dysuria, hematuria.  ENDOCRINE: No polyuria, nocturia,  HEMATOLOGY: No anemia, easy bruising or bleeding SKIN: No rash or lesion. MUSCULOSKELETAL: rt LE pain and swelling   NEUROLOGIC: No tingling, numbness, weakness.  PSYCHIATRY: No anxiety or depression.   MEDICATIONS AT HOME:  Prior to Admission medications   Medication Sig Start Date End Date Taking? Authorizing Provider  ferrous sulfate 325 (65 FE) MG EC tablet Take 1 tablet (325 mg total) by mouth 2 (two) times daily. 09/09/17 11/08/17 Yes Sudini, Alveta Heimlich, MD  levothyroxine (SYNTHROID, LEVOTHROID) 125 MCG tablet Take 125 mcg by mouth daily before breakfast.   Yes [provider]  lisinopril (PRINIVIL,ZESTRIL) 20 MG tablet Take 20 mg by mouth daily. 07/04/17  Yes [provider]  metoprolol succinate (TOPROL-XL) 100 MG 24 hr tablet Take 50 mg by mouth daily. Take with or immediately following a meal.    Yes [provider]  omeprazole (PRILOSEC) 40 MG capsule Take 1 capsule (40 mg total) by mouth 2 (two) times daily before a meal. 09/09/17  Yes Sudini, Srikar, MD  pioglitazone  (ACTOS) 45 MG tablet Take 45 mg by mouth daily. 06/18/17  Yes [provider]  simvastatin (ZOCOR) 20 MG tablet Take 20 mg by mouth daily.   Yes [provider]  TOUJEO SOLOSTAR 300 UNIT/ML SOPN Inject 30 Units into the skin daily.  09/05/17  Yes [provider]  venlafaxine XR (EFFEXOR-XR) 75 MG 24 hr capsule Take 75 mg by mouth daily with breakfast.   Yes [provider]      PHYSICAL EXAMINATION:   VITAL SIGNS: Blood pressure (!) 149/58, pulse 91, temperature 98.8 F (37.1 C), temperature source Tympanic, resp. rate 20, height 5\' 9"  (1.753 m), weight 97.5 kg (215 lb), SpO2 98 %.  GENERAL:  62 y.o.-year-old patient lying in the bed with no acute distress.  EYES: Pupils equal, round, reactive to light and accommodation. No scleral icterus. Extraocular muscles intact.  HEENT: Head atraumatic, normocephalic. Oropharynx and nasopharynx clear.  NECK:  Supple, no jugular venous distention. No thyroid enlargement, no tenderness.  LUNGS: Normal breath sounds bilaterally, no wheezing, rales,rhonchi or crepitation. No use of accessory muscles of respiration.  CARDIOVASCULAR: S1, S2 normal. No murmurs, rubs, or gallops.  ABDOMEN: Soft, nontender, nondistended. Bowel sounds present. No organomegaly or mass.  EXTREMITIES: Rt LE edema, no cyanosis, or clubbing.  NEUROLOGIC: Cranial nerves II through XII are intact. MAES. Gait not checked.  PSYCHIATRIC: The patient is alert and oriented x 3.  SKIN: No obvious rash, lesion, or ulcer.   LABORATORY PANEL:   CBC Recent Labs  Lab 09/17/17 1617  WBC 7.3  HGB 9.8*  HCT 30.5*  PLT 213  MCV 95.2  MCH 30.6  MCHC 32.1  RDW 19.2*   ------------------------------------------------------------------------------------------------------------------  Chemistries  Recent Labs  Lab 09/17/17 1617  NA 137  K 3.8  CL 104  CO2 22  GLUCOSE 170*  BUN 13  CREATININE 0.91  CALCIUM 10.6*  AST 24  ALT 17  ALKPHOS 101   BILITOT 0.3   ------------------------------------------------------------------------------------------------------------------ estimated creatinine clearance is 80.7 mL/min (by C-G formula based on SCr of 0.91 mg/dL). ------------------------------------------------------------------------------------------------------------------ No results for input(s): TSH, T4TOTAL, T3FREE, THYROIDAB in the last 72 hours.  Invalid input(s): FREET3   Coagulation profile No results for input(s): INR, PROTIME in the last 168 hours. ------------------------------------------------------------------------------------------------------------------- No results for input(s): DDIMER in the last 72 hours. -------------------------------------------------------------------------------------------------------------------  Cardiac Enzymes No results for input(s): CKMB, TROPONINI, MYOGLOBIN in the last 168 hours.  Invalid input(s): CK ------------------------------------------------------------------------------------------------------------------ Invalid input(s): POCBNP  ---------------------------------------------------------------------------------------------------------------  Urinalysis    Component Value Date/Time   COLORURINE YELLOW 02/06/2017 2306   APPEARANCEUR CLEAR 02/06/2017 2306   LABSPEC 1.020 02/06/2017 2306   PHURINE 5.0 02/06/2017 2306   GLUCOSEU 500 (A) 02/06/2017 2306   HGBUR NEGATIVE 02/06/2017 2306   BILIRUBINUR NEGATIVE 02/06/2017 2306   KETONESUR NEGATIVE 02/06/2017 2306   PROTEINUR NEGATIVE 02/06/2017 2306   NITRITE NEGATIVE 02/06/2017 2306   LEUKOCYTESUR NEGATIVE 02/06/2017 2306     RADIOLOGY: No results found.  EKG: Orders placed or performed during the hospital encounter of 09/07/17  . EKG 12-Lead  . EKG 12-Lead  . ED EKG within 10 minutes  . ED EKG within 10  minutes  . 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  Allen Norris, pt, pt's husband/sister, okay to proceed with EGD which would provide good information regarding possible anticoagulation going forward, pt w/ IVC filter in place 08/2017 so pt is treated, all questions answered  2 Acute on chronic LE pain Would suggest non-NSAID/aspirin containing agents for pain control and have pt f/u with PCP for continued care/management   All the records are reviewed and case discussed with ED provider. Management plans discussed with the patient, family and they are in agreement.  CODE STATUS: Code Status History    Date Active Date Inactive Code Status Order ID Comments User Context   09/07/2017 16:09 09/09/2017 16:37 Full Code [id number]  Hillary Bow, MD ED       TOTAL TIME TAKING CARE OF THIS PATIENT: 45 minutes.    Avel Peace Salary M.D on 09/21/2017   Between 7am to 6pm - Pager - [phone]  After 6pm go to [email or web address] - password [email or web address] [place] [facility]  Office  [phone]  CC: Primary care physician; Lorelee Market, MD   Note: This dictation was prepared with Dragon dictation along with smaller phrase technology. Any transcriptional errors that result from this process are unintentional.

## 2017-09-23 ENCOUNTER — Other Ambulatory Visit: Payer: Self-pay

## 2017-09-23 ENCOUNTER — Emergency Department: Payer: BLUE CROSS/BLUE SHIELD

## 2017-09-23 ENCOUNTER — Emergency Department
Admission: EM | Admit: 2017-09-23 | Discharge: 2017-09-23 | Disposition: A | Discharge status: Home / Self Care | Payer: BLUE CROSS/BLUE SHIELD | Attending: Emergency Medicine | Admitting: Emergency Medicine

## 2017-09-23 DIAGNOSIS — Z79899 Other long term (current) drug therapy: Secondary | ICD-10-CM | POA: Insufficient documentation

## 2017-09-23 DIAGNOSIS — E119 Type 2 diabetes mellitus without complications: Secondary | ICD-10-CM | POA: Insufficient documentation

## 2017-09-23 DIAGNOSIS — I82511 Chronic embolism and thrombosis of right femoral vein: Secondary | ICD-10-CM | POA: Diagnosis not present

## 2017-09-23 DIAGNOSIS — I10 Essential (primary) hypertension: Secondary | ICD-10-CM | POA: Insufficient documentation

## 2017-09-23 DIAGNOSIS — Z794 Long term (current) use of insulin: Secondary | ICD-10-CM | POA: Insufficient documentation

## 2017-09-23 DIAGNOSIS — E039 Hypothyroidism, unspecified: Secondary | ICD-10-CM | POA: Diagnosis not present

## 2017-09-23 DIAGNOSIS — M79604 Pain in right leg: Secondary | ICD-10-CM | POA: Diagnosis present

## 2017-09-23 LAB — COMPREHENSIVE METABOLIC PANEL
ALT: 35 U/L (ref 14–54)
AST: 62 U/L — AB (ref 15–41)
Albumin: 3.5 g/dL (ref 3.5–5.0)
Alkaline Phosphatase: 101 U/L (ref 38–126)
Anion gap: 12 (ref 5–15)
BILIRUBIN TOTAL: 1.1 mg/dL (ref 0.3–1.2)
BUN: 12 mg/dL (ref 6–20)
CALCIUM: 10.4 mg/dL — AB (ref 8.9–10.3)
CO2: 20 mmol/L — ABNORMAL LOW (ref 22–32)
Chloride: 103 mmol/L (ref 101–111)
Creatinine, Ser: 0.91 mg/dL (ref 0.44–1.00)
GFR calc Af Amer: >60 mL/min (ref >60)
GFR calc non Af Amer: >60 mL/min (ref >60)
Glucose, Bld: 201 mg/dL — ABNORMAL HIGH (ref 65–99)
POTASSIUM: 4.5 mmol/L (ref 3.5–5.1)
Sodium: 135 mmol/L (ref 135–145)
TOTAL PROTEIN: 8.2 g/dL — AB (ref 6.5–8.1)

## 2017-09-23 LAB — CBC WITH DIFFERENTIAL/PLATELET
BASOS ABS: 0.1 10*3/uL (ref 0–0.1)
BASOS PCT: 1 %
EOS ABS: 0.2 10*3/uL (ref 0–0.7)
EOS PCT: 2 %
HCT: 29 % — ABNORMAL LOW (ref 35.0–47.0)
Hemoglobin: 9.7 g/dL — ABNORMAL LOW (ref 12.0–16.0)
LYMPHS PCT: 15 %
Lymphs Abs: 1.5 10*3/uL (ref 1.0–3.6)
MCH: 30.8 pg (ref 26.0–34.0)
MCHC: 33.5 g/dL (ref 32.0–36.0)
MCV: 91.9 fL (ref 80.0–100.0)
Monocytes Absolute: 0.8 10*3/uL (ref 0.2–0.9)
Monocytes Relative: 8 %
Neutro Abs: 7.5 10*3/uL — ABNORMAL HIGH (ref 1.4–6.5)
Neutrophils Relative %: 74 %
PLATELETS: 226 10*3/uL (ref 150–440)
RBC: 3.15 MIL/uL — AB (ref 3.80–5.20)
RDW: 17.5 % — ABNORMAL HIGH (ref 11.5–14.5)
WBC: 10 10*3/uL (ref 3.6–11.0)

## 2017-09-23 MED ORDER — ONDANSETRON HCL 4 MG/2ML IJ SOLN
4.0000 mg | Freq: Once | INTRAMUSCULAR | Status: AC
Start: 2017-09-23 — End: 2017-09-23
  Administered 2017-09-23: 4 mg via INTRAVENOUS
  Filled 2017-09-23: qty 2

## 2017-09-23 MED ORDER — OXYCODONE-ACETAMINOPHEN 5-325 MG PO TABS: 1.0000 | ORAL_TABLET | ORAL | 0 refills | Status: DC | PRN

## 2017-09-23 MED ORDER — MORPHINE SULFATE (PF) 4 MG/ML IV SOLN
6.0000 mg | Freq: Once | INTRAVENOUS | Status: AC
  Administered 2017-09-23: 4 mg via INTRAVENOUS
  Filled 2017-09-23: qty 2

## 2017-09-23 NOTE — Discharge Instructions (Signed)
Please follow-up with your primary care physician in 2 days for reevaluation.  Continue taking all of your medications at home as prescribed and return to the emergency department for any concerns.  It was a pleasure to take care of you today, and thank you for coming to our emergency department.  If you have any questions or concerns before leaving please ask the nurse to grab me and I'm more than happy to go through your aftercare instructions again.  If you were prescribed any opioid pain medication today such as Norco, Vicodin, Percocet, morphine, hydrocodone, or oxycodone please make sure you do not drive when you are taking this medication as it can alter your ability to drive safely.  If you have any concerns once you are home that you are not improving or are in fact getting worse before you can make it to your follow-up appointment, please do not hesitate to call [phone] and come back for further evaluation.  Darel Hong, MD  Results for orders placed or performed during the hospital encounter of 09/23/17  Comprehensive metabolic panel  Result Value Ref Range   Sodium 135 135 - 145 mmol/L   Potassium 4.5 3.5 - 5.1 mmol/L   Chloride 103 101 - 111 mmol/L   CO2 20 (L) 22 - 32 mmol/L   Glucose, Bld 201 (H) 65 - 99 mg/dL   BUN 12 6 - 20 mg/dL   Creatinine, Ser 0.91 0.44 - 1.00 mg/dL   Calcium 10.4 (H) 8.9 - 10.3 mg/dL   Total Protein 8.2 (H) 6.5 - 8.1 g/dL   Albumin 3.5 3.5 - 5.0 g/dL   AST 62 (H) 15 - 41 U/L   ALT 35 14 - 54 U/L   Alkaline Phosphatase 101 38 - 126 U/L   Total Bilirubin 1.1 0.3 - 1.2 mg/dL   GFR calc non Af Amer >60 >60 mL/min   GFR calc Af Amer >60 >60 mL/min   Anion gap 12 5 - 15  CBC with Differential  Result Value Ref Range   WBC 10.0 3.6 - 11.0 K/uL   RBC 3.15 (L) 3.80 - 5.20 MIL/uL   Hemoglobin 9.7 (L) 12.0 - 16.0 g/dL   HCT 29.0 (L) 35.0 - 47.0 %   MCV 91.9 80.0 - 100.0 fL   MCH 30.8 26.0 - 34.0 pg   MCHC 33.5 32.0 - 36.0 g/dL   RDW 17.5 (H) 11.5 -  14.5 %   Platelets 226 150 - 440 K/uL   Neutrophils Relative % 74 %   Neutro Abs 7.5 (H) 1.4 - 6.5 K/uL   Lymphocytes Relative 15 %   Lymphs Abs 1.5 1.0 - 3.6 K/uL   Monocytes Relative 8 %   Monocytes Absolute 0.8 0.2 - 0.9 K/uL   Eosinophils Relative 2 %   Eosinophils Absolute 0.2 0 - 0.7 K/uL   Basophils Relative 1 %   Basophils Absolute 0.1 0 - 0.1 K/uL   Dg Chest 2 View  Result Date: 09/07/2017 CLINICAL DATA:  Chest pain and pressure beginning a week ago. Occasional dizziness. EXAM: CHEST  2 VIEW COMPARISON:  05/07/2012 FINDINGS: The patient has taken a poor inspiration. Heart size is normal. Mediastinal shadows are normal. There may be bronchial thickening but there is no infiltrate, collapse or effusion. Bony structures are unremarkable. IMPRESSION: Poor inspiration. Question bronchitis. No edema, consolidation or collapse. Electronically Signed   By: Nelson Chimes M.D.   On: 09/07/2017 13:34   US Venous Img Lower Unilateral Right  Result Date: 09/23/2017 CLINICAL DATA:  Right lower extremity pain and swelling. EXAM: Right LOWER EXTREMITY VENOUS DOPPLER ULTRASOUND TECHNIQUE: Gray-scale sonography with graded compression, as well as color Doppler and duplex ultrasound were performed to evaluate the lower extremity deep venous systems from the level of the common femoral vein and including the common femoral, femoral, profunda femoral, popliteal and calf veins including the posterior tibial, peroneal and gastrocnemius veins when visible. The superficial great saphenous vein was also interrogated. Spectral Doppler was utilized to evaluate flow at rest and with distal augmentation maneuvers in the common femoral, femoral and popliteal veins. COMPARISON:  Ultrasound of September 17, 2017. FINDINGS: Contralateral Common Femoral Vein: Respiratory phasicity is normal and symmetric with the symptomatic side. No evidence of thrombus. Normal compressibility. Common Femoral Vein: Noncompressible with no  flow consistent with occlusive thrombus. Saphenofemoral Junction: Noncompressible with no flow consistent with occlusive thrombus. Profunda Femoral Vein: Noncompressible with no flow consistent with occlusive thrombus. Femoral Vein: Noncompressible with no flow consistent with occlusive thrombus. Popliteal Vein: Noncompressible with no flow consistent with occlusive thrombus. Calf Veins: Posterior tibial and peroneal veins are noncompressible with no flow consistent with occlusive thrombus. Venous Reflux:  None. Other Findings:  None. IMPRESSION: There is again noted occlusive deep venous thrombosis of the right common femoral, superficial femoral, profunda femoral, popliteal, posterior tibial and peroneal veins. Electronically Signed   By: Marijo Conception, M.D.   On: 09/23/2017 02:37   US Venous Img Lower Unilateral Right  Result Date: 09/17/2017 CLINICAL DATA:  Increased swelling and pain in RIGHT lower extremity, known DVT, prior IVC filter placement EXAM: RIGHT LOWER EXTREMITY VENOUS DOPPLER ULTRASOUND TECHNIQUE: Gray-scale sonography with graded compression, as well as color Doppler and duplex ultrasound were performed to evaluate the lower extremity deep venous systems from the level of the common femoral vein and including the common femoral, femoral, profunda femoral, popliteal and calf veins including the posterior tibial, peroneal and gastrocnemius veins when visible. The superficial great saphenous vein was also interrogated. Spectral Doppler was utilized to evaluate flow at rest and with distal augmentation maneuvers in the common femoral, femoral and popliteal veins. COMPARISON:  08/29/2017 FINDINGS: Contralateral Common Femoral Vein: Respiratory phasicity is normal and symmetric with the symptomatic side. No evidence of thrombus. Normal compressibility. Common Femoral Vein: Acute appearing hypoechoic nonocclusive thrombus new since prior exam. Impaired compressibility and spontaneous venous flow.  Saphenofemoral Junction: Nonocclusive thrombus, new since prior exam Profunda Femoral Vein: No evidence of thrombus. Normal compressibility. Femoral Vein: Occlusive thrombus with impaired compressibility, new since prior exam Popliteal Vein: Occlusive thrombus with impaired compressibility Calf Veins: Occlusive thrombus with impaired compressibility Superficial Great Saphenous Vein: No evidence of thrombus. Normal compressibility. Venous Reflux:  None. Other Findings:  None. IMPRESSION: Propagation of deep venous thrombosis of the RIGHT lower extremity now involving the RIGHT common femoral and femoral veins in addition to previously identified RIGHT popliteal and calf veins. Electronically Signed   By: Lavonia Dana M.D.   On: 09/17/2017 17:57   US Venous Img Lower Unilateral Right  Result Date: 08/29/2017 CLINICAL DATA:  Pain, swelling, redness x3 days EXAM: RIGHT LOWER EXTREMITY VENOUS DOPPLER ULTRASOUND TECHNIQUE: Gray-scale sonography with compression, as well as color and duplex ultrasound, were performed to evaluate the deep venous system from the level of the common femoral vein through the popliteal and proximal calf veins. COMPARISON:  None FINDINGS: Occlusive thrombus in posterior tibial and peroneal veins extending through the popliteal vein into the distal femoral vein. The mid  and central aspect of the femoral vein remain patent. Unremarkable common femoral vein and visualized portion of the deep femoral vein. IMPRESSION: 1. Occlusive right calf and popliteal DVT extending into the distal femoral vein. Electronically Signed   By: Lucrezia Europe M.D.   On: 08/29/2017 13:52

## 2017-09-23 NOTE — ED Provider Notes (Signed)
[facility] Emergency Department Provider Note  ____________________________________________   First MD Initiated Contact with Patient 09/23/17 [phone]     (approximate)  I have reviewed the triage vital signs and the nursing notes.   HISTORY  Chief Complaint Leg Swelling and Leg Pain   HPI Ann Larson is a 62 y.o. female who comes to the emergency department with severe right leg pain that is preventing her from sleep.  The pain has been chronic for the past several weeks although is now slowly progressive and worsening.  She has a complex past medical history including a known deep vein thrombosis in her right lower extremity.  She was previously anticoagulated and then suffered a GI bleed requiring transfusion.  She had an IVC filter placed and her GI bleed is currently being treated.  She most recently had an endoscopy yesterday with the results below.  Pain in her leg is severe throbbing aching.  Seems to be worse with movement and somewhat improved with rest.  09/21/17 Endoscopy: - Normal esophagus. - Gastritis with hemorrhage. Treated with argon plasma coagulation (APC). - No specimens collected.   Past Medical History:  Diagnosis Date  . Depression   . Diabetes mellitus without complication ([place])   . Diverticulitis   . DVT (deep vein thrombosis) in pregnancy ([place]) 08/29/2017   Right popliteal and femoral  . GERD (gastroesophageal reflux disease)   . Hyperlipidemia   . Hypertension   . Hypothyroidism   . Obesity     Patient Active Problem List   Diagnosis Date Noted  . Personal history of peptic ulcer disease   . Gastritis with hemorrhage   . Symptomatic anemia   . Heme + stool   . Blood in stool   . GI bleed 09/07/2017    Past Surgical History:  Procedure Laterality Date  . ABDOMINAL HYSTERECTOMY  2005  . CHOLECYSTECTOMY  2015  . COLONOSCOPY WITH PROPOFOL N/A 07/01/2015   Procedure: COLONOSCOPY WITH PROPOFOL;  Surgeon:  Josefine Class, MD;  Location: [facility] ENDOSCOPY;  Service: Endoscopy;  Laterality: N/A;  . ESOPHAGOGASTRODUODENOSCOPY (EGD) WITH PROPOFOL N/A 07/01/2015   Procedure: ESOPHAGOGASTRODUODENOSCOPY (EGD) WITH PROPOFOL;  Surgeon: Josefine Class, MD;  Location: [facility] ENDOSCOPY;  Service: Endoscopy;  Laterality: N/A;  . ESOPHAGOGASTRODUODENOSCOPY (EGD) WITH PROPOFOL N/A 09/08/2017   Procedure: ESOPHAGOGASTRODUODENOSCOPY (EGD) WITH PROPOFOL;  Surgeon: Lucilla Lame, MD;  Location: [facility] ENDOSCOPY;  Service: Endoscopy;  Laterality: N/A;  . ESOPHAGOGASTRODUODENOSCOPY (EGD) WITH PROPOFOL N/A 09/21/2017   Procedure: ESOPHAGOGASTRODUODENOSCOPY (EGD) WITH PROPOFOL;  Surgeon: Lucilla Lame, MD;  Location: [facility] ENDOSCOPY;  Service: Endoscopy;  Laterality: N/A;  . IVC FILTER INSERTION N/A 09/08/2017   Procedure: IVC FILTER INSERTION;  Surgeon: Algernon Huxley, MD;  Location: [place] CV LAB;  Service: Cardiovascular;  Laterality: N/A;    Prior to Admission medications   Medication Sig Start Date End Date Taking? Authorizing Provider  ferrous sulfate 325 (65 FE) MG EC tablet Take 1 tablet (325 mg total) by mouth 2 (two) times daily. 09/09/17 11/08/17  Hillary Bow, MD  levothyroxine (SYNTHROID, LEVOTHROID) 125 MCG tablet Take 125 mcg by mouth daily before breakfast.    [provider]  lisinopril (PRINIVIL,ZESTRIL) 20 MG tablet Take 20 mg by mouth daily. 07/04/17   [provider]  metoprolol succinate (TOPROL-XL) 100 MG 24 hr tablet Take 50 mg by mouth daily. Take with or immediately following a meal.     [provider]  omeprazole (PRILOSEC) 40 MG capsule Take  1 capsule (40 mg total) by mouth 2 (two) times daily before a meal. 09/09/17   Hillary Bow, MD  oxyCODONE-acetaminophen (PERCOCET/ROXICET) 5-325 MG tablet Take 1 tablet by mouth every 4 (four) hours as needed for severe pain. 09/23/17   Darel Hong, MD  pioglitazone (ACTOS) 45 MG tablet Take 45 mg by mouth daily. 06/18/17    [provider]  simvastatin (ZOCOR) 20 MG tablet Take 20 mg by mouth daily.    [provider]  TOUJEO SOLOSTAR 300 UNIT/ML SOPN Inject 30 Units into the skin daily.  09/05/17   [provider]  venlafaxine XR (EFFEXOR-XR) 75 MG 24 hr capsule Take 75 mg by mouth daily with breakfast.    [provider]    Allergies Sulfa antibiotics  Family History  Problem Relation Age of Onset  . Deep vein thrombosis Neg Hx     Social History Social History   Tobacco Use  . Smoking status: Never Smoker  . Smokeless tobacco: Never Used  Substance Use Topics  . Alcohol use: No  . Drug use: No    Review of Systems Constitutional: No fever/chills Eyes: No visual changes. ENT: No sore throat. Cardiovascular: Denies chest pain. Respiratory: Denies shortness of breath. Gastrointestinal: No abdominal pain.  No nausea, no vomiting.  No diarrhea.  No constipation. Genitourinary: Negative for dysuria. Musculoskeletal: Negative for back pain. Skin: Negative for rash. Neurological: Negative for headaches, focal weakness or numbness.   ____________________________________________   PHYSICAL EXAM:  VITAL SIGNS: ED Triage Vitals  Enc Vitals Group     BP 09/23/17 0100 (!) 142/65     Pulse Rate 09/23/17 0100 99     Resp 09/23/17 0100 20     Temp 09/23/17 0100 98.5 F (36.9 C)     Temp Source 09/23/17 0100 Oral     SpO2 09/23/17 0100 100 %     Weight 09/23/17 0059 215 lb (97.5 kg)     Height 09/23/17 0059 5\' 9"  (1.753 m)     Head Circumference --      Peak Flow --      Pain Score --      Pain Loc --      Pain Edu? --      Excl. in [place]? --     Constitutional: Alert and oriented x4 appears extremely uncomfortable nontoxic no diaphoresis speaks in full clear sentences Eyes: PERRL EOMI. Head: Atraumatic. Nose: No congestion/rhinnorhea. Mouth/Throat: No trismus Neck: No stridor.   Cardiovascular: Normal rate, regular rhythm. Grossly normal heart  sounds.  Good peripheral circulation. Respiratory: Normal respiratory effort.  No retractions. Lungs CTAB and moving good air Gastrointestinal: Obese soft nontender Musculoskeletal: Right lower extremity is swollen.  Less than 2-second capillary refill.  2+ dorsalis pedis pulse.  Neurologically intact Neurologic:  Normal speech and language. No gross focal neurologic deficits are appreciated. Skin:  Skin is warm, dry and intact. No rash noted. Psychiatric: Mood and affect are normal. Speech and behavior are normal.    ____________________________________________   DIFFERENTIAL includes but not limited to  Deep vein thrombosis, phlegmasia cerulea dolens, phlegmasia cerulea albicans, claudication ____________________________________________   LABS (all labs ordered are listed, but only abnormal results are displayed)  Labs Reviewed  COMPREHENSIVE METABOLIC PANEL - Abnormal; Notable for the following components:      Result Value   CO2 20 (*)    Glucose, Bld 201 (*)    Calcium 10.4 (*)    Total Protein 8.2 (*)    AST  62 (*)    All other components within normal limits  CBC WITH DIFFERENTIAL/PLATELET - Abnormal; Notable for the following components:   RBC 3.15 (*)    Hemoglobin 9.7 (*)    HCT 29.0 (*)    RDW 17.5 (*)    Neutro Abs 7.5 (*)    All other components within normal limits    Lab work reviewed by me with stable hemoglobin __________________________________________  EKG   ____________________________________________  RADIOLOGY  Right lower extremity ultrasound reviewed by me shows progression of deep vein thrombosis ____________________________________________   PROCEDURES  Procedure(s) performed: no  Procedures  Critical Care performed: no  Observation: no ____________________________________________   INITIAL IMPRESSION / ASSESSMENT AND PLAN / ED COURSE  Pertinent labs & imaging results that were available during my care of the patient were  reviewed by me and considered in my medical decision making (see chart for details).  By the time I saw the patient she had a right lower extremity ultrasound consistent with worsening deep vein thrombosis.  She has no signs of arterial occlusion.  Less than 2-second capillary refill.  I will reach out to vascular surgery now to discuss possible thrombectomy.     ----------------------------------------- 4:06 AM on 09/23/2017 -----------------------------------------  Spoke with on-call vascular surgeon Dr. Delana Meyer who indicated that thrombectomy is impossible without anticoagulation and given the patient's current active GI bleed she would not be a candidate.  The patient's pain is improved.  I had a lengthy discussion with the patient regarding the difficulty of treating her deep vein thrombosis while she is continuing to bleed.  I will prescribe her a course of opioid pain medication to get her through the acute setting however hopefully her GI bleed stops soon and we can anticoagulate her.  Strict return precautions have been given and the patient verbalizes understanding and agreement with the plan. ____________________________________________   FINAL CLINICAL IMPRESSION(S) / ED DIAGNOSES  Final diagnoses:  Chronic deep vein thrombosis (DVT) of femoral vein of right lower extremity (HCC)      NEW MEDICATIONS STARTED DURING THIS VISIT:  Discharge Medication List as of 09/23/2017  4:16 AM    START taking these medications   Details  oxyCODONE-acetaminophen (PERCOCET/ROXICET) 5-325 MG tablet Take 1 tablet by mouth every 4 (four) hours as needed for severe pain., Starting Sun 09/23/2017, Print         Note:  This document was prepared using Dragon voice recognition software and may include unintentional dictation errors.     Darel Hong, MD 09/24/17 1001

## 2017-09-23 NOTE — ED Triage Notes (Signed)
Patient reports diagnosed with blood clot in right leg in January.  Reports increased swelling and pain over the past 3-4 days.

## 2017-09-24 ENCOUNTER — Encounter: Payer: Self-pay | Admitting: Gastroenterology

## 2017-09-25 LAB — SURGICAL PATHOLOGY

## 2017-09-26 ENCOUNTER — Encounter (INDEPENDENT_AMBULATORY_CARE_PROVIDER_SITE_OTHER): Payer: Self-pay | Admitting: Vascular Surgery

## 2017-09-26 ENCOUNTER — Ambulatory Visit (INDEPENDENT_AMBULATORY_CARE_PROVIDER_SITE_OTHER): Payer: BLUE CROSS/BLUE SHIELD | Admitting: Vascular Surgery

## 2017-09-26 ENCOUNTER — Encounter: Payer: Self-pay | Admitting: Gastroenterology

## 2017-09-26 VITALS — BP 120/69 | HR 107 | Resp 17 | Ht 69.0 in | Wt 228.0 lb

## 2017-09-26 DIAGNOSIS — D649 Anemia, unspecified: Secondary | ICD-10-CM

## 2017-09-26 DIAGNOSIS — I82401 Acute embolism and thrombosis of unspecified deep veins of right lower extremity: Secondary | ICD-10-CM | POA: Insufficient documentation

## 2017-09-26 DIAGNOSIS — K922 Gastrointestinal hemorrhage, unspecified: Secondary | ICD-10-CM

## 2017-09-26 DIAGNOSIS — I82491 Acute embolism and thrombosis of other specified deep vein of right lower extremity: Secondary | ICD-10-CM | POA: Diagnosis not present

## 2017-09-26 NOTE — Progress Notes (Signed)
Subjective:    Patient ID: Ann Larson, female    DOB: 06-24-1956, 62 y.o.   MRN: [id number] Chief Complaint  Patient presents with  . New Patient (Initial Visit)    DVT, possible unna boot   The patient presents to our office for the first time for evaluation of worsening right lower extremity DVT.  The patient was originally diagnosed on August 29, 2017 by her primary care physician.  After the cyst was made the patient was placed on Xarelto however this was stopped around September 07, 2017 for a GI bleed.  During this time, she was seen as a consult at [facility].  Since anticoagulation was contraindicated and IVC filter was placed without complication on September 08, 2017.  The patient was then seen again at [facility] ED on 09/17/17 and September 23, 2017 for evaluation of right lower extremity swelling and discomfort.  The patient has been prescribed oxycodone which she states provides minimal pain relief.  The patient has been wearing compression socks to the right lower extremity and elevating her legs heart level or higher with minimal improvement to her discomfort.  The patient is upset that she is not a candidate for venous lysis.  The patient is also stating she would like a second opinion.  The patient denies any shortness of breath or chest pain.  The patient denies any color change to the right lower extremity.  The patient denies any fever, nausea or vomiting.  On September 23, 2017, the most recent ultrasound was notable for an occlusive deep venous thrombosis of the right common femoral, superficial femoral, profunda femoral, popliteal, posterior tibial and peroneal veins.   Review of Systems  Constitutional: Negative.   HENT: Negative.   Eyes: Negative.   Respiratory: Negative.   Cardiovascular: Positive for leg swelling.       Right lower extremity pain  Gastrointestinal: Negative.   Endocrine: Negative.   Genitourinary: Negative.    Musculoskeletal: Negative.   Skin: Negative.   Allergic/Immunologic: Negative.   Neurological: Negative.   Hematological: Negative.   Psychiatric/Behavioral: Negative.       Objective:   Physical Exam  Constitutional: She is oriented to person, place, and time. She appears well-developed and well-nourished. No distress.  HENT:  Head: Normocephalic and atraumatic.  Eyes: Conjunctivae are normal. Pupils are equal, round, and reactive to light.  Neck: Normal range of motion.  Cardiovascular: Normal rate, regular rhythm, normal heart sounds and intact distal pulses.  Pulses:      Radial pulses are 2+ on the right side, and 2+ on the left side.       Dorsalis pedis pulses are 1+ on the right side, and 2+ on the left side.       Posterior tibial pulses are 1+ on the right side, and 2+ on the left side.  There is no acute vascular compromise to the right lower extremity.  There is moderate nonpitting edema noted to the right lower extremity.  There is no cellulitis.  There is no color change.  Skin is intact.  Calf is soft.  Thigh is soft.  Pulmonary/Chest: Effort normal and breath sounds normal.  Neurological: She is alert and oriented to person, place, and time.  Skin: Skin is warm. She is not diaphoretic.  Psychiatric: She has a normal mood and affect. Her behavior is normal. Judgment and thought content normal.  Vitals reviewed.  BP 120/69 (BP Location: Right Arm, Patient Position: Sitting)  Pulse (!) 107   Resp 17   Ht 5\' 9"  (1.753 m)   Wt 228 lb (103.4 kg)   BMI 33.67 kg/m   Past Medical History:  Diagnosis Date  . Depression   . Diabetes mellitus without complication ([place])   . Diverticulitis   . DVT (deep vein thrombosis) in pregnancy ([place]) 08/29/2017   Right popliteal and femoral  . GERD (gastroesophageal reflux disease)   . Hyperlipidemia   . Hypertension   . Hypothyroidism   . Obesity    Social History   Socioeconomic History  . Marital status: Married     Spouse name: Not on file  . Number of children: Not on file  . Years of education: Not on file  . Highest education level: Not on file  Social Needs  . Financial resource strain: Not on file  . Food insecurity - worry: Not on file  . Food insecurity - inability: Not on file  . Transportation needs - medical: Not on file  . Transportation needs - non-medical: Not on file  Occupational History  . Not on file  Tobacco Use  . Smoking status: Never Smoker  . Smokeless tobacco: Never Used  Substance and Sexual Activity  . Alcohol use: No  . Drug use: No  . Sexual activity: Not on file  Other Topics Concern  . Not on file  Social History Narrative  . Not on file   Past Surgical History:  Procedure Laterality Date  . ABDOMINAL HYSTERECTOMY  2005  . CHOLECYSTECTOMY  2015  . COLONOSCOPY WITH PROPOFOL N/A 07/01/2015   Procedure: COLONOSCOPY WITH PROPOFOL;  Surgeon: Josefine Class, MD;  Location: [facility] ENDOSCOPY;  Service: Endoscopy;  Laterality: N/A;  . ESOPHAGOGASTRODUODENOSCOPY (EGD) WITH PROPOFOL N/A 07/01/2015   Procedure: ESOPHAGOGASTRODUODENOSCOPY (EGD) WITH PROPOFOL;  Surgeon: Josefine Class, MD;  Location: [facility] ENDOSCOPY;  Service: Endoscopy;  Laterality: N/A;  . ESOPHAGOGASTRODUODENOSCOPY (EGD) WITH PROPOFOL N/A 09/08/2017   Procedure: ESOPHAGOGASTRODUODENOSCOPY (EGD) WITH PROPOFOL;  Surgeon: Lucilla Lame, MD;  Location: [facility] ENDOSCOPY;  Service: Endoscopy;  Laterality: N/A;  . ESOPHAGOGASTRODUODENOSCOPY (EGD) WITH PROPOFOL N/A 09/21/2017   Procedure: ESOPHAGOGASTRODUODENOSCOPY (EGD) WITH PROPOFOL;  Surgeon: Lucilla Lame, MD;  Location: [facility] ENDOSCOPY;  Service: Endoscopy;  Laterality: N/A;  . IVC FILTER INSERTION N/A 09/08/2017   Procedure: IVC FILTER INSERTION;  Surgeon: Algernon Huxley, MD;  Location: [place] CV LAB;  Service: Cardiovascular;  Laterality: N/A;   Family History  Problem Relation Age of Onset  . Deep vein thrombosis Neg Hx    Allergies  Allergen  Reactions  . Sulfa Antibiotics       Assessment & Plan:  The patient presents to our office for the first time for evaluation of worsening right lower extremity DVT.  The patient was originally diagnosed on August 29, 2017 by her primary care physician.  After the cyst was made the patient was placed on Xarelto however this was stopped around September 07, 2017 for a GI bleed.  During this time, she was seen as a consult at [facility].  Since anticoagulation was contraindicated and IVC filter was placed without complication on September 08, 2017.  The patient was then seen again at [facility] ED on 09/17/17 and September 23, 2017 for evaluation of right lower extremity swelling and discomfort.  The patient has been prescribed oxycodone which she states provides minimal pain relief.  The patient has been wearing compression socks to the right lower extremity and elevating  her legs heart level or higher with minimal improvement to her discomfort.  The patient is upset that she is not a candidate for venous lysis.  The patient is also stating she would like a second opinion.  The patient denies any shortness of breath or chest pain.  The patient denies any color change to the right lower extremity.  The patient denies any fever, nausea or vomiting.  On September 23, 2017, the most recent ultrasound was notable for an occlusive deep venous thrombosis of the right common femoral, superficial femoral, profunda femoral, popliteal, posterior tibial and peroneal veins.  1. Deep vein thrombosis (DVT) of other vein of right lower extremity, unspecified chronicity ([place]) - Stable I had a long conversation with the patient with her husband present We discussed her anatomy what an occlusive DVT means and exactly where in the right lower extremity it was located We discussed her IVC filter wire was put in place and how it is protecting her from pulmonary embolism We discussed her  contraindication to blood thinners and subsequent worsening of her DVT due to this We also discussed her not being a candidate for venous lysis due to the contraindication to blood thinners.  If we were to move forward with a venous lysis without the TPA component the area being treated would soon thrombose again. We discussed that it is the patient's right she can seek a second opinion if that is what she feels is appropriate.  The patient should continue wearing medical grade 1 compression stockings, elevating her legs and remaining active. Patient is to continue taking the oxycodone she was prescribed for pain control The patient is to seek medical attention at an emergency department if she should experience any shortness of breath or chest pain We will have the patient come back in approximately 1-2 weeks to undergo a right lower extremity DVT study The patient is upset that she has not seen the physician so I will place her on the physician schedule for her follow-up  - VAS [place] LOWER EXTREMITY VENOUS (DVT); Future  2. Gastrointestinal hemorrhage, unspecified gastrointestinal hemorrhage type - Stable Patient experienced GI bleed after being started on Xarelto for right lower extremity DVT Oral anticoagulation is now contraindicated for treatment of her DVT The patient has an IVC filter in place and will need to control her symptoms through compression, elevation and activity  3. Symptomatic anemia - Stable Patient presents asymptomatically today. This is followed by her primary care physician  Current Outpatient Medications on File Prior to Visit  Medication Sig Dispense Refill  . benzonatate (TESSALON) 200 MG capsule TAKE 1 CAPSULE BY MOUTH THREE TIMES A DAY AS NEEDED  1  . ferrous sulfate 325 (65 FE) MG EC tablet Take 1 tablet (325 mg total) by mouth 2 (two) times daily. 60 tablet 1  . levothyroxine (SYNTHROID, LEVOTHROID) 125 MCG tablet Take 125 mcg by mouth daily before breakfast.      . lisinopril (PRINIVIL,ZESTRIL) 20 MG tablet Take 20 mg by mouth daily.  1  . metoprolol succinate (TOPROL-XL) 100 MG 24 hr tablet Take 50 mg by mouth daily. Take with or immediately following a meal.     . omeprazole (PRILOSEC) 40 MG capsule Take 1 capsule (40 mg total) by mouth 2 (two) times daily before a meal. 60 capsule 0  . oxyCODONE-acetaminophen (PERCOCET/ROXICET) 5-325 MG tablet Take 1 tablet by mouth every 4 (four) hours as needed for severe pain. 29 tablet 0  . pioglitazone (ACTOS) 45  MG tablet Take 45 mg by mouth daily.  0  . simvastatin (ZOCOR) 20 MG tablet Take 20 mg by mouth daily.    Nelva Nay SOLOSTAR 300 UNIT/ML SOPN Inject 30 Units into the skin daily.   4  . venlafaxine XR (EFFEXOR-XR) 75 MG 24 hr capsule Take 75 mg by mouth daily with breakfast.     No current facility-administered medications on file prior to visit.    There are no Patient Instructions on file for this visit. No Follow-up on file.  Ryleigh Esqueda A Saxon Barich, PA-C

## 2017-10-01 NOTE — Anesthesia Postprocedure Evaluation (Signed)
Anesthesia Post Note  Patient: Ann Larson  Procedure(s) Performed: ESOPHAGOGASTRODUODENOSCOPY (EGD) WITH PROPOFOL (N/A )  Patient location during evaluation: PACU Anesthesia Type: General Level of consciousness: awake and alert Pain management: pain level controlled Vital Signs Assessment: post-procedure vital signs reviewed and stable Respiratory status: spontaneous breathing, nonlabored ventilation, respiratory function stable and patient connected to nasal cannula oxygen Cardiovascular status: blood pressure returned to baseline and stable Postop Assessment: no apparent nausea or vomiting Anesthetic complications: no     Last Vitals:  Vitals:   09/21/17 1535 09/21/17 1545  BP: 138/75 139/81  Pulse: 98   Resp: (!) 27   Temp:    SpO2: 100%     Last Pain:  Vitals:   09/21/17 1555  TempSrc:   PainSc: 5                  Molli Barrows

## 2017-10-02 ENCOUNTER — Telehealth (INDEPENDENT_AMBULATORY_CARE_PROVIDER_SITE_OTHER): Payer: Self-pay

## 2017-10-02 NOTE — Telephone Encounter (Signed)
Patient called wanting more oxycodone pain medication because she stated she was in severe pain. She stated she had called the ED and was told she needed to be access again before any pain medication could be given. Patient was very irate and started crying saying' it's just like she doesn't have a doctor'. I advised she could take Tylenol she said 'that will not do anything for her'. She then hung up the phone. The patient has an appt on Friday for an u/s and follow up. She was given this information.

## 2017-10-05 ENCOUNTER — Ambulatory Visit (INDEPENDENT_AMBULATORY_CARE_PROVIDER_SITE_OTHER): Payer: BLUE CROSS/BLUE SHIELD | Admitting: Vascular Surgery

## 2017-10-05 ENCOUNTER — Encounter (INDEPENDENT_AMBULATORY_CARE_PROVIDER_SITE_OTHER): Payer: Self-pay | Admitting: Vascular Surgery

## 2017-10-05 ENCOUNTER — Ambulatory Visit (INDEPENDENT_AMBULATORY_CARE_PROVIDER_SITE_OTHER): Payer: BLUE CROSS/BLUE SHIELD

## 2017-10-05 VITALS — BP 130/82 | HR 94 | Resp 16 | Wt 226.2 lb

## 2017-10-05 DIAGNOSIS — I82491 Acute embolism and thrombosis of other specified deep vein of right lower extremity: Secondary | ICD-10-CM | POA: Diagnosis not present

## 2017-10-05 DIAGNOSIS — K922 Gastrointestinal hemorrhage, unspecified: Secondary | ICD-10-CM | POA: Diagnosis not present

## 2017-10-05 DIAGNOSIS — I82411 Acute embolism and thrombosis of right femoral vein: Secondary | ICD-10-CM

## 2017-10-05 DIAGNOSIS — I10 Essential (primary) hypertension: Secondary | ICD-10-CM | POA: Diagnosis not present

## 2017-10-05 NOTE — Patient Instructions (Signed)
Deep Vein Thrombosis Deep vein thrombosis (DVT) is a condition in which a blood clot forms in a deep vein, such as a lower leg, thigh, or arm vein. A clot is blood that has thickened into a gel or solid. This condition is dangerous. It can lead to serious and even life-threatening complications if the clot travels to the lungs and causes a blockage (pulmonary embolism). It can also damage veins in the leg. This can result in leg pain, swelling, discoloration, and sores (post-thrombotic syndrome). What are the causes? This condition may be caused by:  A slowdown of blood flow.  Damage to a vein.  A condition that makes blood clot more easily.  What increases the risk? The following factors may make you more likely to develop this condition:  Being overweight.  Being elderly, especially over age 60.  Sitting or lying down for more than four hours.  Lack of physical activity (sedentary lifestyle).  Being pregnant, giving birth, or having recently given birth.  Taking medicines that contain estrogen.  Smoking.  A history of any of the following: ? Blood clots or blood clotting disease. ? Peripheral vascular disease. ? Inflammatory bowel disease. ? Cancer. ? Heart disease. ? Genetic conditions that affect how blood clots. ? Neurological diseases that affect the legs (leg paresis). ? Injury. ? Major or lengthy surgery. ? A central line placed inside a large vein.  What are the signs or symptoms? Symptoms of this condition include:  Swelling, pain, or tenderness in an arm or leg.  Warmth, redness, or discoloration in an arm or leg.  If the clot is in your leg, symptoms may be more noticeable or worse when you stand or walk. Some people do not have any symptoms. How is this diagnosed? This condition is diagnosed with:  A medical history.  A physical exam.  Tests, such as: ? Blood tests. These are done to see how your blood clots. ? Imaging tests. These are done to  check for clots. Tests may include:  Ultrasound.  CT scan.  MRI.  X-ray.  Venogram. For this test, X-rays are taken after a dye is injected into a vein.  How is this treated? Treatment for this condition depends on the cause, your risk for bleeding or developing more clots, and any medical conditions you have. Treatment may include:  Taking blood thinners (also called anticoagulants). These medicines may be taken by mouth, injected under the skin, or injected through an IV tube (catheter). These medicines prevent clots from forming.  Injecting medicine that dissolves blood clots into the affected vein (catheter-directed thrombolysis).  Having surgery. Surgery may be done to: ? Remove the clot. ? Place a filter in a large vein to catch blood clots before they reach the lungs.  Some treatments may be continued for up to six months. Follow these instructions at home: If you are taking an oral blood thinner:  Take the medicine exactly as told by your health care provider. Some blood thinners need to be taken at the same time every day. Do not skip a dose.  Ask your health care provider about what foods and drugs interact with the medicine.  Ask about possible side effects. General instructions  Blood thinners can cause easy bruising and difficulty stopping bleeding. Because of this, if you are taking or were given a blood thinner: ? Hold pressure over cuts for longer than usual. ? Tell your dentist and other health care providers that you are taking blood thinners before   having any procedures that can cause bleeding. ? Avoid contact sports.  Take over-the-counter and prescription medicines only as told by your health care provider.  Return to your normal activities as told by your health care provider. Ask your health care provider what activities are safe for you.  Wear compression stockings if recommended by your health care provider.  Keep all follow-up visits as told by  your health care provider. This is important. How is this prevented? To lower your risk of developing this condition again:  For 30 or more minutes every day, do an activity that: ? Involves moving your arms and legs. ? Increases your heart rate.  When traveling for longer than four hours: ? Exercise your arms and legs every hour. ? Drink plenty of water. ? Avoid drinking alcohol.  Avoid sitting or lying for a long time without moving your legs.  Stay a healthy weight.  If you are a woman who is older than age 35, avoid unnecessary use of medicines that contain estrogen.  Do not use any products that contain nicotine or tobacco, such as cigarettes and e-cigarettes. This is especially important if you take estrogen medicines. If you need help quitting, ask your health care provider.  Contact a health care provider if:  You miss a dose of your blood thinner.  You have nausea, vomiting, or diarrhea that lasts for more than one day.  Your menstrual period is heavier than usual.  You have unusual bruising. Get help right away if:  You have new or increased pain, swelling, or redness in an arm or leg.  You have numbness or tingling in an arm or leg.  You have shortness of breath.  You have chest pain.  You have a rapid or irregular heartbeat.  You feel light-headed or dizzy.  You cough up blood.  There is blood in your vomit, stool, or urine.  You have a serious fall or accident, or you hit your head.  You have a severe headache or confusion.  You have a cut that will not stop bleeding. These symptoms may represent a serious problem that is an emergency. Do not wait to see if the symptoms will go away. Get medical help right away. Call your local emergency services (911 in the [place] Do not drive yourself to the hospital. Summary  DVT is a condition in which a blood clot forms in a deep vein, such as a lower leg, thigh, or arm vein.  Symptoms can include swelling,  warmth, pain, and redness in your leg or arm.  Treatment may include taking blood thinners, injecting medicine that dissolves blood clots,wearing compression stockings, or surgery.  If you are prescribed blood thinners, take them exactly as told. This information is not intended to replace advice given to you by your health care provider. Make sure you discuss any questions you have with your health care provider. Document Released: 08/07/2005 Document Revised: 09/09/2016 Document Reviewed: 09/09/2016 [employer] Patient Education  2018 [employer]  

## 2017-10-05 NOTE — Progress Notes (Signed)
MRN : [id number]  Ann Larson is a 62 y.o. (1956/05/13) female who presents with chief complaint of  Chief Complaint  Patient presents with  . Follow-up    rle dvt ultrasound   .  History of Present Illness: Patient returns today in follow up.  Her leg is actually significantly better today in terms of the swelling and she says the pain is also improved.  This is largely result of elevation and wearing her compression stocking.  She has an extensive right lower extremity DVT with a contraindication to anticoagulation.  She had a filter placed for her GI bleed with anemia.  Duplex today shows more chronic appearing right lower extremity thrombus with partial recanalization throughout the venous segments of the right lower extremity.  Current Outpatient Medications  Medication Sig Dispense Refill  . benzonatate (TESSALON) 200 MG capsule TAKE 1 CAPSULE BY MOUTH THREE TIMES A DAY AS NEEDED  1  . ferrous sulfate 325 (65 FE) MG EC tablet Take 1 tablet (325 mg total) by mouth 2 (two) times daily. 60 tablet 1  . levothyroxine (SYNTHROID, LEVOTHROID) 125 MCG tablet Take 125 mcg by mouth daily before breakfast.    . lisinopril (PRINIVIL,ZESTRIL) 20 MG tablet Take 20 mg by mouth daily.  1  . metoprolol succinate (TOPROL-XL) 100 MG 24 hr tablet Take 50 mg by mouth daily. Take with or immediately following a meal.     . omeprazole (PRILOSEC) 40 MG capsule Take 1 capsule (40 mg total) by mouth 2 (two) times daily before a meal. 60 capsule 0  . oxyCODONE-acetaminophen (PERCOCET/ROXICET) 5-325 MG tablet Take 1 tablet by mouth every 4 (four) hours as needed for severe pain. 29 tablet 0  . pioglitazone (ACTOS) 45 MG tablet Take 45 mg by mouth daily.  0  . simvastatin (ZOCOR) 20 MG tablet Take 20 mg by mouth daily.    Nelva Nay SOLOSTAR 300 UNIT/ML SOPN Inject 30 Units into the skin daily.   4  . venlafaxine XR (EFFEXOR-XR) 75 MG 24 hr capsule Take 75 mg by mouth daily with breakfast.     No current  facility-administered medications for this visit.     Past Medical History:  Diagnosis Date  . Depression   . Diabetes mellitus without complication ([place])   . Diverticulitis   . DVT (deep vein thrombosis) in pregnancy ([place]) 08/29/2017   Right popliteal and femoral  . GERD (gastroesophageal reflux disease)   . Hyperlipidemia   . Hypertension   . Hypothyroidism   . Obesity     Past Surgical History:  Procedure Laterality Date  . ABDOMINAL HYSTERECTOMY  2005  . CHOLECYSTECTOMY  2015  . COLONOSCOPY WITH PROPOFOL N/A 07/01/2015   Procedure: COLONOSCOPY WITH PROPOFOL;  Surgeon: Josefine Class, MD;  Location: [facility] ENDOSCOPY;  Service: Endoscopy;  Laterality: N/A;  . ESOPHAGOGASTRODUODENOSCOPY (EGD) WITH PROPOFOL N/A 07/01/2015   Procedure: ESOPHAGOGASTRODUODENOSCOPY (EGD) WITH PROPOFOL;  Surgeon: Josefine Class, MD;  Location: [facility] ENDOSCOPY;  Service: Endoscopy;  Laterality: N/A;  . ESOPHAGOGASTRODUODENOSCOPY (EGD) WITH PROPOFOL N/A 09/08/2017   Procedure: ESOPHAGOGASTRODUODENOSCOPY (EGD) WITH PROPOFOL;  Surgeon: Lucilla Lame, MD;  Location: [facility] ENDOSCOPY;  Service: Endoscopy;  Laterality: N/A;  . ESOPHAGOGASTRODUODENOSCOPY (EGD) WITH PROPOFOL N/A 09/21/2017   Procedure: ESOPHAGOGASTRODUODENOSCOPY (EGD) WITH PROPOFOL;  Surgeon: Lucilla Lame, MD;  Location: [facility] ENDOSCOPY;  Service: Endoscopy;  Laterality: N/A;  . IVC FILTER INSERTION N/A 09/08/2017   Procedure: IVC FILTER INSERTION;  Surgeon: Algernon [place], MD;  Location: [facility]  INVASIVE CV LAB;  Service: Cardiovascular;  Laterality: N/A;    Social History Social History   Tobacco Use  . Smoking status: Never Smoker  . Smokeless tobacco: Never Used  Substance Use Topics  . Alcohol use: No  . Drug use: No     Family History Family History  Problem Relation Age of Onset  . Deep vein thrombosis Neg Hx     Allergies  Allergen Reactions  . Sulfa Antibiotics      REVIEW OF SYSTEMS (Negative unless  checked)  Constitutional: [] Weight loss  [] Fever  [] Chills Cardiac: [] Chest pain   [] Chest pressure   [] Palpitations   [] Shortness of breath when laying flat   [] Shortness of breath at rest   [] Shortness of breath with exertion. Vascular:  [] Pain in legs with walking   [] Pain in legs at rest   [] Pain in legs when laying flat   [] Claudication   [] Pain in feet when walking  [] Pain in feet at rest  [] Pain in feet when laying flat   [x] History of DVT   [] Phlebitis   [x] Swelling in legs   [] Varicose veins   [] Non-healing ulcers Pulmonary:   [] Uses home oxygen   [] Productive cough   [] Hemoptysis   [] Wheeze  [] COPD   [] Asthma Neurologic:  [] Dizziness  [] Blackouts   [] Seizures   [] History of stroke   [] History of TIA  [] Aphasia   [] Temporary blindness   [] Dysphagia   [] Weakness or numbness in arms   [] Weakness or numbness in legs Musculoskeletal:  [] Arthritis   [] Joint swelling   [] Joint pain   [] Low back pain Hematologic:  [] Easy bruising  [] Easy bleeding   [] Hypercoagulable state   [x] Anemic   Gastrointestinal:  [] Blood in stool   [] Vomiting blood  [] Gastroesophageal reflux/heartburn   [] Abdominal pain Genitourinary:  [] Chronic kidney disease   [] Difficult urination  [] Frequent urination  [] Burning with urination   [] Hematuria Skin:  [] Rashes   [] Ulcers   [] Wounds Psychological:  [] History of anxiety   []  History of major depression.  Physical Examination  BP 130/82 (BP Location: Left Arm)   Pulse 94   Resp 16   Wt 102.6 kg (226 lb 3.2 oz)   BMI 33.40 kg/m  Gen:  WD/WN, NAD Head: [place]/AT, No temporalis wasting. Ear/Nose/Throat: Hearing grossly intact, nares w/o erythema or drainage, trachea midline Eyes: Conjunctiva clear. Sclera non-icteric Neck: Supple.  No JVD.  Pulmonary:  Good air movement, no use of accessory muscles.  Cardiac: RRR, normal S1, S2 Vascular:  Vessel Right Left  Radial Palpable Palpable                                    Musculoskeletal: M/S 5/5 throughout.  No  deformity or atrophy.  1-2+ right lower extremity edema. Neurologic: Sensation grossly intact in extremities.  Symmetrical.  Speech is fluent.  Psychiatric: Judgment intact, Mood & affect appropriate for pt's clinical situation.       Labs Recent Results (from the past 2160 hour(s))  Basic metabolic panel     Status: Abnormal   Collection Time: 09/07/17 12:44 PM  Result Value Ref Range   Sodium 137 135 - 145 mmol/L   Potassium 3.9 3.5 - 5.1 mmol/L   Chloride 105 101 - 111 mmol/L   CO2 24 22 - 32 mmol/L   Glucose, Bld 176 (H) 65 - 99 mg/dL   BUN 15 6 - 20 mg/dL   Creatinine,  Ser 0.84 0.44 - 1.00 mg/dL   Calcium 10.2 8.9 - 10.3 mg/dL   GFR calc non Af Amer >60 >60 mL/min   GFR calc Af Amer >60 >60 mL/min    Comment: (NOTE) The eGFR has been calculated using the CKD EPI equation. This calculation has not been validated in all clinical situations. eGFR's persistently <60 mL/min signify possible Chronic Kidney Disease.    Anion gap 8 5 - 15    Comment: Performed at [facility], [address] [place], [place] [address]  CBC     Status: Abnormal   Collection Time: 09/07/17 12:44 PM  Result Value Ref Range   WBC 8.8 3.6 - 11.0 K/uL   RBC 2.40 (L) 3.80 - 5.20 MIL/uL   Hemoglobin 7.4 (L) 12.0 - 16.0 g/dL   HCT 22.3 (L) 35.0 - 47.0 %   MCV 93.2 80.0 - 100.0 fL   MCH 30.8 26.0 - 34.0 pg   MCHC 33.1 32.0 - 36.0 g/dL   RDW 15.2 (H) 11.5 - 14.5 %   Platelets 372 150 - 440 K/uL    Comment: Performed at [facility], [address] [place], [place] [address]  Troponin I     Status: None   Collection Time: 09/07/17 12:44 PM  Result Value Ref Range   Troponin I <0.03 <0.03 ng/mL    Comment: Performed at [facility], [address] [place], [place] [address]  Hemoglobin A1c     Status: Abnormal   Collection Time: 09/07/17 12:44 PM  Result Value Ref Range   Hgb A1c MFr Bld 6.2 (H) 4.8 - 5.6 %    Comment: (NOTE) Pre diabetes:           5.7%-6.4% Diabetes:              >6.4% Glycemic control for   <7.0% adults with diabetes    Mean Plasma Glucose 131.24 mg/dL    Comment: Performed at [facility], [place] [address], [place], [place] [address]  Type and screen [place]     Status: None   Collection Time: 09/07/17  3:48 PM  Result Value Ref Range   ABO/RH(D) O POS    Antibody Screen NEG    Sample Expiration 09/10/2017    Unit Number [id number]    Blood Component Type RED CELLS,LR    Unit division 00    Status of Unit ISSUED,FINAL    Transfusion Status OK TO TRANSFUSE    Crossmatch Result      Compatible Performed at [facility], [address] [place], [place] [address]   BPAM RBC     Status: None   Collection Time: 09/07/17  3:48 PM  Result Value Ref Range   ISSUE DATE / TIME [id number]    Blood Product Unit Number [id number]    PRODUCT CODE [id number]    Unit Type and Rh 5100    Blood Product Expiration Date [id number]   Glucose, capillary     Status: Abnormal   Collection Time: 09/07/17  6:27 PM  Result Value Ref Range   Glucose-Capillary 123 (H) 65 - 99 mg/dL  Glucose, capillary     Status: Abnormal   Collection Time: 09/07/17  9:27 PM  Result Value Ref Range   Glucose-Capillary 128 (H) 65 - 99 mg/dL   Comment 1 Notify RN    Comment 2 Document in Chart   CBC     Status: Abnormal   Collection Time:  09/08/17  4:48 AM  Result Value Ref Range   WBC 7.3 3.6 - 11.0 K/uL   RBC 2.23 (L) 3.80 - 5.20 MIL/uL   Hemoglobin 6.9 (L) 12.0 - 16.0 g/dL   HCT 20.9 (L) 35.0 - 47.0 %   MCV 93.4 80.0 - 100.0 fL   MCH 30.7 26.0 - 34.0 pg   MCHC 32.8 32.0 - 36.0 g/dL   RDW 15.3 (H) 11.5 - 14.5 %   Platelets 339 150 - 440 K/uL    Comment: Performed at [facility], [address] [place], [place] [address]  HIV antibody     Status: None   Collection Time: 09/08/17  4:48 AM  Result Value Ref Range   HIV Screen 4th Generation wRfx Non Reactive Non Reactive     Comment: (NOTE) Performed At: [facility] [place], [place] [id number] Rush Farmer MD [id number] Performed at [facility], [address] [place], [place] [address]   ABO/Rh     Status: None   Collection Time: 09/08/17  4:48 AM  Result Value Ref Range   ABO/RH(D)      O POS Performed at [facility] [address] [place], [place] [address]   Glucose, capillary     Status: Abnormal   Collection Time: 09/08/17  7:36 AM  Result Value Ref Range   Glucose-Capillary 140 (H) 65 - 99 mg/dL  Prepare RBC     Status: None   Collection Time: 09/08/17  9:30 AM  Result Value Ref Range   Order Confirmation      ORDER PROCESSED BY BLOOD BANK Performed at [facility], [address], [place], [place] [address]   Surgical pathology     Status: None   Collection Time: 09/08/17 11:43 AM  Result Value Ref Range   SURGICAL PATHOLOGY      Surgical Pathology CASE: [id number] PATIENT: Zebedee Iba Surgical Pathology Report     SPECIMEN SUBMITTED: A. Stomach; cbx  CLINICAL HISTORY: None provided  PRE-OPERATIVE DIAGNOSIS: Melena  POST-OPERATIVE DIAGNOSIS: None provided.     DIAGNOSIS: A.  STOMACH; COLD BIOPSY: - MODERATE CHRONIC GASTRITIS. - NEGATIVE FOR H. PYLORI, DYSPLASIA AND MALIGNANCY.   GROSS DESCRIPTION:  A. Labeled: C BX gastric  Tissue fragment(s): 3  Size: 0.1-0.4 cm  Description: in formalin, tan fragments  Entirely submitted in 1 cassette(s).          Final Diagnosis performed by Delorse Lek, MD.  Electronically signed 09/11/2017 3:19:46PM    The electronic signature indicates that the named Attending Pathologist has evaluated the specimen  Technical component performed at [facility], [address], [place], [place] [address] Lab: [phone] Dir: Rush Farmer, MD, MMM  Professional component performed at [facility],  [place],  [place], [place] [address] Lab: [phone] Dir: Dellia Nims. Rubinas, MD    Glucose, capillary     Status: Abnormal   Collection Time: 09/08/17  5:06 PM  Result Value Ref Range   Glucose-Capillary 162 (H) 65 - 99 mg/dL  Hemoglobin and hematocrit, blood     Status: Abnormal   Collection Time: 09/08/17  8:05 PM  Result Value Ref Range   Hemoglobin 8.1 (L) 12.0 - 16.0 g/dL   HCT 24.6 (L) 35.0 - 47.0 %    Comment: Performed at [facility], [address] [place], [place] [address]  Glucose, capillary     Status: Abnormal   Collection Time: 09/08/17  9:21 PM  Result Value  Ref Range   Glucose-Capillary 186 (H) 65 - 99 mg/dL  Hemoglobin     Status: Abnormal   Collection Time: 09/09/17  7:12 AM  Result Value Ref Range   Hemoglobin 8.3 (L) 12.0 - 16.0 g/dL    Comment: Performed at [facility], [address] [place], [place] [address]  Glucose, capillary     Status: Abnormal   Collection Time: 09/09/17  7:37 AM  Result Value Ref Range   Glucose-Capillary 173 (H) 65 - 99 mg/dL  Glucose, capillary     Status: Abnormal   Collection Time: 09/09/17 11:37 AM  Result Value Ref Range   Glucose-Capillary 234 (H) 65 - 99 mg/dL  Comprehensive metabolic panel     Status: Abnormal   Collection Time: 09/17/17  4:17 PM  Result Value Ref Range   Sodium 137 135 - 145 mmol/L   Potassium 3.8 3.5 - 5.1 mmol/L   Chloride 104 101 - 111 mmol/L   CO2 22 22 - 32 mmol/L   Glucose, Bld 170 (H) 65 - 99 mg/dL   BUN 13 6 - 20 mg/dL   Creatinine, Ser 0.91 0.44 - 1.00 mg/dL   Calcium 10.6 (H) 8.9 - 10.3 mg/dL   Total Protein 8.0 6.5 - 8.1 g/dL   Albumin 3.8 3.5 - 5.0 g/dL   AST 24 15 - 41 U/L   ALT 17 14 - 54 U/L   Alkaline Phosphatase 101 38 - 126 U/L   Total Bilirubin 0.3 0.3 - 1.2 mg/dL   GFR calc non Af Amer >60 >60 mL/min   GFR calc Af Amer >60 >60 mL/min    Comment: (NOTE) The eGFR has been calculated using the CKD EPI equation. This calculation has not been validated in all clinical  situations. eGFR's persistently <60 mL/min signify possible Chronic Kidney Disease.    Anion gap 11 5 - 15    Comment: Performed at [facility], [address] [place], [place] [address]  CBC     Status: Abnormal   Collection Time: 09/17/17  4:17 PM  Result Value Ref Range   WBC 7.3 3.6 - 11.0 K/uL   RBC 3.20 (L) 3.80 - 5.20 MIL/uL   Hemoglobin 9.8 (L) 12.0 - 16.0 g/dL   HCT 30.5 (L) 35.0 - 47.0 %   MCV 95.2 80.0 - 100.0 fL   MCH 30.6 26.0 - 34.0 pg   MCHC 32.1 32.0 - 36.0 g/dL   RDW 19.2 (H) 11.5 - 14.5 %   Platelets 213 150 - 440 K/uL    Comment: Performed at [facility], [address] [place], [place] [address]  Type and screen Ordered by PROVIDER DEFAULT     Status: None   Collection Time: 09/17/17  5:37 PM  Result Value Ref Range   ABO/RH(D) O POS    Antibody Screen NEG    Sample Expiration      09/20/2017 Performed at [facility], [address], [place], [place] [address]   Surgical pathology     Status: None   Collection Time: 09/21/17  3:26 PM  Result Value Ref Range   SURGICAL PATHOLOGY      Surgical Pathology CASE: [id number] PATIENT: Zebedee Iba Surgical Pathology Report     SPECIMEN SUBMITTED: A. Stomach; cbx  CLINICAL HISTORY: History of PUD  PRE-OPERATIVE DIAGNOSIS: Gastric ulcer K25.9  POST-OPERATIVE DIAGNOSIS: Gastritis     DIAGNOSIS: A. STOMACH; COLD BIOPSY: - ACTIVE CHRONIC REACTIVE GASTRITIS CONSISTENT WITH HEALING MUCOSAL INJURY. -  NEGATIVE FOR H. PYLORI, DYSPLASIA, AND MALIGNANCY.   GROSS DESCRIPTION:  A. Labeled: C BX gastric  Tissue fragment(s): 2  Size: 0.1-0.5 cm  Description: in formalin, Tan fragments  Entirely submitted in one cassette(s).          Final Diagnosis performed by Quay Burow, MD.  Electronically signed 09/25/2017 9:25:41AM    The electronic signature indicates that the named Attending Pathologist has evaluated the specimen  Technical  component performed at [facility], [address], [place], [place] [address] Lab: [phone] Dir: Rush Farmer, MD, MMM  Professional compone nt performed at [facility], [facility], [address], [place], [place] [address] Lab: [phone] Dir: Dellia Nims. Rubinas, MD    Comprehensive metabolic panel     Status: Abnormal   Collection Time: 09/23/17  3:18 AM  Result Value Ref Range   Sodium 135 135 - 145 mmol/L   Potassium 4.5 3.5 - 5.1 mmol/L    Comment: HEMOLYSIS AT THIS LEVEL MAY AFFECT RESULT   Chloride 103 101 - 111 mmol/L   CO2 20 (L) 22 - 32 mmol/L   Glucose, Bld 201 (H) 65 - 99 mg/dL   BUN 12 6 - 20 mg/dL   Creatinine, Ser 0.91 0.44 - 1.00 mg/dL   Calcium 10.4 (H) 8.9 - 10.3 mg/dL   Total Protein 8.2 (H) 6.5 - 8.1 g/dL   Albumin 3.5 3.5 - 5.0 g/dL   AST 62 (H) 15 - 41 U/L    Comment: HEMOLYSIS AT THIS LEVEL MAY AFFECT RESULT   ALT 35 14 - 54 U/L   Alkaline Phosphatase 101 38 - 126 U/L   Total Bilirubin 1.1 0.3 - 1.2 mg/dL    Comment: HEMOLYSIS AT THIS LEVEL MAY AFFECT RESULT   GFR calc non Af Amer >60 >60 mL/min   GFR calc Af Amer >60 >60 mL/min    Comment: (NOTE) The eGFR has been calculated using the CKD EPI equation. This calculation has not been validated in all clinical situations. eGFR's persistently <60 mL/min signify possible Chronic Kidney Disease.    Anion gap 12 5 - 15    Comment: Performed at [facility], [address] [place], [place] [address]  CBC with Differential     Status: Abnormal   Collection Time: 09/23/17  3:18 AM  Result Value Ref Range   WBC 10.0 3.6 - 11.0 K/uL   RBC 3.15 (L) 3.80 - 5.20 MIL/uL   Hemoglobin 9.7 (L) 12.0 - 16.0 g/dL   HCT 29.0 (L) 35.0 - 47.0 %   MCV 91.9 80.0 - 100.0 fL   MCH 30.8 26.0 - 34.0 pg   MCHC 33.5 32.0 - 36.0 g/dL   RDW 17.5 (H) 11.5 - 14.5 %   Platelets 226 150 - 440 K/uL   Neutrophils Relative % 74 %   Neutro Abs 7.5 (H) 1.4 - 6.5 K/uL   Lymphocytes Relative 15 %   Lymphs  Abs 1.5 1.0 - 3.6 K/uL   Monocytes Relative 8 %   Monocytes Absolute 0.8 0.2 - 0.9 K/uL   Eosinophils Relative 2 %   Eosinophils Absolute 0.2 0 - 0.7 K/uL   Basophils Relative 1 %   Basophils Absolute 0.1 0 - 0.1 K/uL    Comment: Performed at [facility], [address], [place], [place] [address]    Radiology Dg Chest 2 View  Result Date: 09/07/2017 CLINICAL DATA:  Chest pain and pressure beginning a week ago. Occasional dizziness. EXAM: CHEST  2 VIEW  COMPARISON:  05/07/2012 FINDINGS: The patient has taken a poor inspiration. Heart size is normal. Mediastinal shadows are normal. There may be bronchial thickening but there is no infiltrate, collapse or effusion. Bony structures are unremarkable. IMPRESSION: Poor inspiration. Question bronchitis. No edema, consolidation or collapse. Electronically Signed   By: Nelson Chimes M.D.   On: 09/07/2017 13:34   US Venous Img Lower Unilateral Right  Result Date: 09/23/2017 CLINICAL DATA:  Right lower extremity pain and swelling. EXAM: Right LOWER EXTREMITY VENOUS DOPPLER ULTRASOUND TECHNIQUE: Gray-scale sonography with graded compression, as well as color Doppler and duplex ultrasound were performed to evaluate the lower extremity deep venous systems from the level of the common femoral vein and including the common femoral, femoral, profunda femoral, popliteal and calf veins including the posterior tibial, peroneal and gastrocnemius veins when visible. The superficial great saphenous vein was also interrogated. Spectral Doppler was utilized to evaluate flow at rest and with distal augmentation maneuvers in the common femoral, femoral and popliteal veins. COMPARISON:  Ultrasound of September 17, 2017. FINDINGS: Contralateral Common Femoral Vein: Respiratory phasicity is normal and symmetric with the symptomatic side. No evidence of thrombus. Normal compressibility. Common Femoral Vein: Noncompressible with no flow consistent with occlusive thrombus.  Saphenofemoral Junction: Noncompressible with no flow consistent with occlusive thrombus. Profunda Femoral Vein: Noncompressible with no flow consistent with occlusive thrombus. Femoral Vein: Noncompressible with no flow consistent with occlusive thrombus. Popliteal Vein: Noncompressible with no flow consistent with occlusive thrombus. Calf Veins: Posterior tibial and peroneal veins are noncompressible with no flow consistent with occlusive thrombus. Venous Reflux:  None. Other Findings:  None. IMPRESSION: There is again noted occlusive deep venous thrombosis of the right common femoral, superficial femoral, profunda femoral, popliteal, posterior tibial and peroneal veins. Electronically Signed   By: Marijo Conception, M.D.   On: 09/23/2017 02:37   US Venous Img Lower Unilateral Right  Result Date: 09/17/2017 CLINICAL DATA:  Increased swelling and pain in RIGHT lower extremity, known DVT, prior IVC filter placement EXAM: RIGHT LOWER EXTREMITY VENOUS DOPPLER ULTRASOUND TECHNIQUE: Gray-scale sonography with graded compression, as well as color Doppler and duplex ultrasound were performed to evaluate the lower extremity deep venous systems from the level of the common femoral vein and including the common femoral, femoral, profunda femoral, popliteal and calf veins including the posterior tibial, peroneal and gastrocnemius veins when visible. The superficial great saphenous vein was also interrogated. Spectral Doppler was utilized to evaluate flow at rest and with distal augmentation maneuvers in the common femoral, femoral and popliteal veins. COMPARISON:  08/29/2017 FINDINGS: Contralateral Common Femoral Vein: Respiratory phasicity is normal and symmetric with the symptomatic side. No evidence of thrombus. Normal compressibility. Common Femoral Vein: Acute appearing hypoechoic nonocclusive thrombus new since prior exam. Impaired compressibility and spontaneous venous flow. Saphenofemoral Junction: Nonocclusive  thrombus, new since prior exam Profunda Femoral Vein: No evidence of thrombus. Normal compressibility. Femoral Vein: Occlusive thrombus with impaired compressibility, new since prior exam Popliteal Vein: Occlusive thrombus with impaired compressibility Calf Veins: Occlusive thrombus with impaired compressibility Superficial Great Saphenous Vein: No evidence of thrombus. Normal compressibility. Venous Reflux:  None. Other Findings:  None. IMPRESSION: Propagation of deep venous thrombosis of the RIGHT lower extremity now involving the RIGHT common femoral and femoral veins in addition to previously identified RIGHT popliteal and calf veins. Electronically Signed   By: Lavonia Dana M.D.   On: 09/17/2017 17:57      Assessment/Plan  GI bleed This has precluded anticoagulation.  Once she has  been cleared from the gastroenterologist, I still think she would benefit from anticoagulation to reduce long-term pain and swelling.  She is not yet cleared  Hypertension blood pressure control important in reducing the progression of atherosclerotic disease. On appropriate oral medications.   Deep vein thrombosis (DVT) of right lower extremity (HCC) Duplex today shows more chronic appearing right lower extremity thrombus with partial recanalization throughout the venous segments of the right lower extremity. Compression stockings.  Being symptomatic does not surprise me with an extensive DVT without anticoagulation.  This was again stressed to the patient.  I did give her some tramadol for her discomfort.  Plan to see her back in about 3-4 months with duplex in follow-up.  She needs to continue wearing her compression stocking and elevating her leg.    Leotis Pain, MD  10/05/2017 1:02 PM    This note was created with Dragon medical transcription system.  Any errors from dictation are purely unintentional

## 2017-10-05 NOTE — Assessment & Plan Note (Signed)
This has precluded anticoagulation.  Once she has been cleared from the gastroenterologist, I still think she would benefit from anticoagulation to reduce long-term pain and swelling.  She is not yet cleared

## 2017-10-05 NOTE — Assessment & Plan Note (Signed)
Duplex today shows more chronic appearing right lower extremity thrombus with partial recanalization throughout the venous segments of the right lower extremity. Compression stockings.  Being symptomatic does not surprise me with an extensive DVT without anticoagulation.  This was again stressed to the patient.  I did give her some tramadol for her discomfort.  Plan to see her back in about 3-4 months with duplex in follow-up.  She needs to continue wearing her compression stocking and elevating her leg.

## 2017-10-05 NOTE — Assessment & Plan Note (Signed)
blood pressure control important in reducing the progression of atherosclerotic disease. On appropriate oral medications.  

## 2017-10-08 ENCOUNTER — Other Ambulatory Visit: Payer: Self-pay

## 2017-10-08 ENCOUNTER — Telehealth: Payer: Self-pay | Admitting: Gastroenterology

## 2017-10-08 MED ORDER — SUCRALFATE 1 GM/10ML PO SUSP: 1.0000 g | Freq: Four times a day (QID) | ORAL | 1 refills | Status: DC

## 2017-10-08 NOTE — Telephone Encounter (Signed)
Patient calling for biopsy results

## 2017-10-08 NOTE — Telephone Encounter (Signed)
Pt has been advised of EGD biopsy results. Pt stated she was still feeling really nauseated. She is currently taking Omeprazole 40mg  daily. She has some Zofran but stated its not working. I have spoken with Dr. Allen Norris and he has ok'd me to send Carafate to her pharmacy to help with the gastric ulcer and nausea.

## 2017-10-09 ENCOUNTER — Other Ambulatory Visit: Payer: Self-pay

## 2017-10-09 MED ORDER — SUCRALFATE 1 G PO TABS: 1.0000 g | ORAL_TABLET | Freq: Four times a day (QID) | ORAL | 5 refills | Status: AC

## 2017-10-15 ENCOUNTER — Inpatient Hospital Stay
Admission: EM | Admit: 2017-10-15 | Discharge: 2017-10-22 | DRG: 377 | Disposition: A | Discharge status: Home Health | Payer: BLUE CROSS/BLUE SHIELD | Attending: Internal Medicine | Admitting: Internal Medicine

## 2017-10-15 ENCOUNTER — Telehealth (INDEPENDENT_AMBULATORY_CARE_PROVIDER_SITE_OTHER): Payer: Self-pay

## 2017-10-15 ENCOUNTER — Telehealth: Payer: Self-pay | Admitting: Gastroenterology

## 2017-10-15 ENCOUNTER — Emergency Department: Payer: BLUE CROSS/BLUE SHIELD

## 2017-10-15 ENCOUNTER — Other Ambulatory Visit: Payer: Self-pay

## 2017-10-15 DIAGNOSIS — D649 Anemia, unspecified: Secondary | ICD-10-CM | POA: Diagnosis not present

## 2017-10-15 DIAGNOSIS — E1151 Type 2 diabetes mellitus with diabetic peripheral angiopathy without gangrene: Secondary | ICD-10-CM | POA: Diagnosis present

## 2017-10-15 DIAGNOSIS — K5903 Drug induced constipation: Secondary | ICD-10-CM | POA: Diagnosis not present

## 2017-10-15 DIAGNOSIS — K219 Gastro-esophageal reflux disease without esophagitis: Secondary | ICD-10-CM | POA: Diagnosis present

## 2017-10-15 DIAGNOSIS — C259 Malignant neoplasm of pancreas, unspecified: Secondary | ICD-10-CM | POA: Diagnosis not present

## 2017-10-15 DIAGNOSIS — C799 Secondary malignant neoplasm of unspecified site: Secondary | ICD-10-CM | POA: Diagnosis present

## 2017-10-15 DIAGNOSIS — I82220 Acute embolism and thrombosis of inferior vena cava: Secondary | ICD-10-CM | POA: Diagnosis present

## 2017-10-15 DIAGNOSIS — E119 Type 2 diabetes mellitus without complications: Secondary | ICD-10-CM

## 2017-10-15 DIAGNOSIS — I10 Essential (primary) hypertension: Secondary | ICD-10-CM | POA: Diagnosis present

## 2017-10-15 DIAGNOSIS — Z6834 Body mass index (BMI) 34.0-34.9, adult: Secondary | ICD-10-CM | POA: Diagnosis not present

## 2017-10-15 DIAGNOSIS — C7800 Secondary malignant neoplasm of unspecified lung: Secondary | ICD-10-CM | POA: Diagnosis present

## 2017-10-15 DIAGNOSIS — E1165 Type 2 diabetes mellitus with hyperglycemia: Secondary | ICD-10-CM | POA: Diagnosis present

## 2017-10-15 DIAGNOSIS — Z79890 Hormone replacement therapy (postmenopausal): Secondary | ICD-10-CM

## 2017-10-15 DIAGNOSIS — C7889 Secondary malignant neoplasm of other digestive organs: Secondary | ICD-10-CM | POA: Diagnosis present

## 2017-10-15 DIAGNOSIS — D6489 Other specified anemias: Secondary | ICD-10-CM | POA: Diagnosis not present

## 2017-10-15 DIAGNOSIS — Z90710 Acquired absence of both cervix and uterus: Secondary | ICD-10-CM

## 2017-10-15 DIAGNOSIS — Z79899 Other long term (current) drug therapy: Secondary | ICD-10-CM | POA: Diagnosis not present

## 2017-10-15 DIAGNOSIS — I82423 Acute embolism and thrombosis of iliac vein, bilateral: Secondary | ICD-10-CM | POA: Diagnosis present

## 2017-10-15 DIAGNOSIS — I82409 Acute embolism and thrombosis of unspecified deep veins of unspecified lower extremity: Secondary | ICD-10-CM | POA: Diagnosis not present

## 2017-10-15 DIAGNOSIS — R1084 Generalized abdominal pain: Secondary | ICD-10-CM

## 2017-10-15 DIAGNOSIS — K668 Other specified disorders of peritoneum: Secondary | ICD-10-CM

## 2017-10-15 DIAGNOSIS — I82401 Acute embolism and thrombosis of unspecified deep veins of right lower extremity: Secondary | ICD-10-CM | POA: Diagnosis present

## 2017-10-15 DIAGNOSIS — E46 Unspecified protein-calorie malnutrition: Secondary | ICD-10-CM | POA: Diagnosis not present

## 2017-10-15 DIAGNOSIS — Z86718 Personal history of other venous thrombosis and embolism: Secondary | ICD-10-CM | POA: Diagnosis not present

## 2017-10-15 DIAGNOSIS — D62 Acute posthemorrhagic anemia: Secondary | ICD-10-CM | POA: Diagnosis present

## 2017-10-15 DIAGNOSIS — K254 Chronic or unspecified gastric ulcer with hemorrhage: Principal | ICD-10-CM | POA: Diagnosis present

## 2017-10-15 DIAGNOSIS — E785 Hyperlipidemia, unspecified: Secondary | ICD-10-CM | POA: Diagnosis present

## 2017-10-15 DIAGNOSIS — F329 Major depressive disorder, single episode, unspecified: Secondary | ICD-10-CM | POA: Diagnosis present

## 2017-10-15 DIAGNOSIS — C787 Secondary malignant neoplasm of liver and intrahepatic bile duct: Secondary | ICD-10-CM | POA: Diagnosis not present

## 2017-10-15 DIAGNOSIS — E039 Hypothyroidism, unspecified: Secondary | ICD-10-CM | POA: Diagnosis present

## 2017-10-15 DIAGNOSIS — D689 Coagulation defect, unspecified: Secondary | ICD-10-CM | POA: Diagnosis not present

## 2017-10-15 DIAGNOSIS — K922 Gastrointestinal hemorrhage, unspecified: Secondary | ICD-10-CM | POA: Diagnosis not present

## 2017-10-15 DIAGNOSIS — I82413 Acute embolism and thrombosis of femoral vein, bilateral: Secondary | ICD-10-CM | POA: Diagnosis present

## 2017-10-15 DIAGNOSIS — Z9049 Acquired absence of other specified parts of digestive tract: Secondary | ICD-10-CM

## 2017-10-15 DIAGNOSIS — Z882 Allergy status to sulfonamides status: Secondary | ICD-10-CM

## 2017-10-15 DIAGNOSIS — K8689 Other specified diseases of pancreas: Secondary | ICD-10-CM

## 2017-10-15 DIAGNOSIS — D500 Iron deficiency anemia secondary to blood loss (chronic): Secondary | ICD-10-CM | POA: Diagnosis not present

## 2017-10-15 DIAGNOSIS — M79669 Pain in unspecified lower leg: Secondary | ICD-10-CM

## 2017-10-15 DIAGNOSIS — I82491 Acute embolism and thrombosis of other specified deep vein of right lower extremity: Secondary | ICD-10-CM | POA: Diagnosis not present

## 2017-10-15 DIAGNOSIS — R188 Other ascites: Secondary | ICD-10-CM | POA: Diagnosis present

## 2017-10-15 DIAGNOSIS — Y92230 Patient room in hospital as the place of occurrence of the external cause: Secondary | ICD-10-CM | POA: Diagnosis not present

## 2017-10-15 DIAGNOSIS — K5900 Constipation, unspecified: Secondary | ICD-10-CM | POA: Diagnosis not present

## 2017-10-15 DIAGNOSIS — T40605A Adverse effect of unspecified narcotics, initial encounter: Secondary | ICD-10-CM | POA: Diagnosis not present

## 2017-10-15 DIAGNOSIS — R600 Localized edema: Secondary | ICD-10-CM | POA: Diagnosis not present

## 2017-10-15 DIAGNOSIS — R55 Syncope and collapse: Secondary | ICD-10-CM

## 2017-10-15 DIAGNOSIS — C786 Secondary malignant neoplasm of retroperitoneum and peritoneum: Secondary | ICD-10-CM | POA: Diagnosis not present

## 2017-10-15 LAB — CBC WITH DIFFERENTIAL/PLATELET
Basophils Absolute: 0 10*3/uL (ref 0–0.1)
Basophils Relative: 0 %
Eosinophils Absolute: 0.3 10*3/uL (ref 0–0.7)
Eosinophils Relative: 2 %
HEMATOCRIT: 21.6 % — AB (ref 35.0–47.0)
HEMOGLOBIN: 6.6 g/dL — AB (ref 12.0–16.0)
LYMPHS ABS: 2.1 10*3/uL (ref 1.0–3.6)
LYMPHS PCT: 18 %
MCH: 27.7 pg (ref 26.0–34.0)
MCHC: 30.7 g/dL — ABNORMAL LOW (ref 32.0–36.0)
MCV: 90 fL (ref 80.0–100.0)
MONOS PCT: 10 %
Monocytes Absolute: 1.2 10*3/uL — ABNORMAL HIGH (ref 0.2–0.9)
NEUTROS ABS: 7.9 10*3/uL — AB (ref 1.4–6.5)
NEUTROS PCT: 70 %
Platelets: 233 10*3/uL (ref 150–440)
RBC: 2.4 MIL/uL — ABNORMAL LOW (ref 3.80–5.20)
RDW: 17 % — ABNORMAL HIGH (ref 11.5–14.5)
WBC: 11.5 10*3/uL — AB (ref 3.6–11.0)

## 2017-10-15 LAB — COMPREHENSIVE METABOLIC PANEL
ALK PHOS: 85 U/L (ref 38–126)
ALT: 20 U/L (ref 14–54)
AST: 33 U/L (ref 15–41)
Albumin: 3.1 g/dL — ABNORMAL LOW (ref 3.5–5.0)
Anion gap: 8 (ref 5–15)
BILIRUBIN TOTAL: 0.2 mg/dL — AB (ref 0.3–1.2)
BUN: 18 mg/dL (ref 6–20)
CALCIUM: 10.2 mg/dL (ref 8.9–10.3)
CO2: 24 mmol/L (ref 22–32)
CREATININE: 1.08 mg/dL — AB (ref 0.44–1.00)
Chloride: 102 mmol/L (ref 101–111)
GFR, EST NON AFRICAN AMERICAN: 54 mL/min — AB (ref >60)
Glucose, Bld: 241 mg/dL — ABNORMAL HIGH (ref 65–99)
Potassium: 4.1 mmol/L (ref 3.5–5.1)
SODIUM: 134 mmol/L — AB (ref 135–145)
TOTAL PROTEIN: 7.6 g/dL (ref 6.5–8.1)

## 2017-10-15 LAB — LIPASE, BLOOD: Lipase: 26 U/L (ref 11–51)

## 2017-10-15 LAB — PREPARE RBC (CROSSMATCH)

## 2017-10-15 LAB — TROPONIN I: Troponin I: <0.03 ng/mL (ref <0.03)

## 2017-10-15 MED ORDER — SODIUM CHLORIDE 0.9 % IV BOLUS (SEPSIS)
1000.0000 mL | Freq: Once | INTRAVENOUS | Status: AC
  Administered 2017-10-15: 1000 mL via INTRAVENOUS

## 2017-10-15 MED ORDER — MORPHINE SULFATE (PF) 4 MG/ML IV SOLN
4.0000 mg | Freq: Once | INTRAVENOUS | Status: AC
  Administered 2017-10-15: 4 mg via INTRAVENOUS
  Filled 2017-10-15: qty 1

## 2017-10-15 MED ORDER — SODIUM CHLORIDE 0.9 % IV SOLN
10.0000 mL/h | Freq: Once | INTRAVENOUS | Status: AC
  Administered 2017-10-16: 10 mL/h via INTRAVENOUS

## 2017-10-15 NOTE — ED Provider Notes (Signed)
[name] Emergency Department Provider Note  ____________________________________________   None    (approximate)  I have reviewed the triage vital signs and the nursing notes.   HISTORY  Chief Complaint Abdominal Pain   HPI Ann Larson is a 62 y.o. female with a history of DVT as well as upper GI bleeding who is presenting to the emergency department today with a near syncopal episode.  She says that she had pain to her upper abdomen which feels like a burning rating around to her back earlier this evening.  She says that she then stood up, began to feel sweaty and like she was going to pass out.  She was then able to lie down for about 10 minutes.  The feeling that she was lightheaded and passing out resolved.  However, she is persistently having a an 8 out of 10 diffuse abdominal pain which she describes as a burning and squeezing type pain.  She says this feels similarly to when she had her GI bleed.  She says that she has been taking iron supplementation and her stools have been dark and unchanged.  She does not report any burning with urination.  Denies any chest pain or shortness of breath.  Says that she has a DVT filter.  Not on any anticoagulation at this time secondary to GI bleeding.  DVT filter secondary to right lower extremity DVT.  Says that she has mild ongoing pain to her right lower extremity and she is taking muscle relaxers.  Initial blood pressure from EMS was 80 systolic.  Fluids given.  Past Medical History:  Diagnosis Date  . Depression   . Diabetes mellitus without complication ([place])   . Diverticulitis   . DVT (deep vein thrombosis) in pregnancy ([place]) 08/29/2017   Right popliteal and femoral  . GERD (gastroesophageal reflux disease)   . Hyperlipidemia   . Hypertension   . Hypothyroidism   . Obesity     Patient Active Problem List   Diagnosis Date Noted  . Hypertension 10/05/2017  . Deep vein thrombosis (DVT) of right  lower extremity ([place]) 09/26/2017  . Personal history of peptic ulcer disease   . Gastritis with hemorrhage   . Symptomatic anemia   . Heme + stool   . Blood in stool   . GI bleed 09/07/2017    Past Surgical History:  Procedure Laterality Date  . ABDOMINAL HYSTERECTOMY  2005  . CHOLECYSTECTOMY  2015  . COLONOSCOPY WITH PROPOFOL N/A 07/01/2015   Procedure: COLONOSCOPY WITH PROPOFOL;  Surgeon: Josefine Class, MD;  Location[facility] ENDOSCOPY;  Service: Endoscopy;  Laterality: N/A;  . ESOPHAGOGASTRODUODENOSCOPY (EGD) WITH PROPOFOL N/A 07/01/2015   Procedure: ESOPHAGOGASTRODUODENOSCOPY (EGD) WITH PROPOFOL;  Surgeon: Josefine Class, MD;  Location: [facility] ENDOSCOPY;  Service: Endoscopy;  Laterality: N/A;  . ESOPHAGOGASTRODUODENOSCOPY (EGD) WITH PROPOFOL N/A 09/08/2017   Procedure: ESOPHAGOGASTRODUODENOSCOPY (EGD) WITH PROPOFOL;  Surgeon: Lucilla Lame, MD;  Location: [facility] ENDOSCOPY;  Service: Endoscopy;  Laterality: N/A;  . ESOPHAGOGASTRODUODENOSCOPY (EGD) WITH PROPOFOL N/A 09/21/2017   Procedure: ESOPHAGOGASTRODUODENOSCOPY (EGD) WITH PROPOFOL;  Surgeon: Lucilla Lame, MD;  Location: [facility] ENDOSCOPY;  Service: Endoscopy;  Laterality: N/A;  . IVC FILTER INSERTION N/A 09/08/2017   Procedure: IVC FILTER INSERTION;  Surgeon: Algernon Huxley, MD;  Location: [place] CV LAB;  Service: Cardiovascular;  Laterality: N/A;    Prior to Admission medications   Medication Sig Start Date End Date Taking? Authorizing Provider  ferrous sulfate 325 (65 FE) MG EC  tablet Take 1 tablet (325 mg total) by mouth 2 (two) times daily. 09/09/17 11/08/17 Yes Sudini, Alveta Heimlich, MD  levothyroxine (SYNTHROID, LEVOTHROID) 125 MCG tablet Take 125 mcg by mouth daily before breakfast.   Yes [provider]  lisinopril (PRINIVIL,ZESTRIL) 20 MG tablet Take 20 mg by mouth daily. 07/04/17  Yes [provider]  metoprolol succinate (TOPROL-XL) 100 MG 24 hr tablet Take 50 mg by mouth daily. Take with or immediately  following a meal.    Yes [provider]  omeprazole (PRILOSEC) 40 MG capsule Take 1 capsule (40 mg total) by mouth 2 (two) times daily before a meal. 09/09/17  Yes Sudini, Alveta Heimlich, MD  oxyCODONE-acetaminophen (PERCOCET/ROXICET) 5-325 MG tablet Take 1 tablet by mouth every 4 (four) hours as needed for severe pain. 09/23/17  Yes Darel Hong, MD  pioglitazone (ACTOS) 45 MG tablet Take 45 mg by mouth daily. 06/18/17  Yes [provider]  simvastatin (ZOCOR) 20 MG tablet Take 20 mg by mouth daily.   Yes [provider]  sucralfate (CARAFATE) 1 g tablet Take 1 tablet (1 g total) by mouth 4 (four) times daily. 10/09/17  Yes Lucilla Lame, MD  tiZANidine (ZANAFLEX) 4 MG tablet Take 4 mg by mouth 3 (three) times daily.   Yes [provider]  TOUJEO SOLOSTAR 300 UNIT/ML SOPN Inject 30 Units into the skin daily.  09/05/17  Yes [provider]  venlafaxine XR (EFFEXOR-XR) 75 MG 24 hr capsule Take 75 mg by mouth daily with breakfast.   Yes [provider]    Allergies Sulfa antibiotics  Family History  Problem Relation Age of Onset  . Deep vein thrombosis Neg Hx     Social History Social History   Tobacco Use  . Smoking status: Never Smoker  . Smokeless tobacco: Never Used  Substance Use Topics  . Alcohol use: No  . Drug use: No    Review of Systems  Constitutional: No fever/chills Eyes: No visual changes. ENT: No sore throat. Cardiovascular: Denies chest pain. Respiratory: Denies shortness of breath. Gastrointestinal: No nausea, no vomiting.  No diarrhea.  No constipation. Genitourinary: Negative for dysuria. Musculoskeletal: Negative for back pain. Skin: Negative for rash. Neurological: Negative for headaches, focal weakness or numbness.   ____________________________________________   PHYSICAL EXAM:  VITAL SIGNS: ED Triage Vitals  Enc Vitals Group     BP 10/15/17 2215 105/63     Pulse Rate 10/15/17 2215 84     Resp  10/15/17 2215 17     Temp --      Temp src --      SpO2 10/15/17 2214 98 %     Weight 10/15/17 2217 226 lb (102.5 kg)     Height 10/15/17 2217 5\' 9"  (1.753 m)     Head Circumference --      Peak Flow --      Pain Score 10/15/17 2216 8     Pain Loc --      Pain Edu? --      Excl. in [place]? --     Constitutional: Alert and oriented.  Patient appears uncomfortable. Eyes: Conjunctivae are normal.  Head: Atraumatic. Nose: No congestion/rhinnorhea. Mouth/Throat: Mucous membranes are moist.  Neck: No stridor.   Cardiovascular: Normal rate, regular rhythm. Grossly normal heart sounds.  Respiratory: Normal respiratory effort.  No retractions. Lungs CTAB. Gastrointestinal: Soft with mild to moderate diffuse tenderness to palpation.  Worse tenderness to the upper abdomen without rebound or guarding. No distention.  Left-sided CVA  tenderness to palpation.  Digital rectal exam with green to black stool which is strongly heme positive. Musculoskeletal: No lower extremity tenderness nor edema.  No joint effusions. Neurologic:  Normal speech and language. No gross focal neurologic deficits are appreciated. Skin:  Skin is warm, dry and intact. No rash noted. Psychiatric: Mood and affect are normal. Speech and behavior are normal.  ____________________________________________   LABS (all labs ordered are listed, but only abnormal results are displayed)  Labs Reviewed  CBC WITH DIFFERENTIAL/PLATELET - Abnormal; Notable for the following components:      Result Value   WBC 11.5 (*)    RBC 2.40 (*)    Hemoglobin 6.6 (*)    HCT 21.6 (*)    MCHC 30.7 (*)    RDW 17.0 (*)    Neutro Abs 7.9 (*)    Monocytes Absolute 1.2 (*)    All other components within normal limits  TROPONIN I  COMPREHENSIVE METABOLIC PANEL  LIPASE, BLOOD  URINALYSIS, COMPLETE (UACMP) WITH MICROSCOPIC  TYPE AND SCREEN  PREPARE RBC (CROSSMATCH)   ____________________________________________  EKG  ED ECG REPORT I, Doran Stabler, the attending physician, personally viewed and interpreted this ECG.   Date: 10/15/2017  EKG Time: 2214  Rate: 85  Rhythm: normal sinus rhythm  Axis: Normal  Intervals:none  ST&T Change: Minimal ST elevation in the anterior leads versus baseline wander.  No ST depression or reciprocal T wave inversions.  ____________________________________________  RADIOLOGY  No active disease on the chest x-ray. Pending CT angiography of the abdomen and pelvis. ____________________________________________   PROCEDURES  Procedure(s) performed:   Procedures  Critical Care performed:   ____________________________________________   INITIAL IMPRESSION / ASSESSMENT AND PLAN / ED COURSE  Pertinent labs & imaging results that were available during my care of the patient were reviewed by me and considered in my medical decision making (see chart for details).  Differential diagnosis includes, but is not limited to, ACS, aortic dissection, pulmonary embolism, cardiac tamponade, pneumothorax, pneumonia, pericarditis, myocarditis, GI-related causes including esophagitis/gastritis, and musculoskeletal chest wall pain.   Differential diagnosis includes, but is not limited to, biliary disease (biliary colic, acute cholecystitis, cholangitis, choledocholithiasis, etc), intrathoracic causes for epigastric abdominal pain including ACS, gastritis, duodenitis, pancreatitis, small bowel or large bowel obstruction, abdominal aortic aneurysm, hernia, and gastritis. As part of my medical decision making, I reviewed the following data within the electronic MEDICAL RECORD NUMBER Notes from prior ED visits  ----------------------------------------- 11:22 PM on 10/15/2017 -----------------------------------------  Patient found to be anemic with a hemoglobin of 6.6.  We will transfuse 2 units of blood.  The patient will require admission to the hospital.  Pending additional lab test as well as CT of the  abdomen at this time.  Signed out to Dr. Jannifer Franklin of the medicine service.  Patient understanding of the treatment plan as well as diagnosis and willing to comply.    ____________________________________________   FINAL CLINICAL IMPRESSION(S) / ED DIAGNOSES  Near syncope.  GI bleed.  Symptomatic anemia.  Generalized abdominal pain.    NEW MEDICATIONS STARTED DURING THIS VISIT:  New Prescriptions   No medications on file     Note:  This document was prepared using Dragon voice recognition software and may include unintentional dictation errors.     Orbie Pyo, MD 10/15/17 [id number]

## 2017-10-15 NOTE — Telephone Encounter (Signed)
Patient called stating that she is having pain going down her leg and is in a lot of pain and wanted to know if we give shots for this, I explained that we did not give shots in our office. I did let her know that we could bring her in tomorrow to see Dr. Lucky Cowboy and he can assess the pain she is talking about. The patient refused the appt but wanted to know if I was sure we didn't give shots, again I explained that we did not. She  stated she would call her PCP and talk with them

## 2017-10-15 NOTE — H&P (Signed)
Ann Larson at [place] NAME: Ann Larson    MR#:  [id number]  DATE OF BIRTH:  [birth year]  DATE OF ADMISSION:  10/15/2017  PRIMARY CARE PHYSICIAN: Lorelee Market, MD   REQUESTING/REFERRING PHYSICIAN: Clearnce Hasten, MD  CHIEF COMPLAINT:   Chief Complaint  Patient presents with  . Abdominal Pain    HISTORY OF PRESENT ILLNESS:  Ann Larson  is a 62 y.o. female who presents with an episode of epigastric abdominal pain with some shortness of breath, fatigue, and near syncope tonight.  She was recently been here in the hospital and found to have a DVT.  She was started on Xarelto and had subsequent GI bleeding.  IVC filter was placed and she had endoscopy with cauterization of some gastric lesions.  She returns tonight and her hemoglobin is found to be 6.6.  She is also complaining of increasing right leg discomfort.  Hospitalist were called for admission  PAST MEDICAL HISTORY:   Past Medical History:  Diagnosis Date  . Depression   . Diabetes mellitus without complication ([place])   . Diverticulitis   . DVT (deep vein thrombosis) in pregnancy ([place]) 08/29/2017   Right popliteal and femoral  . GERD (gastroesophageal reflux disease)   . Hyperlipidemia   . Hypertension   . Hypothyroidism   . Obesity     PAST SURGICAL HISTORY:   Past Surgical History:  Procedure Laterality Date  . ABDOMINAL HYSTERECTOMY  2005  . CHOLECYSTECTOMY  2015  . COLONOSCOPY WITH PROPOFOL N/A 07/01/2015   Procedure: COLONOSCOPY WITH PROPOFOL;  Surgeon: Josefine Class, MD;  Location: [facility] ENDOSCOPY;  Service: Endoscopy;  Laterality: N/A;  . ESOPHAGOGASTRODUODENOSCOPY (EGD) WITH PROPOFOL N/A 07/01/2015   Procedure: ESOPHAGOGASTRODUODENOSCOPY (EGD) WITH PROPOFOL;  Surgeon: Josefine Class, MD;  Location: [facility] ENDOSCOPY;  Service: Endoscopy;  Laterality: N/A;  . ESOPHAGOGASTRODUODENOSCOPY (EGD) WITH PROPOFOL N/A 09/08/2017   Procedure:  ESOPHAGOGASTRODUODENOSCOPY (EGD) WITH PROPOFOL;  Surgeon: Lucilla Lame, MD;  Location: [facility] ENDOSCOPY;  Service: Endoscopy;  Laterality: N/A;  . ESOPHAGOGASTRODUODENOSCOPY (EGD) WITH PROPOFOL N/A 09/21/2017   Procedure: ESOPHAGOGASTRODUODENOSCOPY (EGD) WITH PROPOFOL;  Surgeon: Lucilla Lame, MD;  Location: [facility] ENDOSCOPY;  Service: Endoscopy;  Laterality: N/A;  . IVC FILTER INSERTION N/A 09/08/2017   Procedure: IVC FILTER INSERTION;  Surgeon: Algernon Huxley, MD;  Location: [place] CV LAB;  Service: Cardiovascular;  Laterality: N/A;    SOCIAL HISTORY:   Social History   Tobacco Use  . Smoking status: Never Smoker  . Smokeless tobacco: Never Used  Substance Use Topics  . Alcohol use: No    FAMILY HISTORY:   Family History  Problem Relation Age of Onset  . Deep vein thrombosis Neg Hx     DRUG ALLERGIES:   Allergies  Allergen Reactions  . Sulfa Antibiotics     MEDICATIONS AT HOME:   Prior to Admission medications   Medication Sig Start Date End Date Taking? Authorizing Provider  ferrous sulfate 325 (65 FE) MG EC tablet Take 1 tablet (325 mg total) by mouth 2 (two) times daily. 09/09/17 11/08/17 Yes Sudini, Alveta Heimlich, MD  levothyroxine (SYNTHROID, LEVOTHROID) 125 MCG tablet Take 125 mcg by mouth daily before breakfast.   Yes [provider]  lisinopril (PRINIVIL,ZESTRIL) 20 MG tablet Take 20 mg by mouth daily. 07/04/17  Yes [provider]  metoprolol succinate (TOPROL-XL) 100 MG 24 hr tablet Take 50 mg by mouth daily. Take with or immediately following a meal.    Yes [provider]  omeprazole (PRILOSEC) 40 MG capsule Take 1 capsule (40 mg total) by mouth 2 (two) times daily before a meal. 09/09/17  Yes Sudini, Alveta Heimlich, MD  oxyCODONE-acetaminophen (PERCOCET/ROXICET) 5-325 MG tablet Take 1 tablet by mouth every 4 (four) hours as needed for severe pain. 09/23/17  Yes Darel Hong, MD  pioglitazone (ACTOS) 45 MG tablet Take 45 mg by mouth daily. 06/18/17  Yes  [provider]  simvastatin (ZOCOR) 20 MG tablet Take 20 mg by mouth daily.   Yes [provider]  sucralfate (CARAFATE) 1 g tablet Take 1 tablet (1 g total) by mouth 4 (four) times daily. 10/09/17  Yes Lucilla Lame, MD  tiZANidine (ZANAFLEX) 4 MG tablet Take 4 mg by mouth 3 (three) times daily.   Yes [provider]  TOUJEO SOLOSTAR 300 UNIT/ML SOPN Inject 30 Units into the skin daily.  09/05/17  Yes [provider]  venlafaxine XR (EFFEXOR-XR) 75 MG 24 hr capsule Take 75 mg by mouth daily with breakfast.   Yes [provider]    REVIEW OF SYSTEMS:  Review of Systems  Constitutional: Negative for chills, fever, malaise/fatigue and weight loss.  HENT: Negative for ear pain, hearing loss and tinnitus.   Eyes: Negative for blurred vision, double vision, pain and redness.  Respiratory: Positive for shortness of breath. Negative for cough and hemoptysis.   Cardiovascular: Negative for chest pain, palpitations, orthopnea and leg swelling.  Gastrointestinal: Positive for abdominal pain. Negative for constipation, diarrhea, nausea and vomiting.  Genitourinary: Negative for dysuria, frequency and hematuria.  Musculoskeletal: Negative for back pain, joint pain and neck pain.  Skin:       No acne, rash, or lesions  Neurological: Positive for weakness. Negative for dizziness, tremors and focal weakness.  Endo/Heme/Allergies: Negative for polydipsia. Does not bruise/bleed easily.  Psychiatric/Behavioral: Negative for depression. The patient is not nervous/anxious and does not have insomnia.      VITAL SIGNS:   Vitals:   10/15/17 2214 10/15/17 2215 10/15/17 2217 10/15/17 2218  BP:  105/63    Pulse:  84    Resp:  17    SpO2: 98% 98%    Weight:   102.5 kg (226 lb) 107.2 kg (236 lb 5.3 oz)  Height:   5\' 9"  (1.753 m)    Wt Readings from Last 3 Encounters:  10/15/17 107.2 kg (236 lb 5.3 oz)  10/05/17 102.6 kg (226 lb 3.2 oz)  09/26/17 103.4 kg (228 lb)     PHYSICAL EXAMINATION:  Physical Exam  Vitals reviewed. Constitutional: She is oriented to person, place, and time. She appears well-developed and well-nourished. No distress.  HENT:  Head: Normocephalic and atraumatic.  Mouth/Throat: Oropharynx is clear and moist.  Eyes: Conjunctivae and EOM are normal. Pupils are equal, round, and reactive to light. No scleral icterus.  Neck: Normal range of motion. Neck supple. No JVD present. No thyromegaly present.  Cardiovascular: Normal rate, regular rhythm and intact distal pulses. Exam reveals no gallop and no friction rub.  No murmur heard. Respiratory: Effort normal and breath sounds normal. No respiratory distress. She has no wheezes. She has no rales.  GI: Soft. Bowel sounds are normal. She exhibits no distension. There is tenderness.  Musculoskeletal: Normal range of motion. She exhibits no edema.  No arthritis, no gout  Lymphadenopathy:    She has no cervical adenopathy.  Neurological: She is alert and oriented to person, place, and time. No cranial nerve deficit.  No dysarthria, no aphasia  Skin: Skin  is warm and dry. No rash noted. No erythema.  Psychiatric: She has a normal mood and affect. Her behavior is normal. Judgment and thought content normal.    LABORATORY PANEL:   CBC Recent Labs  Lab 10/15/17 2230  WBC 11.5*  HGB 6.6*  HCT 21.6*  PLT 233   ------------------------------------------------------------------------------------------------------------------  Chemistries  No results for input(s): NA, K, CL, CO2, GLUCOSE, BUN, CREATININE, CALCIUM, MG, AST, ALT, ALKPHOS, BILITOT in the last 168 hours.  Invalid input(s): GFRCGP ------------------------------------------------------------------------------------------------------------------  Cardiac Enzymes No results for input(s): TROPONINI in the last 168  hours. ------------------------------------------------------------------------------------------------------------------  RADIOLOGY:  Dg Chest 1 View  Result Date: 10/15/2017 CLINICAL DATA:  Upper abdominal pain EXAM: CHEST 1 VIEW COMPARISON:  09/07/2016 FINDINGS: The heart size and mediastinal contours are within normal limits. Both lungs are clear. The visualized skeletal structures are unremarkable. IMPRESSION: No active disease. Electronically Signed   By: Donavan Foil M.D.   On: 10/15/2017 22:48    EKG:   Orders placed or performed during the hospital encounter of 09/07/17  . EKG 12-Lead  . EKG 12-Lead  . ED EKG within 10 minutes  . ED EKG within 10 minutes  . EKG    IMPRESSION AND PLAN:  Principal Problem:   GI bleed -treatment of anemia as below, GI consult, PPI drip Active Problems:   Symptomatic anemia -2 units PRBC transfusion tonight, other treatment as above   Multiple lesions of metastatic malignancy ([place]) -called by radiologist after CT with contrast of the abdomen and pelvis.  Radiologist states that the patient has significant multiple metastatic lesions.  Patient had CT abdomen and pelvis about 8 months ago which showed a suspicious pancreatic lesion.  On chart review, patient had follow-up MRI about 7 months ago at [facility] which indicated the pancreatic lesion was likely inflammatory in nature.  We will get an oncology consult to help evaluate current lesion seen on CT scan.   Diabetes ([place]) -sliding scale insulin with corresponding glucose checks   Deep vein thrombosis (DVT) of right lower extremity ([place]) -CT imaging tonight indicates that her right lower extremity DVT is significantly larger than previously seen.  We will get a vascular surgery consult for further recommendation   Hypothyroidism -home dose thyroid replacement  All the records are reviewed and case discussed with ED provider. Management plans discussed with the patient and/or family.  DVT  PROPHYLAXIS: Mechanical only on left, DVT on right s/p IVC filter  GI PROPHYLAXIS: PPI  ADMISSION STATUS: Inpatient  CODE STATUS: Full Code Status History    Date Active Date Inactive Code Status Order ID Comments User Context   09/07/2017 16:09 09/09/2017 16:37 Full Code [id number]  Hillary Bow, MD ED      TOTAL TIME TAKING CARE OF THIS PATIENT: 45 minutes.   Kijuan Gallicchio Kirby 10/15/2017, 11:54 PM  [facility]  Office  [phone]  CC: Primary care physician; Lorelee Market, MD  Note:  This document was prepared using Dragon voice recognition software and may include unintentional dictation errors.

## 2017-10-15 NOTE — ED Triage Notes (Signed)
Pt arrives via ems from home. Ems reports clled out for leg pain that turned into abd pain. Pt states to ems " this feels like the pain I had back in January when I had a GI bleed" EMS reports administering 4 zofran, 400 mL NS in route. Pt a&o, pt reports pain but NAD at this time. Pt speaking to EDMD without becoming winded

## 2017-10-15 NOTE — Telephone Encounter (Signed)
pt l/m on vm stating she was feeling dizzy, weak and has to hold on to something seeing black, attempted to call pt but did not answer per Nurse pt needs to contact primary care or see ER

## 2017-10-16 ENCOUNTER — Inpatient Hospital Stay: Payer: BLUE CROSS/BLUE SHIELD

## 2017-10-16 ENCOUNTER — Other Ambulatory Visit: Payer: Self-pay

## 2017-10-16 ENCOUNTER — Encounter: Payer: Self-pay | Admitting: Radiology

## 2017-10-16 ENCOUNTER — Ambulatory Visit (INDEPENDENT_AMBULATORY_CARE_PROVIDER_SITE_OTHER): Payer: BLUE CROSS/BLUE SHIELD | Admitting: Vascular Surgery

## 2017-10-16 DIAGNOSIS — C799 Secondary malignant neoplasm of unspecified site: Secondary | ICD-10-CM | POA: Diagnosis present

## 2017-10-16 DIAGNOSIS — K668 Other specified disorders of peritoneum: Secondary | ICD-10-CM

## 2017-10-16 DIAGNOSIS — C259 Malignant neoplasm of pancreas, unspecified: Secondary | ICD-10-CM

## 2017-10-16 DIAGNOSIS — C786 Secondary malignant neoplasm of retroperitoneum and peritoneum: Secondary | ICD-10-CM

## 2017-10-16 DIAGNOSIS — I82491 Acute embolism and thrombosis of other specified deep vein of right lower extremity: Secondary | ICD-10-CM

## 2017-10-16 DIAGNOSIS — C787 Secondary malignant neoplasm of liver and intrahepatic bile duct: Secondary | ICD-10-CM

## 2017-10-16 DIAGNOSIS — R1084 Generalized abdominal pain: Secondary | ICD-10-CM

## 2017-10-16 DIAGNOSIS — C7800 Secondary malignant neoplasm of unspecified lung: Secondary | ICD-10-CM

## 2017-10-16 DIAGNOSIS — K8689 Other specified diseases of pancreas: Secondary | ICD-10-CM

## 2017-10-16 DIAGNOSIS — D6489 Other specified anemias: Secondary | ICD-10-CM

## 2017-10-16 LAB — URINALYSIS, COMPLETE (UACMP) WITH MICROSCOPIC
BILIRUBIN URINE: NEGATIVE
GLUCOSE, UA: NEGATIVE mg/dL
HGB URINE DIPSTICK: NEGATIVE
Ketones, ur: NEGATIVE mg/dL
LEUKOCYTES UA: NEGATIVE
NITRITE: NEGATIVE
PROTEIN: 30 mg/dL — AB
Specific Gravity, Urine: >1.046 — ABNORMAL HIGH (ref 1.005–1.030)
pH: 5 (ref 5.0–8.0)

## 2017-10-16 LAB — BASIC METABOLIC PANEL
ANION GAP: 10 (ref 5–15)
BUN: 22 mg/dL — ABNORMAL HIGH (ref 6–20)
CALCIUM: 10.3 mg/dL (ref 8.9–10.3)
CO2: 22 mmol/L (ref 22–32)
Chloride: 105 mmol/L (ref 101–111)
Creatinine, Ser: 1.18 mg/dL — ABNORMAL HIGH (ref 0.44–1.00)
GFR calc Af Amer: 56 mL/min — ABNORMAL LOW (ref >60)
GFR calc non Af Amer: 49 mL/min — ABNORMAL LOW (ref >60)
GLUCOSE: 176 mg/dL — AB (ref 65–99)
POTASSIUM: 4.2 mmol/L (ref 3.5–5.1)
Sodium: 137 mmol/L (ref 135–145)

## 2017-10-16 LAB — GLUCOSE, CAPILLARY
GLUCOSE-CAPILLARY: 179 mg/dL — AB (ref 65–99)
Glucose-Capillary: 175 mg/dL — ABNORMAL HIGH (ref 65–99)
Glucose-Capillary: 192 mg/dL — ABNORMAL HIGH (ref 65–99)

## 2017-10-16 LAB — CBC
HEMATOCRIT: 27.6 % — AB (ref 35.0–47.0)
HEMOGLOBIN: 8.8 g/dL — AB (ref 12.0–16.0)
MCH: 27.9 pg (ref 26.0–34.0)
MCHC: 32 g/dL (ref 32.0–36.0)
MCV: 87.2 fL (ref 80.0–100.0)
Platelets: 219 10*3/uL (ref 150–440)
RBC: 3.16 MIL/uL — ABNORMAL LOW (ref 3.80–5.20)
RDW: 16.6 % — ABNORMAL HIGH (ref 11.5–14.5)
WBC: 11.7 10*3/uL — ABNORMAL HIGH (ref 3.6–11.0)

## 2017-10-16 LAB — FERRITIN: FERRITIN: 112 ng/mL (ref 11–307)

## 2017-10-16 LAB — HEMOGLOBIN AND HEMATOCRIT, BLOOD
HCT: 28 % — ABNORMAL LOW (ref 35.0–47.0)
HEMATOCRIT: 27.6 % — AB (ref 35.0–47.0)
Hemoglobin: 8.9 g/dL — ABNORMAL LOW (ref 12.0–16.0)
Hemoglobin: 9 g/dL — ABNORMAL LOW (ref 12.0–16.0)

## 2017-10-16 LAB — IRON AND TIBC
Iron: 34 ug/dL (ref 28–170)
Saturation Ratios: 10 % — ABNORMAL LOW (ref 10.4–31.8)
TIBC: 340 ug/dL (ref 250–450)
UIBC: 306 ug/dL

## 2017-10-16 MED ORDER — SIMVASTATIN 20 MG PO TABS
20.0000 mg | ORAL_TABLET | Freq: Every day | ORAL | Status: DC
  Administered 2017-10-16 – 2017-10-21 (×5): 20 mg via ORAL
  Filled 2017-10-16 (×5): qty 1

## 2017-10-16 MED ORDER — TRAZODONE HCL 50 MG PO TABS
25.0000 mg | ORAL_TABLET | Freq: Every evening | ORAL | Status: DC | PRN
  Administered 2017-10-17: 25 mg via ORAL
  Filled 2017-10-16: qty 1

## 2017-10-16 MED ORDER — ONDANSETRON HCL 4 MG/2ML IJ SOLN
4.0000 mg | Freq: Four times a day (QID) | INTRAMUSCULAR | Status: DC | PRN
  Administered 2017-10-17: 4 mg via INTRAVENOUS
  Filled 2017-10-16: qty 2

## 2017-10-16 MED ORDER — VENLAFAXINE HCL ER 75 MG PO CP24
75.0000 mg | ORAL_CAPSULE | Freq: Every day | ORAL | Status: DC
  Administered 2017-10-19 – 2017-10-22 (×3): 75 mg via ORAL
  Filled 2017-10-16 (×7): qty 1

## 2017-10-16 MED ORDER — INSULIN ASPART 100 UNIT/ML ~~LOC~~ SOLN
0.0000 [IU] | Freq: Four times a day (QID) | SUBCUTANEOUS | Status: DC
  Administered 2017-10-16 – 2017-10-17 (×7): 2 [IU] via SUBCUTANEOUS
  Administered 2017-10-18 (×2): 3 [IU] via SUBCUTANEOUS
  Filled 2017-10-16 (×9): qty 1

## 2017-10-16 MED ORDER — ACETAMINOPHEN 325 MG PO TABS: 650.0000 mg | ORAL_TABLET | Freq: Four times a day (QID) | ORAL | Status: DC | PRN

## 2017-10-16 MED ORDER — METHOCARBAMOL 1000 MG/10ML IJ SOLN
500.0000 mg | Freq: Four times a day (QID) | INTRAVENOUS | Status: DC | PRN
  Administered 2017-10-16: 500 mg via INTRAVENOUS
  Filled 2017-10-16: qty 5

## 2017-10-16 MED ORDER — LEVOTHYROXINE SODIUM 25 MCG PO TABS
125.0000 ug | ORAL_TABLET | Freq: Every day | ORAL | Status: DC
  Administered 2017-10-17 – 2017-10-22 (×5): 125 ug via ORAL
  Filled 2017-10-16 (×5): qty 1

## 2017-10-16 MED ORDER — ACETAMINOPHEN 650 MG RE SUPP: 650.0000 mg | Freq: Four times a day (QID) | RECTAL | Status: DC | PRN

## 2017-10-16 MED ORDER — TECHNETIUM TC 99M DIETHYLENETRIAME-PENTAACETIC ACID
34.1040 | Freq: Once | INTRAVENOUS | Status: AC | PRN
  Administered 2017-10-16: 34.104 via RESPIRATORY_TRACT

## 2017-10-16 MED ORDER — SODIUM CHLORIDE 0.9 % IV SOLN
8.0000 mg/h | INTRAVENOUS | Status: AC
  Administered 2017-10-16 – 2017-10-18 (×6): 8 mg/h via INTRAVENOUS
  Filled 2017-10-16 (×6): qty 80

## 2017-10-16 MED ORDER — SODIUM CHLORIDE 0.9 % IV SOLN
80.0000 mg | Freq: Once | INTRAVENOUS | Status: AC
  Administered 2017-10-16: 02:00:00 80 mg via INTRAVENOUS
  Filled 2017-10-16: qty 80

## 2017-10-16 MED ORDER — MORPHINE SULFATE (PF) 2 MG/ML IV SOLN
2.0000 mg | INTRAVENOUS | Status: DC | PRN
  Administered 2017-10-16 – 2017-10-21 (×11): 2 mg via INTRAVENOUS
  Filled 2017-10-16 (×11): qty 1

## 2017-10-16 MED ORDER — TECHNETIUM TO 99M ALBUMIN AGGREGATED
4.0000 | Freq: Once | INTRAVENOUS | Status: AC | PRN
  Administered 2017-10-16: 4.008 via INTRAVENOUS

## 2017-10-16 MED ORDER — IOPAMIDOL (ISOVUE-370) INJECTION 76%
100.0000 mL | Freq: Once | INTRAVENOUS | Status: AC | PRN
  Administered 2017-10-16: 100 mL via INTRAVENOUS

## 2017-10-16 MED ORDER — PANTOPRAZOLE SODIUM 40 MG IV SOLR
40.0000 mg | Freq: Two times a day (BID) | INTRAVENOUS | Status: DC
  Administered 2017-10-19 – 2017-10-22 (×7): 40 mg via INTRAVENOUS
  Filled 2017-10-16 (×7): qty 40

## 2017-10-16 MED ORDER — TRAMADOL HCL 50 MG PO TABS
50.0000 mg | ORAL_TABLET | Freq: Four times a day (QID) | ORAL | Status: DC | PRN
  Administered 2017-10-16: 50 mg via ORAL
  Filled 2017-10-16 (×2): qty 1

## 2017-10-16 MED ORDER — SODIUM CHLORIDE 0.9 % IV SOLN
INTRAVENOUS | Status: AC
  Administered 2017-10-16: 02:00:00 via INTRAVENOUS

## 2017-10-16 MED ORDER — ONDANSETRON HCL 4 MG PO TABS: 4.0000 mg | ORAL_TABLET | Freq: Four times a day (QID) | ORAL | Status: DC | PRN

## 2017-10-16 NOTE — Progress Notes (Signed)
Chaplain followed up after Pt got diagnosis. Pt had been upset. Chaplain explored Pt's feelings and prayed for her.    10/16/17 1300  Clinical Encounter Type  Visited With Patient  Visit Type Follow-up  Referral From Nurse  Spiritual Encounters  Spiritual Needs Prayer

## 2017-10-16 NOTE — Consult Note (Signed)
Consultation  Referring Provider:     Dr Jannifer Franklin Admit date: 10/15/17 Consult date    10/16/17     Reason for Consultation:     GIB/anemia         HPI:   Ann Larson is a 62 y.o. female with history of Dm/recent right leg dVT 08/29/17- started on eliquis- had subsequent GIB - readmitted 09/07/17 & had EGD with dr Allen Norris which found hemorrhagic gastropathy. Anitcoagulant was stopped and she had  IVC filter placed,by Dr Lucky Cowboy. Seen again in ED with another DVT 09/17/17- plan was to clear to restart anticoagulation as she was not a candidate for thrombectomy. Had repeat EGD 09/21/17 that continued to show hemorrhagic gastritis. There was no h pylori/dysplasia/malignancy.  She was re admitted yesterday with epigastric abdominal pain/hgb 6.6. Has received 2u rpbc and been found to have multiple lesions of metastatic cancer on CT chest/abdomen/pelvis and a pancreatic lesion on MRI at [facility] imaging ordered by a local provdier 44m ago thought to be inflammatory in nature.  Her hemoglobin has improved to 8.8 today after transfusions.  Patient states today that her pain started a few days ago- increased for unknown reason- located in the epigastric area and radiates out laterally, sometimes hurts worse lying on her side. No known triggers, eating does not affect it. Morphine helps improve it. There has been some intermittent nausea but no vomiting. Had some mild constipation after narcotics that improved after taking stool softeners. Describes her stools as dark since starting oral iron, but not necessarily black. Denies rectal bleeding/other bowel habit changes. No further GI complaints.   Additionally had oncoloygy consult by Dr Tasia Catchings today. Tumor markers are pending. CT guided biopsy planned for tomorrow. She is scheduled for v/q scan today due to concerns over possible PE found on above CT Last colonoscopy 2016 by Dr Rayann Heman with diverticulosis.   Past Medical History:  Diagnosis Date  . Depression   . Diabetes  mellitus without complication ([place])   . Diverticulitis   . DVT (deep vein thrombosis) in pregnancy ([place]) 08/29/2017   Right popliteal and femoral  . GERD (gastroesophageal reflux disease)   . Hyperlipidemia   . Hypertension   . Hypothyroidism   . Obesity     Past Surgical History:  Procedure Laterality Date  . ABDOMINAL HYSTERECTOMY  2005  . CHOLECYSTECTOMY  2015  . COLONOSCOPY WITH PROPOFOL N/A 07/01/2015   Procedure: COLONOSCOPY WITH PROPOFOL;  Surgeon: Josefine Class, MD;  [facility] END[facility];  Service: Endoscopy;  Laterality: N/A;  . ESOPHAGOGASTRODUODENOSCOPY (EGD) WITH PROPOFOL N/A 07/01/2015   Procedure: ESOPHAGOGASTRODUODENOSCOPY (EGD) WITH PROPOFOL;  Surgeon: Josefine Class, MD;  Location: [facility] ENDOSCOPY;  Service: Endoscopy;  Laterality: N/A;  . ESOPHAGOGASTRODUODENOSCOPY (EGD) WITH PROPOFOL N/A 09/08/2017   Procedure: ESOPHAGOGASTRODUODENOSCOPY (EGD) WITH PROPOFOL;  Surgeon: Lucilla Lame, MD;  Location: [facility] ENDOSCOPY;  Service: Endoscopy;  Laterality: N/A;  . ESOPHAGOGASTRODUODENOSCOPY (EGD) WITH PROPOFOL N/A 09/21/2017   Procedure: ESOPHAGOGASTRODUODENOSCOPY (EGD) WITH PROPOFOL;  Surgeon: Lucilla Lame, MD;  Location: [facility] ENDOSCOPY;  Service: Endoscopy;  Laterality: N/A;  . IVC FILTER INSERTION N/A 09/08/2017   Procedure: IVC FILTER INSERTION;  Surgeon: Algernon Huxley, MD;  Location: [place] [facility];  Service: Cardiovascular;  Laterality: N/A;    Family History  Problem Relation Age of Onset  . Deep vein thrombosis Neg Hx     Social History   Tobacco Use  . Smoking status: Never Smoker  . Smokeless tobacco: Never Used  Substance Use Topics  .  Alcohol use: No  . Drug use: No    Prior to Admission medications   Medication Sig Start Date End Date Taking? Authorizing Provider  ferrous sulfate 325 (65 FE) MG EC tablet Take 1 tablet (325 mg total) by mouth 2 (two) times daily. 09/09/17 11/08/17 Yes Sudini, Alveta Heimlich, MD  levothyroxine (SYNTHROID,  LEVOTHROID) 125 MCG tablet Take 125 mcg by mouth daily before breakfast.   Yes [provider]  lisinopril (PRINIVIL,ZESTRIL) 20 MG tablet Take 20 mg by mouth daily. 07/04/17  Yes [provider]  metoprolol succinate (TOPROL-XL) 100 MG 24 hr tablet Take 50 mg by mouth daily. Take with or immediately following a meal.    Yes [provider]  omeprazole (PRILOSEC) 40 MG capsule Take 1 capsule (40 mg total) by mouth 2 (two) times daily before a meal. 09/09/17  Yes Sudini, Alveta Heimlich, MD  oxyCODONE-acetaminophen (PERCOCET/ROXICET) 5-325 MG tablet Take 1 tablet by mouth every 4 (four) hours as needed for severe pain. 09/23/17  Yes Darel Hong, MD  pioglitazone (ACTOS) 45 MG tablet Take 45 mg by mouth daily. 06/18/17  Yes [provider]  simvastatin (ZOCOR) 20 MG tablet Take 20 mg by mouth daily.   Yes [provider]  sucralfate (CARAFATE) 1 g tablet Take 1 tablet (1 g total) by mouth 4 (four) times daily. 10/09/17  Yes Lucilla Lame, MD  tiZANidine (ZANAFLEX) 4 MG tablet Take 4 mg by mouth 3 (three) times daily.   Yes [provider]  TOUJEO SOLOSTAR 300 UNIT/ML SOPN Inject 30 Units into the skin daily.  09/05/17  Yes [provider]  venlafaxine XR (EFFEXOR-XR) 75 MG 24 hr capsule Take 75 mg by mouth daily with breakfast.   Yes [provider]    Current Facility-Administered Medications  Medication Dose Route Frequency Provider Last Rate Last Dose  . acetaminophen (TYLENOL) tablet 650 mg  650 mg Oral Q6H PRN Lance Coon, MD       Or  . acetaminophen (TYLENOL) suppository 650 mg  650 mg Rectal Q6H PRN Lance Coon, MD      . insulin aspart (novoLOG) injection 0-9 Units  0-9 Units Subcutaneous Q6H Lance Coon, MD   2 Units at 10/16/17 1220  . levothyroxine (SYNTHROID, LEVOTHROID) tablet 125 mcg  125 mcg Oral QAC breakfast Lance Coon, MD   Stopped at 10/16/17 [phone]  . methocarbamol (ROBAXIN) 500 mg in dextrose 5 % 50 mL IVPB   500 mg Intravenous Q6H PRN Lance Coon, MD   Stopped at 10/16/17 [phone]  . morphine 2 MG/ML injection 2 mg  2 mg Intravenous Q4H PRN Lance Coon, MD   2 mg at 10/16/17 1219  . ondansetron (ZOFRAN) tablet 4 mg  4 mg Oral Q6H PRN Lance Coon, MD       Or  . ondansetron [facility]) injection 4 mg  4 mg Intravenous Q6H PRN Lance Coon, MD      . pantoprazole (PROTONIX) 80 mg in sodium chloride 0.9 % 250 mL (0.32 mg/mL) infusion  8 mg/hr Intravenous Continuous Lance Coon, MD 25 mL/hr at 10/16/17 0258 8 mg/hr at 10/16/17 0258  . [START ON 10/19/2017] pantoprazole (PROTONIX) injection 40 mg  40 mg Intravenous Laurence Spates, MD      . simvastatin (ZOCOR) tablet 20 mg  20 mg Oral Daily Lance Coon, MD      . venlafaxine XR [facility]) 24 hr capsule 75 mg  75 mg Oral Q breakfast Lance Coon, MD   Stopped at 10/16/17 [phone]  Allergies as of 10/15/2017 - Review Complete 10/15/2017  Allergen Reaction Noted  . Sulfa antibiotics  06/30/2015     Review of Systems:    All systems reviewed and negative except where noted in HPI.  Gen: Denies any fever, chills, sweats, anorexia, fatigue, weakness, malaise, weight loss, and sleep disorder CV: Denies chest pain, angina, palpitations, syncope, orthopnea, PND, peripheral edema, and claudication. Resp: Denies dyspnea at rest, dyspnea with exercise, cough, sputum, wheezing, coughing up blood, and pleurisy. GI: Denies vomiting blood, jaundice, and fecal incontinence.   Denies dysphagia or odynophagia. GU : Denies urinary burning, blood in urine, urinary frequency, urinary hesitancy, nocturnal urination, and urinary incontinence. MS: Denies joint pain, limitation of movement, and swelling, stiffness, low back pain, extremity pain. Denies muscle weakness, cramps, atrophy.  Derm: Denies rash, itching, dry skin, hives, moles, warts, or unhealing ulcers.  Psych: Denies depression, anxiety, memory loss, suicidal ideation, hallucinations, paranoia, and  confusion. Heme: Denies bruising, bleeding, and enlarged lymph nodes. Neuro:  Denies any headaches, dizziness, paresthesias. Endo:  Denies any problems with DM, thyroid, adrenal function.    Physical Exam:  Vital signs in last 24 hours: Temp:  [98.4 F (36.9 C)-99.3 F (37.4 C)] 98.4 F (36.9 C) (02/26 0814) Pulse Rate:  [80-99] 97 (02/26 0814) Resp:  [14-28] 18 (02/26 0814) BP: (87-125)/(49-66) 123/58 (02/26 0814) SpO2:  [93 %-99 %] 93 % (02/26 0814) Weight:  [102.5 kg (226 lb)-107.2 kg (236 lb 5.3 oz)] 105.7 kg (233 lb 0.4 oz) (02/26 0149) Last BM Date: 10/14/17 General:   Pleasant woman in NAD Head:  Normocephalic and atraumatic. Eyes:   No icterus.   Conjunctiva pink. Ears:  Normal auditory acuity. Mouth: Mucosa pink moist, no lesions. Neck:  Supple; no masses felt Lungs: Respirations even and unlabored. Lungs clear to auscultation bilaterally.   No wheezes, crackles, or rhonchi.  Heart:  S1S2, RRR, no MRG. No edema. Abdomen:   Flat, soft, nondistended, minimal epigastric tenderness. Normal bowel sounds. No appreciable masses or hepatomegaly. No rebound signs or other peritoneal signs. Rectal:  Not performed.  Msk:  MAEW x4, No clubbing or cyanosis. Strength 5/5. Symmetrical without gross deformities. Neurologic:  Alert and  oriented x4;  Cranial nerves II-XII intact.  Skin:  Warm, dry, pink without significant lesions or rashes. Psych:  Alert and cooperative. Normal affect.  LAB RESULTS: Recent Labs    10/15/17 2230 10/16/17 0846  WBC 11.5* 11.7*  HGB 6.6* 8.8*  HCT 21.6* 27.6*  PLT 233 219   BMET Recent Labs    10/15/17 2230 10/16/17 0846  NA 134* 137  K 4.1 4.2  CL 102 105  CO2 24 22  GLUCOSE 241* 176*  BUN 18 22*  CREATININE 1.08* 1.18*  CALCIUM 10.2 10.3   LFT Recent Labs    10/15/17 2230  PROT 7.6  ALBUMIN 3.1*  AST 33  ALT 20  ALKPHOS 85  BILITOT 0.2*   PT/INR No results for input(s): LABPROT, INR in the last 72 hours.  STUDIES: Dg  Chest 1 View  Result Date: 10/15/2017 CLINICAL DATA:  Upper abdominal pain EXAM: CHEST 1 VIEW COMPARISON:  09/07/2016 FINDINGS: The heart size and mediastinal contours are within normal limits. Both lungs are clear. The visualized skeletal structures are unremarkable. IMPRESSION: No active disease. Electronically Signed   By: Donavan Foil M.D.   On: 10/15/2017 22:48   Ct Angio Abd/pel W And/or Wo Contrast  Result Date: 10/16/2017 CLINICAL DATA:  62 year old female with history of pancreatitis presents  with leg and abdominal pain. EXAM: CTA ABDOMEN AND PELVIS wITHOUT AND WITH CONTRAST TECHNIQUE: Multidetector CT imaging of the abdomen and pelvis was performed using the standard protocol during bolus administration of intravenous contrast. Multiplanar reconstructed images and MIPs were obtained and reviewed to evaluate the vascular anatomy. CONTRAST:  121mL ISOVUE-370 IOPAMIDOL (ISOVUE-370) INJECTION 76% COMPARISON:  Abdominal CT dated 02/07/2017 FINDINGS: VASCULAR Aorta: Mild atherosclerotic calcification. No aneurysmal dilatation or dissection. Celiac: Patent without evidence of aneurysm, dissection, vasculitis or significant stenosis. SMA: Patent without evidence of aneurysm, dissection, vasculitis or significant stenosis. Renals: Both renal arteries are patent without evidence of aneurysm, dissection, vasculitis, fibromuscular dysplasia or significant stenosis. IMA: Patent without evidence of aneurysm, dissection, vasculitis or significant stenosis. Inflow: Mild atherosclerotic calcification of the iliac vessels. No aneurysmal dilatation or dissection. The iliac arteries are patent bilaterally. Proximal Outflow: Bilateral common femoral and visualized portions of the superficial and profunda femoral arteries are patent without evidence of aneurysm, dissection, vasculitis or significant stenosis. Veins: Extensive near occlusive thrombus extending from the visualized portions of the common femoral veins,  external and common iliac veins and IVC to the level of the IVC filter. There is small amount of contrast in the peripheral lumen of these vessels. The suprarenal IVC filled with contrast and appears patent. No definite thrombus identified above the level of the IVC filter. The SMV, the SMV, and main portal vein are patent. 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 No portal venous gas. Review of the MIP images confirms the above findings. NON-VASCULAR Lower chest: Multiple bilateral pulmonary nodules most consistent with metastatic disease. An infectious process or septic emboli are less likely. Clinical correlation is recommended. Partially visualized area of lower attenuation in the right lower lobe/infrahilar vascular confluence (series 6, image 5). This is incompletely evaluated and may represent volume averaging artifact with perihilar fat or scarring. Acute pulmonary embolus is not entirely excluded. Clinical correlation is recommended. If there is clinical concern for acute PE further evaluation with CTA of the chest or V/Q scan recommended. There is no intra-abdominal free air. Small upper abdominal ascites as well as small amount of fluid within the pelvis. Hepatobiliary: An ill-defined focal area of heterogeneous enhancement is seen in the left lobe of the liver (series 6, image 23). The liver is otherwise unremarkable. No intrahepatic biliary ductal dilatation. Cholecystectomy. Pancreas: There is fatty infiltration of the uncinate and head of the pancreas. There is prominence of the soft tissues of the distal body and tail of the pancreas which may represent extension of the previously seen pancreatic mass or pancreatitis. Correlation with pancreatic enzymes recommended. Spleen: Multiple small splenic hypodense lesions appear new or more prominent compared to the prior CT and concerning for extension of pancreatic neoplasm. Areas of infarct is less likely but not  excluded. Adrenals/Urinary Tract: The adrenal glands are unremarkable. The kidneys, and the visualized ureters appear unremarkable as well. The urinary bladder is predominantly collapsed. There is apparent diffuse thickening of the bladder wall which may be partly related to underdistention. Cystitis is not excluded. Correlation with urinalysis recommended. Stomach/Bowel: Postsurgical changes of bowel with anastomotic suture in the sigmoid colon. There is sigmoid diverticulosis without active inflammatory changes. Moderate amount of stool noted throughout the colon no bowel obstruction. Normal appendix. Lymphatic: Mildly enlarged lymph node in the porta hepaticas measuring 14 mm in short axis. Multiple mildly rounded small retroperitoneal and para-aortic lymph nodes. Reproductive: Hysterectomy. Other: Extensive omental implants and nodularity, new compared to  prior CT and consistent with omental caking. Musculoskeletal: Degenerative changes of the spine. No acute osseous pathology. IMPRESSION: VASCULAR 1. Extensive near complete occlusive thrombus extending from the common femoral veins to the IVC at the level of the IVC filter. No definite thrombus noted above the IVC. 2. Partially visualized small low-attenuation in the right lower lobe/right infrahilar pulmonary artery confluence may represent perivascular hilar fat or scarring. Acute PE is not entirely excluded. CTA of the chest or V/Q scan may provide better evaluation if there is clinical concern for acute PE. 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 NON-VASCULAR 1. Enlargement of the distal body and tail of the pancreas likely from extension of the previously seen pancreatic mass. 2. Metastatic disease including omental implant, splenic lesions, and pulmonary nodules. Small ascites, likely malignant. Further evaluation with PET-CT and oncological consult is advised. 3. Mildly enlarged porta hepatic lymph node as well as slightly rounded retroperitoneal and  para-aortic lymph nodes. 4. Focal ill-defined heterogeneous enhancement of the left lobe of the liver. These results were called by telephone at the time of interpretation on 10/16/2017 at 1:28 am to Dr. Jannifer Franklin, who verbally acknowledged these results. Electronically Signed   By: Anner Crete M.D.   On: 10/16/2017 01:29       Impression / Plan:   1. Epigastric abdominal pain/symptomatic anemia. Has had 2 recent egds with hemorrhagic gastritis- and now with findings of lesions suspicious for metastatic cancer, possibley of pancreatic origin.  Will discuss the role of EGD with Dr Alice Reichert. Her bleeding/anemia may be multifactorial in nature given her other health concerns. Agree with PPI/pain control/transfusion prn.  Thank you very much for this consult. These services were provided by Stephens November, NP-C, in collaboration with Lollie Sails, MD, with whom I have discussed this patient in full.   Stephens November, NP-C   Addendum: did discuss patient with Dr Alice Reichert- we will offer EGD to try to assess for any active bleeding- may need repeat argon cauterization. Discussed indication/risks/benefits with patient- she states she wants to think about it and may want to have Dr Allen Norris do her procedure as he has done this before. She will let us know.  Dr Alice Reichert plans to come by and see her tonight.

## 2017-10-16 NOTE — Progress Notes (Signed)
Chaplain ws rounding and nurse referred Pt for support. Pt was alert and talkative. Pt was wondering about diagnosis. Not knowing is hard. Ch. Talked about family and other issues. Ch. Prayed with Pt in which she participated with call and response.    10/16/17 1000  Clinical Encounter Type  Visited With Patient  Visit Type Initial  Referral From Nurse  Spiritual Encounters  Spiritual Needs Prayer;Emotional

## 2017-10-16 NOTE — Progress Notes (Signed)
Patient ID: Ann Larson, female   DOB: 11/27/1955, 62 y.o.   MRN: [id number] Request for CT guided omental biopsy.  The imaging has been reviewed and the omental/peritoneal thickening is amendable to percutaneous biopsy.  Anticipate CT guided biopsy tomorrow on 9/27.

## 2017-10-16 NOTE — Progress Notes (Signed)
[facility] [place] NAME: Ann Larson    MR#:  [id number]  DATE OF BIRTH:  1956/04/29  SUBJECTIVE:  CHIEF COMPLAINT: Patient's abdominal pain is reasonably controlled at this time, had a EGD recently for a GI bleed by Dr. Allen Norris.  There is any active bleeding at this time.  Denies any dizziness or chest pain  REVIEW OF SYSTEMS:  CONSTITUTIONAL: No fever, fatigue or weakness.  EYES: No blurred or double vision.  EARS, NOSE, AND THROAT: No tinnitus or ear pain.  RESPIRATORY: No cough, shortness of breath, wheezing or hemoptysis.  CARDIOVASCULAR: No chest pain, orthopnea, edema.  GASTROINTESTINAL: No nausea, vomiting, diarrhea or abdominal pain.  GENITOURINARY: No dysuria, hematuria.  ENDOCRINE: No polyuria, nocturia,  HEMATOLOGY: No anemia, easy bruising or bleeding SKIN: No rash or lesion. MUSCULOSKELETAL: No joint pain or arthritis.   NEUROLOGIC: No tingling, numbness, weakness.  PSYCHIATRY: No anxiety or depression.   DRUG ALLERGIES:   Allergies  Allergen Reactions  . Sulfa Antibiotics     VITALS:  Blood pressure (!) 123/58, pulse 97, temperature 98.4 F (36.9 C), temperature source Oral, resp. rate 18, height 5\' 9"  (1.753 m), weight 105.7 kg (233 lb 0.4 oz), SpO2 93 %.  PHYSICAL EXAMINATION:  GENERAL:  62 y.o.-year-old patient lying in the bed with no acute distress.  EYES: Pupils equal, round, reactive to light and accommodation. No scleral icterus. Extraocular muscles intact.  HEENT: Head atraumatic, normocephalic. Oropharynx and nasopharynx clear.  NECK:  Supple, no jugular venous distention. No thyroid enlargement, no tenderness.  LUNGS: Normal breath sounds bilaterally, no wheezing, rales,rhonchi or crepitation. No use of accessory muscles of respiration.  CARDIOVASCULAR: S1, S2 normal. No murmurs, rubs, or gallops.  ABDOMEN: Soft, nontender, nondistended. Bowel sounds present.  EXTREMITIES: No pedal edema,  cyanosis, or clubbing.  NEUROLOGIC: Cranial nerves II through XII are intact. Muscle strength 5/5 in all extremities. Sensation intact. Gait not checked.  PSYCHIATRIC: The patient is alert and oriented x 3.  SKIN: No obvious rash, lesion, or ulcer.    LABORATORY PANEL:   CBC Recent Labs  Lab 10/16/17 0846  WBC 11.7*  HGB 8.8*  HCT 27.6*  PLT 219   ------------------------------------------------------------------------------------------------------------------  Chemistries  Recent Labs  Lab 10/15/17 2230 10/16/17 0846  NA 134* 137  K 4.1 4.2  CL 102 105  CO2 24 22  GLUCOSE 241* 176*  BUN 18 22*  CREATININE 1.08* 1.18*  CALCIUM 10.2 10.3  AST 33  --   ALT 20  --   ALKPHOS 85  --   BILITOT 0.2*  --    ------------------------------------------------------------------------------------------------------------------  Cardiac Enzymes Recent Labs  Lab 10/15/17 2230  TROPONINI <0.03   ------------------------------------------------------------------------------------------------------------------  RADIOLOGY:  Dg Chest 1 View  Result Date: 10/15/2017 CLINICAL DATA:  Upper abdominal pain EXAM: CHEST 1 VIEW COMPARISON:  09/07/2016 FINDINGS: The heart size and mediastinal contours are within normal limits. Both lungs are clear. The visualized skeletal structures are unremarkable. IMPRESSION: No active disease. Electronically Signed   By: Donavan Foil M.D.   On: 10/15/2017 22:48   Ct Angio Abd/pel W And/or Wo Contrast  Result Date: 10/16/2017 CLINICAL DATA:  62 year old female with history of pancreatitis presents with leg and abdominal pain. EXAM: CTA ABDOMEN AND PELVIS wITHOUT AND WITH CONTRAST TECHNIQUE: Multidetector CT imaging of the abdomen and pelvis was performed using the standard protocol during bolus administration of intravenous contrast. Multiplanar reconstructed images and MIPs were obtained and reviewed to evaluate  the vascular anatomy. CONTRAST:  156mL  ISOVUE-370 IOPAMIDOL (ISOVUE-370) INJECTION 76% COMPARISON:  Abdominal CT dated 02/07/2017 FINDINGS: VASCULAR Aorta: Mild atherosclerotic calcification. No aneurysmal dilatation or dissection. Celiac: Patent without evidence of aneurysm, dissection, vasculitis or significant stenosis. SMA: Patent without evidence of aneurysm, dissection, vasculitis or significant stenosis. Renals: Both renal arteries are patent without evidence of aneurysm, dissection, vasculitis, fibromuscular dysplasia or significant stenosis. IMA: Patent without evidence of aneurysm, dissection, vasculitis or significant stenosis. Inflow: Mild atherosclerotic calcification of the iliac vessels. No aneurysmal dilatation or dissection. The iliac arteries are patent bilaterally. Proximal Outflow: Bilateral common femoral and visualized portions of the superficial and profunda femoral arteries are patent without evidence of aneurysm, dissection, vasculitis or significant stenosis. Veins: Extensive near occlusive thrombus extending from the visualized portions of the common femoral veins, external and common iliac veins and IVC to the level of the IVC filter. There is small amount of contrast in the peripheral lumen of these vessels. The suprarenal IVC filled with contrast and appears patent. No definite thrombus identified above the level of the IVC filter. The SMV, the SMV, and main portal vein are patent. 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 No portal venous gas. Review of the MIP images confirms the above findings. NON-VASCULAR Lower chest: Multiple bilateral pulmonary nodules most consistent with metastatic disease. An infectious process or septic emboli are less likely. Clinical correlation is recommended. Partially visualized area of lower attenuation in the right lower lobe/infrahilar vascular confluence (series 6, image 5). This is incompletely evaluated and may represent volume averaging  artifact with perihilar fat or scarring. Acute pulmonary embolus is not entirely excluded. Clinical correlation is recommended. If there is clinical concern for acute PE further evaluation with CTA of the chest or V/Q scan recommended. There is no intra-abdominal free air. Small upper abdominal ascites as well as small amount of fluid within the pelvis. Hepatobiliary: An ill-defined focal area of heterogeneous enhancement is seen in the left lobe of the liver (series 6, image 23). The liver is otherwise unremarkable. No intrahepatic biliary ductal dilatation. Cholecystectomy. Pancreas: There is fatty infiltration of the uncinate and head of the pancreas. There is prominence of the soft tissues of the distal body and tail of the pancreas which may represent extension of the previously seen pancreatic mass or pancreatitis. Correlation with pancreatic enzymes recommended. Spleen: Multiple small splenic hypodense lesions appear new or more prominent compared to the prior CT and concerning for extension of pancreatic neoplasm. Areas of infarct is less likely but not excluded. Adrenals/Urinary Tract: The adrenal glands are unremarkable. The kidneys, and the visualized ureters appear unremarkable as well. The urinary bladder is predominantly collapsed. There is apparent diffuse thickening of the bladder wall which may be partly related to underdistention. Cystitis is not excluded. Correlation with urinalysis recommended. Stomach/Bowel: Postsurgical changes of bowel with anastomotic suture in the sigmoid colon. There is sigmoid diverticulosis without active inflammatory changes. Moderate amount of stool noted throughout the colon no bowel obstruction. Normal appendix. Lymphatic: Mildly enlarged lymph node in the porta hepaticas measuring 14 mm in short axis. Multiple mildly rounded small retroperitoneal and para-aortic lymph nodes. Reproductive: Hysterectomy. Other: Extensive omental implants and nodularity, new compared to  prior CT and consistent with omental caking. Musculoskeletal: Degenerative changes of the spine. No acute osseous pathology. IMPRESSION: VASCULAR 1. Extensive near complete occlusive thrombus extending from the common femoral veins to the IVC at the level of the IVC filter. No definite thrombus noted  above the IVC. 2. Partially visualized small low-attenuation in the right lower lobe/right infrahilar pulmonary artery confluence may represent perivascular hilar fat or scarring. Acute PE is not entirely excluded. CTA of the chest or V/Q scan may provide better evaluation if there is clinical concern for acute PE. 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 NON-VASCULAR 1. Enlargement of the distal body and tail of the pancreas likely from extension of the previously seen pancreatic mass. 2. Metastatic disease including omental implant, splenic lesions, and pulmonary nodules. Small ascites, likely malignant. Further evaluation with PET-CT and oncological consult is advised. 3. Mildly enlarged porta hepatic lymph node as well as slightly rounded retroperitoneal and para-aortic lymph nodes. 4. Focal ill-defined heterogeneous enhancement of the left lobe of the liver. These results were called by telephone at the time of interpretation on 10/16/2017 at 1:28 am to Dr. Jannifer Franklin, who verbally acknowledged these results. Electronically Signed   By: Anner Crete M.D.   On: 10/16/2017 01:29    EKG:   Orders placed or performed during the hospital encounter of 09/07/17  . EKG 12-Lead  . EKG 12-Lead  . ED EKG within 10 minutes  . ED EKG within 10 minutes  . EKG    ASSESSMENT AND PLAN:        Epigastric abdominal pain with symptomatic anemia - but history of hemorrhagic gastritis per recent EGD  Status post 2 units PRBC transfusion Hb at 8.8 today GI is considering EGD, Dr. Alice Reichert will see the patient    Multiple lesions of metastatic malignancy [facility]) -Patient had CT abdomen and pelvis about 8 months ago  which showed a suspicious pancreatic lesion.  On chart review, patient had follow-up MRI about 7 months ago at [facility] which indicated the pancreatic lesion was likely inflammatory in nature.   Seen by  oncology Dr.Yu , recommending biopsy     Diabetes ([place]) -sliding scale insulin with corresponding glucose checks    Deep vein thrombosis (DVT) of right lower extremity ([place]) -CT imaging  indicated that her right lower extremity DVT is significantly larger than previously seen.  Discussed with vascular surgery Dr. Lucky Cowboy, not considering any interventions at this time  Possible pulmonary embolism per CT angiogram of the abdomen VQ scan ordered Not considering any anticoagulation though VQ scan turns out positive for pulmonary embolism in view of acute anemia possible underlying metastatic malignancy, patient was bleeding on Xarelto in the past    Hypothyroidism -Synthyroid     All the records are reviewed and case discussed with Care Management/Social Workerr. Management plans discussed with the patient, she is in agreement.  CODE STATUS: FC   TOTAL TIME TAKING CARE OF THIS PATIENT: 41 minutes.   POSSIBLE D/C IN 2-3 DAYS, DEPENDING ON CLINICAL CONDITION.  Note: This dictation was prepared with Dragon dictation along with smaller phrase technology. Any transcriptional errors that result from this process are unintentional.   Nicholes Mango M.D on 10/16/2017 at 3:12 PM  Between 7am to 6pm - Pager - [phone] After 6pm go to [email or web address] - password [email or web address] [facility]  [facility]  Office  [phone]  CC: Primary care physician; Lorelee Market, MD

## 2017-10-16 NOTE — Progress Notes (Signed)
.  Family Meeting Note  Advance Directive:yes  Today a meeting took place with the Patient.    The following clinical team members were present during this meeting:MD ,RN Vicente Males  The following were discussed:Patient's diagnosis: Symptomatic anemia, history of hemorrhagic gastritis, near total occlusion of the thrombus below IVC and also the level of IVC, pancreatic mass with possible metastases  patient's progosis: Unable to determine and Goals for treatment: Full Code, husband is  Healthcare power of attorney  Additional follow-up to be provided: Hospitalist, gastroenterology, oncology, vascular surgery  Time spent during discussion:18 min  Ann Mango, MD

## 2017-10-16 NOTE — ED Notes (Signed)
Pt currently in CT, Pt will be transported back to ed per admitting MD request for admitting provider to examine pt, then pt will be transported to the floor

## 2017-10-16 NOTE — Telephone Encounter (Signed)
Patient had to go to ER with a GI bleed. Dr wants to do a EGD and wants to know if this is something Dr Allen Norris was planning on doing. Please call

## 2017-10-16 NOTE — Consult Note (Addendum)
Hematology/Oncology Consult note [facility] Telephone:([phone] [phone]  Patient Care Team: Lorelee Market, MD as PCP - General (Family Medicine)   Name of the patient: Ann Larson  [id number]  [birth year]   Date of visit: 10/16/17 REASON FOR COSULTATION:  Possible pancreatic cancer on images. History of presenting illness-  This is a 62 year old female with history of recent right lower extremity DVT, GI bleeding due to anticoagulation, status post IVC filter who presents to emergency room with episode of epigastric abdominal pain, shortness of breath, fatigue, and near syncope.  Hemoglobin was found to be 6.6.  Denies seeing blood in the stool or having black stool. She developed right lower extremity occlusive cough and popliteal DVT extending into the distal femoral vein on 08/29/2017.  She was started on Xarelto and had subsequent GI bleeding.  On 1/ 28/2019 she presented with symptomatic anemia, GI bleeding.  She had another follow-up US venous lower extremity done,which showed propagation of deep vein thrombosis of the right lower extremity  now involving right common femoral and femoral veins in addition to previously identified right popliteal and calf veins.  She was evaluated by GI and EGD showed gastritis with hemorrhage, treated with argon plasma coagulation.  Anticoagulation was held and patient had IVC filter placed.   09/23/17 She had a repeat right lower extremity venous Doppler done which showed occlusive DVT of the right common femoral, superficial femoral, profunda femoral, popliteal, posterior tibial  Veins.  Patient continues to have right thigh pain and discomfort and abdominal pain which triggered a CT abdomen pelvis angiogram which showed extensive near complete occlusive thrombus extending from the common femoral vein to the IVC at the level of IVC filter, no definite thrombus noted above I VC, enlarged distal body and tail of  the pancreas likely from extension of the previously seen pancreatic mass, metastatic disease including omental implant, splenic lesions, pulmonary nodules.  Small a situs.  Mildly enlarged porta hepatic lymph node as well as slightly rounded retroperitoneal and periaortic lymph nodes.  Focal ill-defined heterogeneous enhancement of the left lobe of the liver.  Patient reports feeling slightly better after getting blood transfusion.  She received 2 PRBC transfusion since admission.  Denies any chest pain, shortness of breath.  She still has some abdominal discomfort.  She tells me that she has lost appetite and lost 20 pounds for the past month. Review of systems- Review of Systems  Constitutional: Positive for malaise/fatigue and weight loss. Negative for chills and fever.  HENT: Negative for nosebleeds.   Eyes: Negative for double vision.  Respiratory: Negative for cough and sputum production.   Cardiovascular: Negative for chest pain and orthopnea.  Gastrointestinal: Positive for abdominal pain. Negative for blood in stool, constipation, diarrhea, nausea and vomiting.  Genitourinary: Negative for dysuria.  Musculoskeletal: Negative for myalgias and neck pain.  Skin: Negative for rash.  Neurological: Positive for weakness. Negative for dizziness, tingling and sensory change.  Psychiatric/Behavioral: The patient is nervous/anxious.     Allergies  Allergen Reactions  . Sulfa Antibiotics     Patient Active Problem List   Diagnosis Date Noted  . Multiple lesions of metastatic malignancy ([place]) 10/16/2017  . Hypothyroidism 10/15/2017  . Diabetes ([place]) 10/15/2017  . Hypertension 10/05/2017  . Deep vein thrombosis (DVT) of right lower extremity ([place]) 09/26/2017  . Personal history of peptic ulcer disease   . Gastritis with hemorrhage   . Symptomatic anemia   . Heme + stool   . Blood  in stool   . GI bleed 09/07/2017     Past Medical History:  Diagnosis Date  . Depression   .  Diabetes mellitus without complication ([place])   . Diverticulitis   . DVT (deep vein thrombosis) in pregnancy ([place]) 08/29/2017   Right popliteal and femoral  . GERD (gastroesophageal reflux disease)   . Hyperlipidemia   . Hypertension   . Hypothyroidism   . Obesity      Past Surgical History:  Procedure Laterality Date  . ABDOMINAL HYSTERECTOMY  2005  . CHOLECYSTECTOMY  2015  . COLONOSCOPY WITH PROPOFOL N/A 07/01/2015   Procedure: COLONOSCOPY WITH PROPOFOL;  Surgeon: Josefine Class, MD;  Location: [facility] ENDOSCOPY;  Service: Endoscopy;  Laterality: N/A;  . ESOPHAGOGASTRODUODENOSCOPY (EGD) WITH PROPOFOL N/A 07/01/2015   Procedure: ESOPHAGOGASTRODUODENOSCOPY (EGD) WITH PROPOFOL;  Surgeon: Josefine Class, MD;  Location: [facility] ENDOSCOPY;  Service: Endoscopy;  Laterality: N/A;  . ESOPHAGOGASTRODUODENOSCOPY (EGD) WITH PROPOFOL N/A 09/08/2017   Procedure: ESOPHAGOGASTRODUODENOSCOPY (EGD) WITH PROPOFOL;  Surgeon: Lucilla Lame, MD;  Location: [facility] ENDOSCOPY;  Service: Endoscopy;  Laterality: N/A;  . ESOPHAGOGASTRODUODENOSCOPY (EGD) WITH PROPOFOL N/A 09/21/2017   Procedure: ESOPHAGOGASTRODUODENOSCOPY (EGD) WITH PROPOFOL;  Surgeon: Lucilla Lame, MD;  Location: [facility] ENDOSCOPY;  Service: Endoscopy;  Laterality: N/A;  . IVC FILTER INSERTION N/A 09/08/2017   Procedure: IVC FILTER INSERTION;  Surgeon: Algernon Huxley, MD;  Location: [place] [facility];  Service: Cardiovascular;  Laterality: N/A;    Social History   Socioeconomic History  . Marital status: Married    Spouse name: Not on file  . Number of children: Not on file  . Years of education: Not on file  . Highest education level: Not on file  Social Needs  . Financial resource strain: Not on file  . Food insecurity - worry: Not on file  . Food insecurity - inability: Not on file  . Transportation needs - medical: Not on file  . Transportation needs - non-medical: Not on file  Occupational History  . Not on file  Tobacco Use  .  Smoking status: Never Smoker  . Smokeless tobacco: Never Used  Substance and Sexual Activity  . Alcohol use: No  . Drug use: No  . Sexual activity: Not on file  Other Topics Concern  . Not on file  Social History Narrative  . Not on file     Family History  Problem Relation Age of Onset  . Deep vein thrombosis Neg Hx      Current Facility-Administered Medications:  .  0.9 %  sodium chloride infusion, , Intravenous, Continuous, Lance Coon, MD, Last Rate: 100 mL/hr at 10/16/17 0204 .  acetaminophen (TYLENOL) tablet 650 mg, 650 mg, Oral, Q6H PRN **OR** acetaminophen (TYLENOL) suppository 650 mg, 650 mg, Rectal, Q6H PRN, Lance Coon, MD .  insulin aspart (novoLOG) injection 0-9 Units, 0-9 Units, Subcutaneous, Q6H, Lance Coon, MD, 2 Units at 10/16/17 [phone] .  levothyroxine (SYNTHROID, LEVOTHROID) tablet 125 mcg, 125 mcg, Oral, QAC breakfast, Lance Coon, MD, Stopped at 10/16/17 [phone] .  methocarbamol (ROBAXIN) 500 mg in dextrose 5 % 50 mL IVPB, 500 mg, Intravenous, Q6H PRN, Lance Coon, MD .  morphine 2 MG/ML injection 2 mg, 2 mg, Intravenous, Q4H PRN, Lance Coon, MD, 2 mg at 10/16/17 0427 .  ondansetron (ZOFRAN) tablet 4 mg, 4 mg, Oral, Q6H PRN **OR** ondansetron (ZOFRAN) injection 4 mg, 4 mg, Intravenous, Q6H PRN, Lance Coon, MD .  pantoprazole (PROTONIX) 80 mg in sodium chloride 0.9 % 250 mL (  0.32 mg/mL) infusion, 8 mg/hr, Intravenous, Continuous, Lance Coon, MD, Last Rate: 25 mL/hr at 10/16/17 0258, 8 mg/hr at 10/16/17 0258 .  [START ON 10/19/2017] pantoprazole (PROTONIX) injection 40 mg, 40 mg, Intravenous, Q12H, Lance Coon, MD .  simvastatin (ZOCOR) tablet 20 mg, 20 mg, Oral, Daily, Lance Coon, MD .  venlafaxine XR [facility]) 24 hr capsule 75 mg, 75 mg, Oral, Q breakfast, Lance Coon, MD, Stopped at 10/16/17 8453   Physical exam:  Vitals:   10/16/17 0609 10/16/17 0632 10/16/17 0726 10/16/17 0814  BP: (!) 109/49 116/60 (!) 116/59 (!) 123/58  Pulse: 99 95  96 97  Resp: (!) 28 18 18 18   Temp: 98.4 F (36.9 C) 99.1 F (37.3 C) 98.6 F (37 C) 98.4 F (36.9 C)  TempSrc: Oral Oral Oral Oral  SpO2: 93% 96% 95% 93%  Weight:      Height:       GENERAL:Alert, no distress and comfortable.  EYES: +  Pallor, non icterus OROPHARYNX: no thrush or ulceration; good dentition  NECK: supple, no masses felt LUNGS: clear to auscultation and  No wheeze or crackles HEART/CVS: regular rate & rhythm and no murmurs; No lower extremity edema ABDOMEN: abdomen soft, she feels diffuse tenderness with palpation,  Has normal bowel sounds Musculoskeletal:no cyanosis of digits and no clubbing  PSYCH: alert & oriented x 3  NEURO: no focal motor/sensory deficits SKIN:  no rashes or significant lesions     CMP Latest Ref Rng & Units 10/15/2017  Glucose 65 - 99 mg/dL 241(H)  BUN 6 - 20 mg/dL 18  Creatinine 0.44 - 1.00 mg/dL 1.08(H)  Sodium 135 - 145 mmol/L 134(L)  Potassium 3.5 - 5.1 mmol/L 4.1  Chloride 101 - 111 mmol/L 102  CO2 22 - 32 mmol/L 24  Calcium 8.9 - 10.3 mg/dL 10.2  Total Protein 6.5 - 8.1 g/dL 7.6  Total Bilirubin 0.3 - 1.2 mg/dL 0.2(L)  Alkaline Phos 38 - 126 U/L 85  AST 15 - 41 U/L 33  ALT 14 - 54 U/L 20   CBC Latest Ref Rng & Units 10/15/2017  WBC 3.6 - 11.0 K/uL 11.5(H)  Hemoglobin 12.0 - 16.0 g/dL 6.6(L)  Hematocrit 35.0 - 47.0 % 21.6(L)  Platelets 150 - 440 K/uL 233    Dg Chest 1 View  Result Date: 10/15/2017 CLINICAL DATA:  Upper abdominal pain EXAM: CHEST 1 VIEW COMPARISON:  09/07/2016 FINDINGS: The heart size and mediastinal contours are within normal limits. Both lungs are clear. The visualized skeletal structures are unremarkable. IMPRESSION: No active disease. Electronically Signed   By: Donavan Foil M.D.   On: 10/15/2017 22:48   US Venous Img Lower Unilateral Right  Result Date: 09/23/2017 CLINICAL DATA:  Right lower extremity pain and swelling. EXAM: Right LOWER EXTREMITY VENOUS DOPPLER ULTRASOUND TECHNIQUE: Gray-scale  sonography with graded compression, as well as color Doppler and duplex ultrasound were performed to evaluate the lower extremity deep venous systems from the level of the common femoral vein and including the common femoral, femoral, profunda femoral, popliteal and calf veins including the posterior tibial, peroneal and gastrocnemius veins when visible. The superficial great saphenous vein was also interrogated. Spectral Doppler was utilized to evaluate flow at rest and with distal augmentation maneuvers in the common femoral, femoral and popliteal veins. COMPARISON:  Ultrasound of September 17, 2017. FINDINGS: Contralateral Common Femoral Vein: Respiratory phasicity is normal and symmetric with the symptomatic side. No evidence of thrombus. Normal compressibility. Common Femoral Vein: Noncompressible with no flow consistent  with occlusive thrombus. Saphenofemoral Junction: Noncompressible with no flow consistent with occlusive thrombus. Profunda Femoral Vein: Noncompressible with no flow consistent with occlusive thrombus. Femoral Vein: Noncompressible with no flow consistent with occlusive thrombus. Popliteal Vein: Noncompressible with no flow consistent with occlusive thrombus. Calf Veins: Posterior tibial and peroneal veins are noncompressible with no flow consistent with occlusive thrombus. Venous Reflux:  None. Other Findings:  None. IMPRESSION: There is again noted occlusive deep venous thrombosis of the right common femoral, superficial femoral, profunda femoral, popliteal, posterior tibial and peroneal veins. Electronically Signed   By: Marijo Conception, M.D.   On: 09/23/2017 02:37   US Venous Img Lower Unilateral Right  Result Date: 09/17/2017 CLINICAL DATA:  Increased swelling and pain in RIGHT lower extremity, known DVT, prior IVC filter placement EXAM: RIGHT LOWER EXTREMITY VENOUS DOPPLER ULTRASOUND TECHNIQUE: Gray-scale sonography with graded compression, as well as color Doppler and duplex  ultrasound were performed to evaluate the lower extremity deep venous systems from the level of the common femoral vein and including the common femoral, femoral, profunda femoral, popliteal and calf veins including the posterior tibial, peroneal and gastrocnemius veins when visible. The superficial great saphenous vein was also interrogated. Spectral Doppler was utilized to evaluate flow at rest and with distal augmentation maneuvers in the common femoral, femoral and popliteal veins. COMPARISON:  08/29/2017 FINDINGS: Contralateral Common Femoral Vein: Respiratory phasicity is normal and symmetric with the symptomatic side. No evidence of thrombus. Normal compressibility. Common Femoral Vein: Acute appearing hypoechoic nonocclusive thrombus new since prior exam. Impaired compressibility and spontaneous venous flow. Saphenofemoral Junction: Nonocclusive thrombus, new since prior exam Profunda Femoral Vein: No evidence of thrombus. Normal compressibility. Femoral Vein: Occlusive thrombus with impaired compressibility, new since prior exam Popliteal Vein: Occlusive thrombus with impaired compressibility Calf Veins: Occlusive thrombus with impaired compressibility Superficial Great Saphenous Vein: No evidence of thrombus. Normal compressibility. Venous Reflux:  None. Other Findings:  None. IMPRESSION: Propagation of deep venous thrombosis of the RIGHT lower extremity now involving the RIGHT common femoral and femoral veins in addition to previously identified RIGHT popliteal and calf veins. Electronically Signed   By: Lavonia Dana M.D.   On: 09/17/2017 17:57   Ct Angio Abd/pel W And/or Wo Contrast  Result Date: 10/16/2017 CLINICAL DATA:  62 year old female with history of pancreatitis presents with leg and abdominal pain. EXAM: CTA ABDOMEN AND PELVIS wITHOUT AND WITH CONTRAST TECHNIQUE: Multidetector CT imaging of the abdomen and pelvis was performed using the standard protocol during bolus administration of  intravenous contrast. Multiplanar reconstructed images and MIPs were obtained and reviewed to evaluate the vascular anatomy. CONTRAST:  154mL ISOVUE-370 IOPAMIDOL (ISOVUE-370) INJECTION 76% COMPARISON:  Abdominal CT dated 02/07/2017 FINDINGS: VASCULAR Aorta: Mild atherosclerotic calcification. No aneurysmal dilatation or dissection. Celiac: Patent without evidence of aneurysm, dissection, vasculitis or significant stenosis. SMA: Patent without evidence of aneurysm, dissection, vasculitis or significant stenosis. Renals: Both renal arteries are patent without evidence of aneurysm, dissection, vasculitis, fibromuscular dysplasia or significant stenosis. IMA: Patent without evidence of aneurysm, dissection, vasculitis or significant stenosis. Inflow: Mild atherosclerotic calcification of the iliac vessels. No aneurysmal dilatation or dissection. The iliac arteries are patent bilaterally. Proximal Outflow: Bilateral common femoral and visualized portions of the superficial and profunda femoral arteries are patent without evidence of aneurysm, dissection, vasculitis or significant stenosis. Veins: Extensive near occlusive thrombus extending from the visualized portions of the common femoral veins, external and common iliac veins and IVC to the level of the IVC filter. There is small amount  of contrast in the peripheral lumen of these vessels. The suprarenal IVC filled with contrast and appears patent. No definite thrombus identified above the level of the IVC filter. The SMV, the SMV, and main portal vein are patent. 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 No portal venous gas. Review of the MIP images confirms the above findings. NON-VASCULAR Lower chest: Multiple bilateral pulmonary nodules most consistent with metastatic disease. An infectious process or septic emboli are less likely. Clinical correlation is recommended. Partially visualized area of lower attenuation in the  right lower lobe/infrahilar vascular confluence (series 6, image 5). This is incompletely evaluated and may represent volume averaging artifact with perihilar fat or scarring. Acute pulmonary embolus is not entirely excluded. Clinical correlation is recommended. If there is clinical concern for acute PE further evaluation with CTA of the chest or V/Q scan recommended. There is no intra-abdominal free air. Small upper abdominal ascites as well as small amount of fluid within the pelvis. Hepatobiliary: An ill-defined focal area of heterogeneous enhancement is seen in the left lobe of the liver (series 6, image 23). The liver is otherwise unremarkable. No intrahepatic biliary ductal dilatation. Cholecystectomy. Pancreas: There is fatty infiltration of the uncinate and head of the pancreas. There is prominence of the soft tissues of the distal body and tail of the pancreas which may represent extension of the previously seen pancreatic mass or pancreatitis. Correlation with pancreatic enzymes recommended. Spleen: Multiple small splenic hypodense lesions appear new or more prominent compared to the prior CT and concerning for extension of pancreatic neoplasm. Areas of infarct is less likely but not excluded. Adrenals/Urinary Tract: The adrenal glands are unremarkable. The kidneys, and the visualized ureters appear unremarkable as well. The urinary bladder is predominantly collapsed. There is apparent diffuse thickening of the bladder wall which may be partly related to underdistention. Cystitis is not excluded. Correlation with urinalysis recommended. Stomach/Bowel: Postsurgical changes of bowel with anastomotic suture in the sigmoid colon. There is sigmoid diverticulosis without active inflammatory changes. Moderate amount of stool noted throughout the colon no bowel obstruction. Normal appendix. Lymphatic: Mildly enlarged lymph node in the porta hepaticas measuring 14 mm in short axis. Multiple mildly rounded small  retroperitoneal and para-aortic lymph nodes. Reproductive: Hysterectomy. Other: Extensive omental implants and nodularity, new compared to prior CT and consistent with omental caking. Musculoskeletal: Degenerative changes of the spine. No acute osseous pathology. IMPRESSION: VASCULAR 1. Extensive near complete occlusive thrombus extending from the common femoral veins to the IVC at the level of the IVC filter. No definite thrombus noted above the IVC. 2. Partially visualized small low-attenuation in the right lower lobe/right infrahilar pulmonary artery confluence may represent perivascular hilar fat or scarring. Acute PE is not entirely excluded. CTA of the chest or V/Q scan may provide better evaluation if there is clinical concern for acute PE. 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 NON-VASCULAR 1. Enlargement of the distal body and tail of the pancreas likely from extension of the previously seen pancreatic mass. 2. Metastatic disease including omental implant, splenic lesions, and pulmonary nodules. Small ascites, likely malignant. Further evaluation with PET-CT and oncological consult is advised. 3. Mildly enlarged porta hepatic lymph node as well as slightly rounded retroperitoneal and para-aortic lymph nodes. 4. Focal ill-defined heterogeneous enhancement of the left lobe of the liver. These results were called by telephone at the time of interpretation on 10/16/2017 at 1:28 am to Dr. Jannifer Franklin, who verbally acknowledged these results. Electronically Signed  By: Anner Crete M.D.   On: 10/16/2017 01:29    Assessment and plan- Patient is a 62 y.o. female with history of hemorraghic gastritis, extensive bilateral lower extremity DVT, presents with symptomatic anemia, hemoglobin 6.6, abdominal pain with abnormal CT findings.   # Likely metastatic disease, possible pancrease origin, involving lung, liver, omental caking.  Discussed with patient that she needs biopsy for tissue diagnosis.  Add tumor  marker CA 19.9 to labs.  Outpatient PET scan.   # symptomatic anemia, agree with PRBC transfusion to keep Hemoglobin above 7. I have added iron panels, if indicated, will recommend IV iron infusion to further support her.   # Possible blood loss form GI tract, appreciate GI recommendation for further EGD evaluation. If she can remain a stable acceptable hemoglobin, she will be in a better condition for future antineoplasm treatments.   I will check PT, PTT in AM.   # Extensive bilateral  lower extremity DVT: anticoagulation is contraindicated at this point due to ongoing blood loss. DVT maybe related to her metastatic tumor burden. Appreciate vascular surgeon recommendation to see if intervention is indicated.   Dr.Guru, thank you for this kind referral and the opportunity to participate in the care of this patient. I will follow along her inpatient course and see her outpatient to discuss further treatment plan once pathology is back.   Earlie Server, MD, PhD Hematology Oncology [facility] at [facility] Pager- [phone] 10/16/2017

## 2017-10-16 NOTE — Progress Notes (Signed)
Pt requesting to have continuous pulse ox removed. MD Gouru notified. Orders received to d/c.

## 2017-10-17 ENCOUNTER — Inpatient Hospital Stay: Payer: BLUE CROSS/BLUE SHIELD

## 2017-10-17 DIAGNOSIS — R600 Localized edema: Secondary | ICD-10-CM

## 2017-10-17 DIAGNOSIS — K922 Gastrointestinal hemorrhage, unspecified: Secondary | ICD-10-CM

## 2017-10-17 DIAGNOSIS — D649 Anemia, unspecified: Secondary | ICD-10-CM

## 2017-10-17 DIAGNOSIS — I82409 Acute embolism and thrombosis of unspecified deep veins of unspecified lower extremity: Secondary | ICD-10-CM

## 2017-10-17 LAB — CBC
HEMATOCRIT: 27.4 % — AB (ref 35.0–47.0)
Hemoglobin: 8.8 g/dL — ABNORMAL LOW (ref 12.0–16.0)
MCH: 28.4 pg (ref 26.0–34.0)
MCHC: 32 g/dL (ref 32.0–36.0)
MCV: 88.6 fL (ref 80.0–100.0)
PLATELETS: 194 10*3/uL (ref 150–440)
RBC: 3.09 MIL/uL — AB (ref 3.80–5.20)
RDW: 17.1 % — AB (ref 11.5–14.5)
WBC: 11.3 10*3/uL — ABNORMAL HIGH (ref 3.6–11.0)

## 2017-10-17 LAB — TYPE AND SCREEN
ABO/RH(D): O POS
ANTIBODY SCREEN: NEGATIVE
UNIT DIVISION: 0
UNIT DIVISION: 0

## 2017-10-17 LAB — BPAM RBC
BLOOD PRODUCT EXPIRATION DATE: 201903182359
Blood Product Expiration Date: 201903142359
ISSUE DATE / TIME: 201902260119
ISSUE DATE / TIME: 201902260601
UNIT TYPE AND RH: 5100
Unit Type and Rh: 5100

## 2017-10-17 LAB — GLUCOSE, CAPILLARY
GLUCOSE-CAPILLARY: 194 mg/dL — AB (ref 65–99)
GLUCOSE-CAPILLARY: 178 mg/dL — AB (ref 65–99)
GLUCOSE-CAPILLARY: 178 mg/dL — AB (ref 65–99)
Glucose-Capillary: 175 mg/dL — ABNORMAL HIGH (ref 65–99)

## 2017-10-17 LAB — APTT: aPTT: 38 seconds — ABNORMAL HIGH (ref 24–36)

## 2017-10-17 LAB — BASIC METABOLIC PANEL
Anion gap: 11 (ref 5–15)
BUN: 25 mg/dL — AB (ref 6–20)
CO2: 20 mmol/L — AB (ref 22–32)
Calcium: 11.1 mg/dL — ABNORMAL HIGH (ref 8.9–10.3)
Chloride: 106 mmol/L (ref 101–111)
Creatinine, Ser: 1.04 mg/dL — ABNORMAL HIGH (ref 0.44–1.00)
GFR calc Af Amer: >60 mL/min (ref >60)
GFR, EST NON AFRICAN AMERICAN: 57 mL/min — AB (ref >60)
GLUCOSE: 190 mg/dL — AB (ref 65–99)
POTASSIUM: 4.4 mmol/L (ref 3.5–5.1)
Sodium: 137 mmol/L (ref 135–145)

## 2017-10-17 LAB — PROTIME-INR
INR: 1.65
PROTHROMBIN TIME: 19.4 s — AB (ref 11.4–15.2)

## 2017-10-17 LAB — CANCER ANTIGEN 19-9: CAN 19-9: 124491 U/mL — AB (ref 0–35)

## 2017-10-17 LAB — LACTATE DEHYDROGENASE: LDH: 200 U/L — ABNORMAL HIGH (ref 98–192)

## 2017-10-17 MED ORDER — LIDOCAINE HCL (PF) 1 % IJ SOLN
INTRAMUSCULAR | Status: AC | PRN
  Administered 2017-10-17: 10 mL

## 2017-10-17 MED ORDER — HYDROMORPHONE HCL 1 MG/ML PO LIQD: 1.0000 mg | Freq: Four times a day (QID) | ORAL | Status: DC | PRN

## 2017-10-17 MED ORDER — SODIUM CHLORIDE 0.9 % IV SOLN
INTRAVENOUS | Status: AC | PRN
  Administered 2017-10-17: 10 mL/h via INTRAVENOUS

## 2017-10-17 MED ORDER — GLUCERNA SHAKE PO LIQD
237.0000 mL | Freq: Two times a day (BID) | ORAL | Status: DC
  Administered 2017-10-20 – 2017-10-22 (×5): 237 mL via ORAL

## 2017-10-17 MED ORDER — HYDROMORPHONE HCL 2 MG PO TABS
1.0000 mg | ORAL_TABLET | Freq: Four times a day (QID) | ORAL | Status: DC | PRN
  Administered 2017-10-17 – 2017-10-18 (×3): 1 mg via ORAL
  Filled 2017-10-17 (×3): qty 1

## 2017-10-17 MED ORDER — PREMIER PROTEIN SHAKE
11.0000 [oz_av] | ORAL | Status: DC
  Administered 2017-10-21: 11 [oz_av] via ORAL

## 2017-10-17 MED ORDER — FENTANYL CITRATE (PF) 100 MCG/2ML IJ SOLN
INTRAMUSCULAR | Status: AC | PRN
  Administered 2017-10-17: 50 ug via INTRAVENOUS
  Administered 2017-10-17: 25 ug via INTRAVENOUS

## 2017-10-17 MED ORDER — MIDAZOLAM HCL 5 MG/5ML IJ SOLN
INTRAMUSCULAR | Status: AC | PRN
  Administered 2017-10-17 (×2): 1 mg via INTRAVENOUS

## 2017-10-17 MED ORDER — FENTANYL CITRATE (PF) 100 MCG/2ML IJ SOLN
INTRAMUSCULAR | Status: AC
  Filled 2017-10-17: qty 4

## 2017-10-17 MED ORDER — MIDAZOLAM HCL 5 MG/5ML IJ SOLN
INTRAMUSCULAR | Status: AC
  Filled 2017-10-17: qty 5

## 2017-10-17 MED ORDER — HYDROMORPHONE HCL 1 MG/ML IJ SOLN
1.0000 mg | Freq: Once | INTRAMUSCULAR | Status: AC
  Administered 2017-10-17: 1 mg via INTRAVENOUS
  Filled 2017-10-17: qty 1

## 2017-10-17 MED ORDER — SODIUM CHLORIDE 0.9 % IV SOLN
INTRAVENOUS | Status: DC
  Administered 2017-10-17: via INTRAVENOUS

## 2017-10-17 NOTE — Progress Notes (Signed)
[facility] NAME: Ann Larson    MR#:  [id number]  DATE OF BIRTH:  Mar 22, 1956  SUBJECTIVE:  CHIEF COMPLAINT: Patient's abdominal pain is resolved at this time, rt leg is very swollen and painful , had a EGD recently for a GI bleed by Dr. Allen Norris.  Denies any dizziness or chest pain. Agreeable with EGD and prefers to stick with dr.Wohls group  REVIEW OF SYSTEMS:  CONSTITUTIONAL: No fever, fatigue or weakness.  EYES: No blurred or double vision.  EARS, NOSE, AND THROAT: No tinnitus or ear pain.  RESPIRATORY: No cough, shortness of breath, wheezing or hemoptysis.  CARDIOVASCULAR: No chest pain, orthopnea, edema.  GASTROINTESTINAL: No nausea, vomiting, diarrhea or abdominal pain.  GENITOURINARY: No dysuria, hematuria.  ENDOCRINE: No polyuria, nocturia,  HEMATOLOGY: No anemia, easy bruising or bleeding SKIN: No rash or lesion. MUSCULOSKELETAL: No joint pain or arthritis.   NEUROLOGIC: No tingling, numbness, weakness.  PSYCHIATRY: No anxiety or depression.   DRUG ALLERGIES:   Allergies  Allergen Reactions  . Sulfa Antibiotics     VITALS:  Blood pressure (!) 125/57, pulse (!) 107, temperature 98.4 F (36.9 C), temperature source Oral, resp. rate 18, height 5\' 9"  (1.753 m), weight 105.7 kg (233 lb 0.4 oz), SpO2 95 %.  PHYSICAL EXAMINATION:  GENERAL:  62 y.o.-year-old patient lying in the bed with no acute distress.  EYES: Pupils equal, round, reactive to light and accommodation. No scleral icterus. Extraocular muscles intact.  HEENT: Head atraumatic, normocephalic. Oropharynx and nasopharynx clear.  NECK:  Supple, no jugular venous distention. No thyroid enlargement, no tenderness.  LUNGS: Normal breath sounds bilaterally, no wheezing, rales,rhonchi or crepitation. No use of accessory muscles of respiration.  CARDIOVASCULAR: S1, S2 normal. No murmurs, rubs, or gallops.  ABDOMEN: Soft, nontender, nondistended. Bowel  sounds present.  EXTREMITIES: No pedal edema, cyanosis, or clubbing.  NEUROLOGIC: Cranial nerves II through XII are intact. Muscle strength 5/5 in all extremities. Sensation intact. Gait not checked.  PSYCHIATRIC: The patient is alert and oriented x 3.  SKIN: No obvious rash, lesion, or ulcer.    LABORATORY PANEL:   CBC Recent Labs  Lab 10/17/17 0453  WBC 11.3*  HGB 8.8*  HCT 27.4*  PLT 194   ------------------------------------------------------------------------------------------------------------------  Chemistries  Recent Labs  Lab 10/15/17 2230  10/17/17 0453  NA 134*   < > 137  K 4.1   < > 4.4  CL 102   < > 106  CO2 24   < > 20*  GLUCOSE 241*   < > 190*  BUN 18   < > 25*  CREATININE 1.08*   < > 1.04*  CALCIUM 10.2   < > 11.1*  AST 33  --   --   ALT 20  --   --   ALKPHOS 85  --   --   BILITOT 0.2*  --   --    < > = values in this interval not displayed.   ------------------------------------------------------------------------------------------------------------------  Cardiac Enzymes Recent Labs  Lab 10/15/17 2230  TROPONINI <0.03   ------------------------------------------------------------------------------------------------------------------  RADIOLOGY:  Dg Chest 1 View  Result Date: 10/15/2017 CLINICAL DATA:  Upper abdominal pain EXAM: CHEST 1 VIEW COMPARISON:  09/07/2016 FINDINGS: The heart size and mediastinal contours are within normal limits. Both lungs are clear. The visualized skeletal structures are unremarkable. IMPRESSION: No active disease. Electronically Signed   By: Donavan Foil M.D.   On: 10/15/2017 22:48   Nm Pulmonary  Perf And Vent  Result Date: 10/16/2017 CLINICAL DATA:  Chest pain and shortness of breath, high clinical suspicion of pulmonary embolism EXAM: NUCLEAR MEDICINE VENTILATION - PERFUSION LUNG SCAN TECHNIQUE: Ventilation images were obtained in multiple projections using inhaled aerosol Tc-27m DTPA. Perfusion images were  obtained in multiple projections after intravenous injection of Tc-89m-MAA. RADIOPHARMACEUTICALS:  34.104 mCi of Tc-66m DTPA aerosol inhalation and 4.008 mCi Tc29m-MAA IV COMPARISON:  None Correlation: Chest radiograph 10/15/2017 FINDINGS: Ventilation: No focal ventilation defects. Minimal central airway deposition of aerosol. Small amount of swallowed aerosol within stomach. Perfusion: Single small subsegmental perfusion defect at if RIGHT upper lobe. No other definite focal perfusion abnormalities. Chest radiograph: Clear lungs.  Normal heart size. IMPRESSION: Very low probability for pulmonary embolism. Electronically Signed   By: Lavonia Dana M.D.   On: 10/16/2017 16:41   Ct Angio Abd/pel W And/or Wo Contrast  Result Date: 10/16/2017 CLINICAL DATA:  62 year old female with history of pancreatitis presents with leg and abdominal pain. EXAM: CTA ABDOMEN AND PELVIS wITHOUT AND WITH CONTRAST TECHNIQUE: Multidetector CT imaging of the abdomen and pelvis was performed using the standard protocol during bolus administration of intravenous contrast. Multiplanar reconstructed images and MIPs were obtained and reviewed to evaluate the vascular anatomy. CONTRAST:  137mL ISOVUE-370 IOPAMIDOL (ISOVUE-370) INJECTION 76% COMPARISON:  Abdominal CT dated 02/07/2017 FINDINGS: VASCULAR Aorta: Mild atherosclerotic calcification. No aneurysmal dilatation or dissection. Celiac: Patent without evidence of aneurysm, dissection, vasculitis or significant stenosis. SMA: Patent without evidence of aneurysm, dissection, vasculitis or significant stenosis. Renals: Both renal arteries are patent without evidence of aneurysm, dissection, vasculitis, fibromuscular dysplasia or significant stenosis. IMA: Patent without evidence of aneurysm, dissection, vasculitis or significant stenosis. Inflow: Mild atherosclerotic calcification of the iliac vessels. No aneurysmal dilatation or dissection. The iliac arteries are patent bilaterally. Proximal  Outflow: Bilateral common femoral and visualized portions of the superficial and profunda femoral arteries are patent without evidence of aneurysm, dissection, vasculitis or significant stenosis. Veins: Extensive near occlusive thrombus extending from the visualized portions of the common femoral veins, external and common iliac veins and IVC to the level of the IVC filter. There is small amount of contrast in the peripheral lumen of these vessels. The suprarenal IVC filled with contrast and appears patent. No definite thrombus identified above the level of the IVC filter. The SMV, the SMV, and main portal vein are patent. 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 No portal venous gas. Review of the MIP images confirms the above findings. NON-VASCULAR Lower chest: Multiple bilateral pulmonary nodules most consistent with metastatic disease. An infectious process or septic emboli are less likely. Clinical correlation is recommended. Partially visualized area of lower attenuation in the right lower lobe/infrahilar vascular confluence (series 6, image 5). This is incompletely evaluated and may represent volume averaging artifact with perihilar fat or scarring. Acute pulmonary embolus is not entirely excluded. Clinical correlation is recommended. If there is clinical concern for acute PE further evaluation with CTA of the chest or V/Q scan recommended. There is no intra-abdominal free air. Small upper abdominal ascites as well as small amount of fluid within the pelvis. Hepatobiliary: An ill-defined focal area of heterogeneous enhancement is seen in the left lobe of the liver (series 6, image 23). The liver is otherwise unremarkable. No intrahepatic biliary ductal dilatation. Cholecystectomy. Pancreas: There is fatty infiltration of the uncinate and head of the pancreas. There is prominence of the soft tissues of the distal body and tail of the pancreas  which may represent  extension of the previously seen pancreatic mass or pancreatitis. Correlation with pancreatic enzymes recommended. Spleen: Multiple small splenic hypodense lesions appear new or more prominent compared to the prior CT and concerning for extension of pancreatic neoplasm. Areas of infarct is less likely but not excluded. Adrenals/Urinary Tract: The adrenal glands are unremarkable. The kidneys, and the visualized ureters appear unremarkable as well. The urinary bladder is predominantly collapsed. There is apparent diffuse thickening of the bladder wall which may be partly related to underdistention. Cystitis is not excluded. Correlation with urinalysis recommended. Stomach/Bowel: Postsurgical changes of bowel with anastomotic suture in the sigmoid colon. There is sigmoid diverticulosis without active inflammatory changes. Moderate amount of stool noted throughout the colon no bowel obstruction. Normal appendix. Lymphatic: Mildly enlarged lymph node in the porta hepaticas measuring 14 mm in short axis. Multiple mildly rounded small retroperitoneal and para-aortic lymph nodes. Reproductive: Hysterectomy. Other: Extensive omental implants and nodularity, new compared to prior CT and consistent with omental caking. Musculoskeletal: Degenerative changes of the spine. No acute osseous pathology. IMPRESSION: VASCULAR 1. Extensive near complete occlusive thrombus extending from the common femoral veins to the IVC at the level of the IVC filter. No definite thrombus noted above the IVC. 2. Partially visualized small low-attenuation in the right lower lobe/right infrahilar pulmonary artery confluence may represent perivascular hilar fat or scarring. Acute PE is not entirely excluded. CTA of the chest or V/Q scan may provide better evaluation if there is clinical concern for acute PE. 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 NON-VASCULAR 1. Enlargement of the distal body and tail of the pancreas likely from extension of the  previously seen pancreatic mass. 2. Metastatic disease including omental implant, splenic lesions, and pulmonary nodules. Small ascites, likely malignant. Further evaluation with PET-CT and oncological consult is advised. 3. Mildly enlarged porta hepatic lymph node as well as slightly rounded retroperitoneal and para-aortic lymph nodes. 4. Focal ill-defined heterogeneous enhancement of the left lobe of the liver. These results were called by telephone at the time of interpretation on 10/16/2017 at 1:28 am to Dr. Jannifer Franklin, who verbally acknowledged these results. Electronically Signed   By: Anner Crete M.D.   On: 10/16/2017 01:29    EKG:   Orders placed or performed during the hospital encounter of 09/07/17  . EKG 12-Lead  . EKG 12-Lead  . ED EKG within 10 minutes  . ED EKG within 10 minutes  . EKG    ASSESSMENT AND PLAN:        Epigastric abdominal pain with symptomatic anemia - but history of hemorrhagic gastritis per recent EGD  Status post 2 units PRBC transfusion Hb at 8.8 today GI is considering EGD, seen by dr.[place], pt prefers to get it done by dr.Wohls gr    Multiple lesions of metastatic malignancy ([place]) -Patient had CT abdomen and pelvis about 8 months ago which showed a suspicious pancreatic lesion.  On chart review, patient had follow-up MRI about 7 months ago at [facility] which indicated the pancreatic lesion was likely inflammatory in nature.   Seen by  oncology Dr.Yu , recommending biopsy, scheduled today     Diabetes ([place]) -sliding scale insulin with corresponding glucose checks    Deep vein thrombosis (DVT) of right lower extremity ([facility]) - swelling is worseCT imaging  indicated that her right lower extremity DVT is significantly larger than previously seen. Pt prefers to get RLE venous dopplers Discussed with vascular surgery Dr. Lucky Cowboy, not considering any interventions at this  time  Possible pulmonary embolism per CT angiogram of the abdomen VQ scan - low  probability  , patient was bleeding on Xarelto in the past    Hypothyroidism -Synthyroid     All the records are reviewed and case discussed with Care Management/Social Workerr. Management plans discussed with the patient, daughter , they  are  in agreement.  CODE STATUS: FC   TOTAL TIME TAKING CARE OF THIS PATIENT: 41 minutes.   POSSIBLE D/C IN 2-3 DAYS, DEPENDING ON CLINICAL CONDITION.  Note: This dictation was prepared with Dragon dictation along with smaller phrase technology. Any transcriptional errors that result from this process are unintentional.   Nicholes Mango M.D on 10/17/2017 at 8:57 AM  Between 7am to 6pm - Pager - [phone] After 6pm go to [email or web address] - password [email or web address] [facility]  [facility]  Office  [phone]  CC: Primary care physician; Lorelee Market, MD

## 2017-10-17 NOTE — Progress Notes (Signed)
Pt brought to special recovery to await procedure per radiology nurse request, charge nurse aware. Pt lying comfortably in bed in NAD.

## 2017-10-17 NOTE — Progress Notes (Signed)
Ann Larson , MD [address] [place], [place], [place], [address] [address], [place], [place], [place], [address] Phone: [phone]  Fax: [phone]   Ann Larson is being followed for anemia Day 1 of follow up   Subjective: No new complaints    Objective: Vital signs in last 24 hours: Vitals:   10/17/17 1226 10/17/17 1234 10/17/17 1254 10/17/17 1417  BP: 127/75 117/75 121/67 112/61  Pulse:  (!) 107 (!) 105 (!) 107  Resp:  (!) 23 18 18   Temp:   98.6 F (37 C) 98.7 F (37.1 C)  TempSrc:   Oral Oral  SpO2:  94% 95% 99%  Weight:      Height:       Weight change:   Intake/Output Summary (Last 24 hours) at 10/17/2017 1504 Last data filed at 10/17/2017 0300 Gross per 24 hour  Intake 720.83 ml  Output -  Net 720.83 ml     Exam: Heart:: Regular rate and rhythm, S1S2 present or without murmur or extra heart sounds Lungs: normal, clear to auscultation and clear to auscultation and percussion Abdomen: soft, nontender, normal bowel sounds   Lab Results: @LABTEST2 @ Micro Results: No results found for this or any previous visit (from the past 240 hour(s)). Studies/Results: Dg Chest 1 View  Result Date: 10/15/2017 CLINICAL DATA:  Upper abdominal pain EXAM: CHEST 1 VIEW COMPARISON:  09/07/2016 FINDINGS: The heart size and mediastinal contours are within normal limits. Both lungs are clear. The visualized skeletal structures are unremarkable. IMPRESSION: No active disease. Electronically Signed   By: Donavan Foil M.D.   On: 10/15/2017 22:48   Nm Pulmonary Perf And Vent  Result Date: 10/16/2017 CLINICAL DATA:  Chest pain and shortness of breath, high clinical suspicion of pulmonary embolism EXAM: NUCLEAR MEDICINE VENTILATION - PERFUSION LUNG SCAN TECHNIQUE: Ventilation images were obtained in multiple projections using inhaled aerosol Tc-72m DTPA. Perfusion images were obtained in multiple projections after intravenous injection of Tc-36m-MAA.  RADIOPHARMACEUTICALS:  34.104 mCi of Tc-81m DTPA aerosol inhalation and 4.008 mCi Tc72m-MAA IV COMPARISON:  None Correlation: Chest radiograph 10/15/2017 FINDINGS: Ventilation: No focal ventilation defects. Minimal central airway deposition of aerosol. Small amount of swallowed aerosol within stomach. Perfusion: Single small subsegmental perfusion defect at if RIGHT upper lobe. No other definite focal perfusion abnormalities. Chest radiograph: Clear lungs.  Normal heart size. IMPRESSION: Very low probability for pulmonary embolism. Electronically Signed   By: Lavonia Dana M.D.   On: 10/16/2017 16:41   US Venous Img Lower Bilateral  Result Date: 10/17/2017 CLINICAL DATA:  Bilateral lower extremity pain and edema, right greater than left. History of prior DVT and IVC filter placement. EXAM: BILATERAL LOWER EXTREMITY VENOUS DOPPLER ULTRASOUND TECHNIQUE: Gray-scale sonography with graded compression, as well as color Doppler and duplex ultrasound were performed to evaluate the lower extremity deep venous systems from the level of the common femoral vein and including the common femoral, femoral, profunda femoral, popliteal and calf veins including the posterior tibial, peroneal and gastrocnemius veins when visible. The superficial great saphenous vein was also interrogated. Spectral Doppler was utilized to evaluate flow at rest and with distal augmentation maneuvers in the common femoral, femoral and popliteal veins. COMPARISON:  Right lower extremity venous Doppler - 09/23/2017; 09/17/2017; 08/29/2017; CT scan abdomen pelvis-10/16/2017 FINDINGS: RIGHT LOWER EXTREMITY There is mixed echogenic occlusive DVT extending from the right common femoral vein through the saphenofemoral junction, the imaged portions of the deep femoral vein, the proximal, mid and distal aspects of  the right femoral vein, the popliteal vein and extending to involve the imaged portions of the tibial veins. Other Findings:  None. LEFT LOWER  EXTREMITY There is mixed echogenic occlusive DVT extending from the left common femoral vein through the saphenofemoral junction, the imaged portions of the deep femoral vein, the proximal, mid and distal aspects of the left femoral vein, the popliteal vein and extending to involve the imaged portions of the tibial veins. Other Findings:  None. IMPRESSION: Examination is positive for extensive occlusive bilateral lower extremity DVT involving the entirety of the bilateral lower extremity venous system. Note, bilateral pelvic venous and IVC thrombus was seen on preceding abdominal CT performed 10/16/2017. These results will be called to the ordering clinician or representative by the Radiologist Assistant, and communication documented in the PACS or zVision Dashboard. Electronically Signed   By: Sandi Mariscal M.D.   On: 10/17/2017 11:02   Ct Biopsy  Result Date: 10/17/2017 INDICATION: Right lower quadrant omental mass, unknown primary EXAM: CT-GUIDED BIOPSY RIGHT LOWER QUADRANT OMENTAL MASS MEDICATIONS: 1% LIDOCAINE LOCAL ANESTHESIA/SEDATION: 2.0 mg IV Versed; 75 mcg IV Fentanyl Moderate Sedation Time:  11 MINUTES The patient was continuously monitored during the procedure by the interventional radiology nurse under my direct supervision. PROCEDURE: The procedure, risks, benefits, and alternatives were explained to the patient. Questions regarding the procedure were encouraged and answered. The patient understands and consents to the procedure. Previous imaging reviewed. Patient positioned supine. Noncontrast localization CT performed. The right lower quadrant omental mass anterior to the cecum was localized. Overlying skin marked. Under sterile conditions and local anesthesia, a 17 gauge 6.8 cm access needle was advanced from a lateral approach to the right lower quadrant omental mass. Needle position confirmed with CT. 18 gauge core biopsies obtained. Samples placed in formalin. Needle removed. Postprocedure  imaging demonstrates no hemorrhage or hematoma. Patient tolerated the procedure well without complication. Vital sign monitoring by nursing staff during the procedure will continue as patient is in the special procedures unit for post procedure observation. FINDINGS: The images document guide needle placement within the right lower quadrant omental mass. Post biopsy images demonstrate no hemorrhage or hematoma. COMPLICATIONS: None immediate. IMPRESSION: Successful CT-guided core biopsy of the right lower quadrant omental mass Electronically Signed   By: Jerilynn Mages.  Shick M.D.   On: 10/17/2017 13:14   Ct Angio Abd/pel W And/or Wo Contrast  Result Date: 10/16/2017 CLINICAL DATA:  62 year old female with history of pancreatitis presents with leg and abdominal pain. EXAM: CTA ABDOMEN AND PELVIS wITHOUT AND WITH CONTRAST TECHNIQUE: Multidetector CT imaging of the abdomen and pelvis was performed using the standard protocol during bolus administration of intravenous contrast. Multiplanar reconstructed images and MIPs were obtained and reviewed to evaluate the vascular anatomy. CONTRAST:  142mL ISOVUE-370 IOPAMIDOL (ISOVUE-370) INJECTION 76% COMPARISON:  Abdominal CT dated 02/07/2017 FINDINGS: VASCULAR Aorta: Mild atherosclerotic calcification. No aneurysmal dilatation or dissection. Celiac: Patent without evidence of aneurysm, dissection, vasculitis or significant stenosis. SMA: Patent without evidence of aneurysm, dissection, vasculitis or significant stenosis. Renals: Both renal arteries are patent without evidence of aneurysm, dissection, vasculitis, fibromuscular dysplasia or significant stenosis. IMA: Patent without evidence of aneurysm, dissection, vasculitis or significant stenosis. Inflow: Mild atherosclerotic calcification of the iliac vessels. No aneurysmal dilatation or dissection. The iliac arteries are patent bilaterally. Proximal Outflow: Bilateral common femoral and visualized portions of the superficial and  profunda femoral arteries are patent without evidence of aneurysm, dissection, vasculitis or significant stenosis. Veins: Extensive near occlusive thrombus extending from the visualized  portions of the common femoral veins, external and common iliac veins and IVC to the level of the IVC filter. There is small amount of contrast in the peripheral lumen of these vessels. The suprarenal IVC filled with contrast and appears patent. No definite thrombus identified above the level of the IVC filter. The SMV, the SMV, and main portal vein are patent. 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 No portal venous gas. Review of the MIP images confirms the above findings. NON-VASCULAR Lower chest: Multiple bilateral pulmonary nodules most consistent with metastatic disease. An infectious process or septic emboli are less likely. Clinical correlation is recommended. Partially visualized area of lower attenuation in the right lower lobe/infrahilar vascular confluence (series 6, image 5). This is incompletely evaluated and may represent volume averaging artifact with perihilar fat or scarring. Acute pulmonary embolus is not entirely excluded. Clinical correlation is recommended. If there is clinical concern for acute PE further evaluation with CTA of the chest or V/Q scan recommended. There is no intra-abdominal free air. Small upper abdominal ascites as well as small amount of fluid within the pelvis. Hepatobiliary: An ill-defined focal area of heterogeneous enhancement is seen in the left lobe of the liver (series 6, image 23). The liver is otherwise unremarkable. No intrahepatic biliary ductal dilatation. Cholecystectomy. Pancreas: There is fatty infiltration of the uncinate and head of the pancreas. There is prominence of the soft tissues of the distal body and tail of the pancreas which may represent extension of the previously seen pancreatic mass or pancreatitis. Correlation with  pancreatic enzymes recommended. Spleen: Multiple small splenic hypodense lesions appear new or more prominent compared to the prior CT and concerning for extension of pancreatic neoplasm. Areas of infarct is less likely but not excluded. Adrenals/Urinary Tract: The adrenal glands are unremarkable. The kidneys, and the visualized ureters appear unremarkable as well. The urinary bladder is predominantly collapsed. There is apparent diffuse thickening of the bladder wall which may be partly related to underdistention. Cystitis is not excluded. Correlation with urinalysis recommended. Stomach/Bowel: Postsurgical changes of bowel with anastomotic suture in the sigmoid colon. There is sigmoid diverticulosis without active inflammatory changes. Moderate amount of stool noted throughout the colon no bowel obstruction. Normal appendix. Lymphatic: Mildly enlarged lymph node in the porta hepaticas measuring 14 mm in short axis. Multiple mildly rounded small retroperitoneal and para-aortic lymph nodes. Reproductive: Hysterectomy. Other: Extensive omental implants and nodularity, new compared to prior CT and consistent with omental caking. Musculoskeletal: Degenerative changes of the spine. No acute osseous pathology. IMPRESSION: VASCULAR 1. Extensive near complete occlusive thrombus extending from the common femoral veins to the IVC at the level of the IVC filter. No definite thrombus noted above the IVC. 2. Partially visualized small low-attenuation in the right lower lobe/right infrahilar pulmonary artery confluence may represent perivascular hilar fat or scarring. Acute PE is not entirely excluded. CTA of the chest or V/Q scan may provide better evaluation if there is clinical concern for acute PE. 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 NON-VASCULAR 1. Enlargement of the distal body and tail of the pancreas likely from extension of the previously seen pancreatic mass. 2. Metastatic disease including omental implant,  splenic lesions, and pulmonary nodules. Small ascites, likely malignant. Further evaluation with PET-CT and oncological consult is advised. 3. Mildly enlarged porta hepatic lymph node as well as slightly rounded retroperitoneal and para-aortic lymph nodes. 4. Focal ill-defined heterogeneous enhancement of the left lobe of the liver. These results were  called by telephone at the time of interpretation on 10/16/2017 at 1:28 am to Dr. Jannifer Franklin, who verbally acknowledged these results. Electronically Signed   By: Anner Crete M.D.   On: 10/16/2017 01:29   Medications: I have reviewed the patient's current medications. Scheduled Meds: . fentaNYL      . insulin aspart  0-9 Units Subcutaneous Q6H  . levothyroxine  125 mcg Oral QAC breakfast  . midazolam      . [START ON 10/19/2017] pantoprazole  40 mg Intravenous Q12H  . simvastatin  20 mg Oral Daily  . venlafaxine XR  75 mg Oral Q breakfast   Continuous Infusions: . methocarbamol (ROBAXIN)  IV Stopped (10/16/17 7425)  . pantoprozole (PROTONIX) infusion 8 mg/hr (10/17/17 0359)   PRN Meds:.acetaminophen **OR** acetaminophen, HYDROmorphone, methocarbamol (ROBAXIN)  IV, morphine injection, ondansetron **OR** ondansetron (ZOFRAN) IV, traMADol, traZODone CBC Latest Ref Rng & Units 10/17/2017 10/16/2017 10/16/2017  WBC 3.6 - 11.0 K/uL 11.3(H) - -  Hemoglobin 12.0 - 16.0 g/dL 8.8(L) 9.0(L) 8.9(L)  Hematocrit 35.0 - 47.0 % 27.4(L) 27.6(L) 28.0(L)  Platelets 150 - 440 K/uL 194 - -     Assessment: Principal Problem:   GI bleed Active Problems:   Symptomatic anemia   Acute GI bleeding   Deep vein thrombosis (DVT) of right lower extremity (HCC)   Hypothyroidism   Diabetes (HCC)   Multiple lesions of metastatic malignancy (HCC)   Generalized abdominal pain   Omental mass   Pancreatic mass  Ann Larson 62 y.o. female with recent DVT on eloquis , 08/2017 and 09/2017  EGD with Dr Allen Norris found hemmorhagic gastritis . S/p IVC filter. Admitted with Hb  6.6 grams Lesion on the pancreas seen at [facility] CT scan concerning of neoplasm. S/p blood transfusion.   Plan:  1. EGD tomorrow    I have discussed alternative options, risks & benefits,  which include, but are not limited to, bleeding, infection, perforation,respiratory complication & drug reaction.  The patient agrees with this plan & written consent will be obtained.      LOS: 2 days   Ann Bellows, MD 10/17/2017, 3:04 PM

## 2017-10-17 NOTE — Progress Notes (Signed)
On assessment pt is complaining of right leg pain. Pts leg feels warm, and swollen, pt states its  painful and tender. MD placing order.

## 2017-10-17 NOTE — Progress Notes (Signed)
Pt c/o of increasing pain on the left leg. Pt unable to bend knee, leg is warm to touch, tender and swollen. Pt has history of DVT. Dr. Marcille Blanco paged and made aware, ordered for one time dose Dilaudid 1mg  IV. Will continue to monitor and attend needs.

## 2017-10-17 NOTE — Progress Notes (Signed)
Pt sobbing, in severe pain 10/10. PRN Morphine 2mg  given. Pt requested if she can take something for sleep. Dr. Jannifer Franklin ordered Trazodone 25mg  QHS. Will administer and continue to monitor.

## 2017-10-17 NOTE — Consult Note (Signed)
[facility] Vascular Consult Note  MRN : [id number]  Ann Larson is a 62 y.o. (Jul 06, 1956) female who presents with chief complaint of  Chief Complaint  Patient presents with  . Abdominal Pain  .  History of Present Illness: I am asked to see the patient by Dr. Margaretmary Eddy for evaluation of DVT.  She is known to our service and several weeks ago had an extensive right lower extremity DVT.  This was associated with a GI bleed requiring cessation of anticoagulation.  As such, an IVC filter had to be placed.  She has not been able to resume anticoagulation.  Her right leg became markedly swollen but over several weeks she really has no pain or swelling in her right use of compression stockings and elevation.  Over the past couple of days, her left lower extremity has become markedly swollen and painful.  This is not nearly as large as her right leg was, but the swelling is prominent.  No ulceration or infection.  As part of her workup she has undergone an ultrasound showing bilateral lower extremity DVTs.  She is also undergone a CT scan which I have independently reviewed.  She has thrombosis of both femoral veins, iliac veins, and the inferior vena cava up to her IVC filter.  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  She actually had a biopsy for that today.  Since she has been in the hospital elevating her legs, the swelling may slightly better but the pain is still pretty significant and the swelling persists.  Current Facility-Administered Medications  Medication Dose Route Frequency Provider Last Rate Last Dose  . acetaminophen (TYLENOL) tablet 650 mg  650 mg Oral Q6H PRN Lance Coon, MD       Or  . acetaminophen (TYLENOL) suppository 650 mg  650 mg Rectal Q6H PRN Lance Coon, MD      . fentaNYL (SUBLIMAZE) 100 MCG/2ML injection           . HYDROmorphone (DILAUDID) tablet 1 mg  1 mg Oral Q6H PRN Gouru, Aruna, MD      .  insulin aspart (novoLOG) injection 0-9 Units  0-9 Units Subcutaneous Q6H Lance Coon, MD   2 Units at 10/17/17 1332  . levothyroxine (SYNTHROID, LEVOTHROID) tablet 125 mcg  125 mcg Oral QAC breakfast Lance Coon, MD   125 mcg at 10/17/17 1332  . methocarbamol (ROBAXIN) 500 mg in dextrose 5 % 50 mL IVPB  500 mg Intravenous Q6H PRN Lance Coon, MD   Stopped at 10/16/17 [phone]  . midazolam (VERSED) 5 MG/5ML injection           . morphine 2 MG/ML injection 2 mg  2 mg Intravenous Q4H PRN Lance Coon, MD   2 mg at 10/16/17 2351  . ondansetron (ZOFRAN) tablet 4 mg  4 mg Oral Q6H PRN Lance Coon, MD       Or  . ondansetron [facility]) injection 4 mg  4 mg Intravenous Q6H PRN Lance Coon, MD   4 mg at 10/17/17 0608  . pantoprazole (PROTONIX) 80 mg in sodium chloride 0.9 % 250 mL (0.32 mg/mL) infusion  8 mg/hr Intravenous Continuous Lance Coon, MD 25 mL/hr at 10/17/17 0359 8 mg/hr at 10/17/17 0359  . [START ON 10/19/2017] pantoprazole (PROTONIX) injection 40 mg  40 mg Intravenous Q12H Lance Coon, MD      . simvastatin (ZOCOR) tablet 20 mg  20 mg Oral Daily Lance Coon, MD  20 mg at 10/16/17 1809  . traMADol (ULTRAM) tablet 50 mg  50 mg Oral Q6H PRN Gouru, Aruna, MD   50 mg at 10/16/17 1809  . traZODone (DESYREL) tablet 25 mg  25 mg Oral QHS PRN Lance Coon, MD   25 mg at 10/17/17 0026  . venlafaxine XR (EFFEXOR-XR) 24 hr capsule 75 mg  75 mg Oral Q breakfast Lance Coon, MD   Stopped at 10/16/17 1607    Past Medical History:  Diagnosis Date  . Depression   . Diabetes mellitus without complication ([place])   . Diverticulitis   . DVT (deep vein thrombosis) in pregnancy ([place]) 08/29/2017   Right popliteal and femoral  . GERD (gastroesophageal reflux disease)   . Hyperlipidemia   . Hypertension   . Hypothyroidism   . Obesity     Past Surgical History:  Procedure Laterality Date  . ABDOMINAL HYSTERECTOMY  2005  . CHOLECYSTECTOMY  2015  . COLONOSCOPY WITH PROPOFOL N/A 07/01/2015    Procedure: COLONOSCOPY WITH PROPOFOL;  Surgeon: Josefine Class, MD;  Location: [facility] ENDOSCOPY;  Service: Endoscopy;  Laterality: N/A;  . ESOPHAGOGASTRODUODENOSCOPY (EGD) WITH PROPOFOL N/A 07/01/2015   Procedure: ESOPHAGOGASTRODUODENOSCOPY (EGD) WITH PROPOFOL;  Surgeon: Josefine Class, MD;  Location: [facility] ENDOSCOPY;  Service: Endoscopy;  Laterality: N/A;  . ESOPHAGOGASTRODUODENOSCOPY (EGD) WITH PROPOFOL N/A 09/08/2017   Procedure: ESOPHAGOGASTRODUODENOSCOPY (EGD) WITH PROPOFOL;  Surgeon: Lucilla Lame, MD;  Location: [facility] ENDOSCOPY;  Service: Endoscopy;  Laterality: N/A;  . ESOPHAGOGASTRODUODENOSCOPY (EGD) WITH PROPOFOL N/A 09/21/2017   Procedure: ESOPHAGOGASTRODUODENOSCOPY (EGD) WITH PROPOFOL;  Surgeon: Lucilla Lame, MD;  Location: [facility] ENDOSCOPY;  Service: Endoscopy;  Laterality: N/A;  . IVC FILTER INSERTION N/A 09/08/2017   Procedure: IVC FILTER INSERTION;  Surgeon: Algernon Huxley, MD;  Location: [place] CV LAB;  Service: Cardiovascular;  Laterality: N/A;    Social History Social History   Tobacco Use  . Smoking status: Never Smoker  . Smokeless tobacco: Never Used  Substance Use Topics  . Alcohol use: No  . Drug use: No    Family History Family History  Problem Relation Age of Onset  . Deep vein thrombosis Neg Hx   No bleeding disorders, clotting disorders, aneurysms, or autoimmune diseases  Allergies  Allergen Reactions  . Sulfa Antibiotics      REVIEW OF SYSTEMS (Negative unless checked)  Constitutional: [] Weight loss  [] Fever  [] Chills Cardiac: [] Chest pain   [] Chest pressure   [] Palpitations   [] Shortness of breath when laying flat   [] Shortness of breath at rest   [] Shortness of breath with exertion. Vascular:  [] Pain in legs with walking   [] Pain in legs at rest   [] Pain in legs when laying flat   [] Claudication   [] Pain in feet when walking  [] Pain in feet at rest  [] Pain in feet when laying flat   [x] History of DVT   [x] Phlebitis   [x] Swelling in legs    [] Varicose veins   [] Non-healing ulcers Pulmonary:   [] Uses home oxygen   [] Productive cough   [] Hemoptysis   [] Wheeze  [] COPD   [] Asthma Neurologic:  [] Dizziness  [] Blackouts   [] Seizures   [] History of stroke   [] History of TIA  [] Aphasia   [] Temporary blindness   [] Dysphagia   [] Weakness or numbness in arms   [] Weakness or numbness in legs Musculoskeletal:  [] Arthritis   [] Joint swelling   [] Joint pain   [] Low back pain Hematologic:  [] Easy bruising  [] Easy bleeding   [] Hypercoagulable state   [x] Anemic  []   Hepatitis Gastrointestinal:  [x] Blood in stool   [] Vomiting blood  [x] Gastroesophageal reflux/heartburn   [] Difficulty swallowing. Genitourinary:  [] Chronic kidney disease   [] Difficult urination  [] Frequent urination  [] Burning with urination   [] Blood in urine Skin:  [] Rashes   [] Ulcers   [] Wounds Psychological:  [] History of anxiety   []  History of major depression.  Physical Examination  Vitals:   10/17/17 1226 10/17/17 1226 10/17/17 1234 10/17/17 1254  BP:  127/75 117/75 121/67  Pulse: (!) 106  (!) 107 (!) 105  Resp: (!) 21  (!) 23 18  Temp:    98.6 F (37 C)  TempSrc:    Oral  SpO2: 97%  94% 95%  Weight:      Height:       Body mass index is 34.41 kg/m. Gen:  WD/WN, NAD Head: [place]/AT, No temporalis wasting.  Ear/Nose/Throat: Hearing grossly intact, nares w/o erythema or drainage, oropharynx w/o Erythema/Exudate Eyes: Sclera non-icteric, conjunctiva clear Neck: Trachea midline.  No JVD.  Pulmonary:  Good air movement, respirations not labored, equal bilaterally.  Cardiac: RRR, normal S1, S2. Vascular:  Vessel Right Left  Radial Palpable Palpable                          PT 1+ Palpable Not Palpable  DP Palpable 1+ Palpable    Musculoskeletal: M/S 5/5 throughout.  Extremities without ischemic changes.  No deformity or atrophy.  Trace right lower extremity edema.  2+ left lower extremity edema. Neurologic: Sensation grossly intact in extremities.  Symmetrical.   Speech is fluent. Motor exam as listed above. Psychiatric: Judgment intact, Mood & affect appropriate for pt's clinical situation. Dermatologic: No rashes or ulcers noted.  No cellulitis or open wounds.       CBC Lab Results  Component Value Date   WBC 11.3 (H) 10/17/2017   HGB 8.8 (L) 10/17/2017   HCT 27.4 (L) 10/17/2017   MCV 88.6 10/17/2017   PLT 194 10/17/2017    BMET    Component Value Date/Time   NA 137 10/17/2017 0453   NA 142 05/07/2012 1448   K 4.4 10/17/2017 0453   K 3.9 05/07/2012 1448   CL 106 10/17/2017 0453   CL 105 05/07/2012 1448   CO2 20 (L) 10/17/2017 0453   CO2 30 05/07/2012 1448   GLUCOSE 190 (H) 10/17/2017 0453   GLUCOSE 95 05/07/2012 1448   BUN 25 (H) 10/17/2017 0453   BUN 22 (H) 05/07/2012 1448   CREATININE 1.04 (H) 10/17/2017 0453   CREATININE 1.40 (H) 05/07/2012 1448   CALCIUM 11.1 (H) 10/17/2017 0453   CALCIUM 10.3 (H) 05/07/2012 1448   GFRNONAA 57 (L) 10/17/2017 0453   GFRNONAA 42 (L) 05/07/2012 1448   GFRAA >60 10/17/2017 0453   GFRAA 49 (L) 05/07/2012 1448   Estimated Creatinine Clearance: 73.5 mL/min (A) (by C-G formula based on SCr of 1.04 mg/dL (H)).  COAG Lab Results  Component Value Date   INR 1.65 10/17/2017    Radiology Dg Chest 1 View  Result Date: 10/15/2017 CLINICAL DATA:  Upper abdominal pain EXAM: CHEST 1 VIEW COMPARISON:  09/07/2016 FINDINGS: The heart size and mediastinal contours are within normal limits. Both lungs are clear. The visualized skeletal structures are unremarkable. IMPRESSION: No active disease. Electronically Signed   By: Donavan Foil M.D.   On: 10/15/2017 22:48   Nm Pulmonary Perf And Vent  Result Date: 10/16/2017 CLINICAL DATA:  Chest pain and shortness of breath, high  clinical suspicion of pulmonary embolism EXAM: NUCLEAR MEDICINE VENTILATION - PERFUSION LUNG SCAN TECHNIQUE: Ventilation images were obtained in multiple projections using inhaled aerosol Tc-46m DTPA. Perfusion images were obtained  in multiple projections after intravenous injection of Tc-50m-MAA. RADIOPHARMACEUTICALS:  34.104 mCi of Tc-60m DTPA aerosol inhalation and 4.008 mCi Tc71m-MAA IV COMPARISON:  None Correlation: Chest radiograph 10/15/2017 FINDINGS: Ventilation: No focal ventilation defects. Minimal central airway deposition of aerosol. Small amount of swallowed aerosol within stomach. Perfusion: Single small subsegmental perfusion defect at if RIGHT upper lobe. No other definite focal perfusion abnormalities. Chest radiograph: Clear lungs.  Normal heart size. IMPRESSION: Very low probability for pulmonary embolism. Electronically Signed   By: Lavonia Dana M.D.   On: 10/16/2017 16:41   US Venous Img Lower Bilateral  Result Date: 10/17/2017 CLINICAL DATA:  Bilateral lower extremity pain and edema, right greater than left. History of prior DVT and IVC filter placement. EXAM: BILATERAL LOWER EXTREMITY VENOUS DOPPLER ULTRASOUND TECHNIQUE: Gray-scale sonography with graded compression, as well as color Doppler and duplex ultrasound were performed to evaluate the lower extremity deep venous systems from the level of the common femoral vein and including the common femoral, femoral, profunda femoral, popliteal and calf veins including the posterior tibial, peroneal and gastrocnemius veins when visible. The superficial great saphenous vein was also interrogated. Spectral Doppler was utilized to evaluate flow at rest and with distal augmentation maneuvers in the common femoral, femoral and popliteal veins. COMPARISON:  Right lower extremity venous Doppler - 09/23/2017; 09/17/2017; 08/29/2017; CT scan abdomen pelvis-10/16/2017 FINDINGS: RIGHT LOWER EXTREMITY There is mixed echogenic occlusive DVT extending from the right common femoral vein through the saphenofemoral junction, the imaged portions of the deep femoral vein, the proximal, mid and distal aspects of the right femoral vein, the popliteal vein and extending to involve the imaged  portions of the tibial veins. Other Findings:  None. LEFT LOWER EXTREMITY There is mixed echogenic occlusive DVT extending from the left common femoral vein through the saphenofemoral junction, the imaged portions of the deep femoral vein, the proximal, mid and distal aspects of the left femoral vein, the popliteal vein and extending to involve the imaged portions of the tibial veins. Other Findings:  None. IMPRESSION: Examination is positive for extensive occlusive bilateral lower extremity DVT involving the entirety of the bilateral lower extremity venous system. Note, bilateral pelvic venous and IVC thrombus was seen on preceding abdominal CT performed 10/16/2017. These results will be called to the ordering clinician or representative by the Radiologist Assistant, and communication documented in the PACS or zVision Dashboard. Electronically Signed   By: Sandi Mariscal M.D.   On: 10/17/2017 11:02   US Venous Img Lower Unilateral Right  Result Date: 09/23/2017 CLINICAL DATA:  Right lower extremity pain and swelling. EXAM: Right LOWER EXTREMITY VENOUS DOPPLER ULTRASOUND TECHNIQUE: Gray-scale sonography with graded compression, as well as color Doppler and duplex ultrasound were performed to evaluate the lower extremity deep venous systems from the level of the common femoral vein and including the common femoral, femoral, profunda femoral, popliteal and calf veins including the posterior tibial, peroneal and gastrocnemius veins when visible. The superficial great saphenous vein was also interrogated. Spectral Doppler was utilized to evaluate flow at rest and with distal augmentation maneuvers in the common femoral, femoral and popliteal veins. COMPARISON:  Ultrasound of September 17, 2017. FINDINGS: Contralateral Common Femoral Vein: Respiratory phasicity is normal and symmetric with the symptomatic side. No evidence of thrombus. Normal compressibility. Common Femoral Vein: Noncompressible with no flow  consistent  with occlusive thrombus. Saphenofemoral Junction: Noncompressible with no flow consistent with occlusive thrombus. Profunda Femoral Vein: Noncompressible with no flow consistent with occlusive thrombus. Femoral Vein: Noncompressible with no flow consistent with occlusive thrombus. Popliteal Vein: Noncompressible with no flow consistent with occlusive thrombus. Calf Veins: Posterior tibial and peroneal veins are noncompressible with no flow consistent with occlusive thrombus. Venous Reflux:  None. Other Findings:  None. IMPRESSION: There is again noted occlusive deep venous thrombosis of the right common femoral, superficial femoral, profunda femoral, popliteal, posterior tibial and peroneal veins. Electronically Signed   By: Marijo Conception, M.D.   On: 09/23/2017 02:37   US Venous Img Lower Unilateral Right  Result Date: 09/17/2017 CLINICAL DATA:  Increased swelling and pain in RIGHT lower extremity, known DVT, prior IVC filter placement EXAM: RIGHT LOWER EXTREMITY VENOUS DOPPLER ULTRASOUND TECHNIQUE: Gray-scale sonography with graded compression, as well as color Doppler and duplex ultrasound were performed to evaluate the lower extremity deep venous systems from the level of the common femoral vein and including the common femoral, femoral, profunda femoral, popliteal and calf veins including the posterior tibial, peroneal and gastrocnemius veins when visible. The superficial great saphenous vein was also interrogated. Spectral Doppler was utilized to evaluate flow at rest and with distal augmentation maneuvers in the common femoral, femoral and popliteal veins. COMPARISON:  08/29/2017 FINDINGS: Contralateral Common Femoral Vein: Respiratory phasicity is normal and symmetric with the symptomatic side. No evidence of thrombus. Normal compressibility. Common Femoral Vein: Acute appearing hypoechoic nonocclusive thrombus new since prior exam. Impaired compressibility and spontaneous venous flow. Saphenofemoral  Junction: Nonocclusive thrombus, new since prior exam Profunda Femoral Vein: No evidence of thrombus. Normal compressibility. Femoral Vein: Occlusive thrombus with impaired compressibility, new since prior exam Popliteal Vein: Occlusive thrombus with impaired compressibility Calf Veins: Occlusive thrombus with impaired compressibility Superficial Great Saphenous Vein: No evidence of thrombus. Normal compressibility. Venous Reflux:  None. Other Findings:  None. IMPRESSION: Propagation of deep venous thrombosis of the RIGHT lower extremity now involving the RIGHT common femoral and femoral veins in addition to previously identified RIGHT popliteal and calf veins. Electronically Signed   By: Lavonia Dana M.D.   On: 09/17/2017 17:57   Ct Biopsy  Result Date: 10/17/2017 INDICATION: Right lower quadrant omental mass, unknown primary EXAM: CT-GUIDED BIOPSY RIGHT LOWER QUADRANT OMENTAL MASS MEDICATIONS: 1% LIDOCAINE LOCAL ANESTHESIA/SEDATION: 2.0 mg IV Versed; 75 mcg IV Fentanyl Moderate Sedation Time:  11 MINUTES The patient was continuously monitored during the procedure by the interventional radiology nurse under my direct supervision. PROCEDURE: The procedure, risks, benefits, and alternatives were explained to the patient. Questions regarding the procedure were encouraged and answered. The patient understands and consents to the procedure. Previous imaging reviewed. Patient positioned supine. Noncontrast localization CT performed. The right lower quadrant omental mass anterior to the cecum was localized. Overlying skin marked. Under sterile conditions and local anesthesia, a 17 gauge 6.8 cm access needle was advanced from a lateral approach to the right lower quadrant omental mass. Needle position confirmed with CT. 18 gauge core biopsies obtained. Samples placed in formalin. Needle removed. Postprocedure imaging demonstrates no hemorrhage or hematoma. Patient tolerated the procedure well without complication. Vital  sign monitoring by nursing staff during the procedure will continue as patient is in the special procedures unit for post procedure observation. FINDINGS: The images document guide needle placement within the right lower quadrant omental mass. Post biopsy images demonstrate no hemorrhage or hematoma. COMPLICATIONS: None immediate. IMPRESSION: Successful CT-guided core biopsy of  the right lower quadrant omental mass Electronically Signed   By: Jerilynn Mages.  Shick M.D.   On: 10/17/2017 13:14   Ct Angio Abd/pel W And/or Wo Contrast  Result Date: 10/16/2017 CLINICAL DATA:  62 year old female with history of pancreatitis presents with leg and abdominal pain. EXAM: CTA ABDOMEN AND PELVIS wITHOUT AND WITH CONTRAST TECHNIQUE: Multidetector CT imaging of the abdomen and pelvis was performed using the standard protocol during bolus administration of intravenous contrast. Multiplanar reconstructed images and MIPs were obtained and reviewed to evaluate the vascular anatomy. CONTRAST:  158mL ISOVUE-370 IOPAMIDOL (ISOVUE-370) INJECTION 76% COMPARISON:  Abdominal CT dated 02/07/2017 FINDINGS: VASCULAR Aorta: Mild atherosclerotic calcification. No aneurysmal dilatation or dissection. Celiac: Patent without evidence of aneurysm, dissection, vasculitis or significant stenosis. SMA: Patent without evidence of aneurysm, dissection, vasculitis or significant stenosis. Renals: Both renal arteries are patent without evidence of aneurysm, dissection, vasculitis, fibromuscular dysplasia or significant stenosis. IMA: Patent without evidence of aneurysm, dissection, vasculitis or significant stenosis. Inflow: Mild atherosclerotic calcification of the iliac vessels. No aneurysmal dilatation or dissection. The iliac arteries are patent bilaterally. Proximal Outflow: Bilateral common femoral and visualized portions of the superficial and profunda femoral arteries are patent without evidence of aneurysm, dissection, vasculitis or significant stenosis.  Veins: Extensive near occlusive thrombus extending from the visualized portions of the common femoral veins, external and common iliac veins and IVC to the level of the IVC filter. There is small amount of contrast in the peripheral lumen of these vessels. The suprarenal IVC filled with contrast and appears patent. No definite thrombus identified above the level of the IVC filter. The SMV, the SMV, and main portal vein are patent. 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 No portal venous gas. Review of the MIP images confirms the above findings. NON-VASCULAR Lower chest: Multiple bilateral pulmonary nodules most consistent with metastatic disease. An infectious process or septic emboli are less likely. Clinical correlation is recommended. Partially visualized area of lower attenuation in the right lower lobe/infrahilar vascular confluence (series 6, image 5). This is incompletely evaluated and may represent volume averaging artifact with perihilar fat or scarring. Acute pulmonary embolus is not entirely excluded. Clinical correlation is recommended. If there is clinical concern for acute PE further evaluation with CTA of the chest or V/Q scan recommended. There is no intra-abdominal free air. Small upper abdominal ascites as well as small amount of fluid within the pelvis. Hepatobiliary: An ill-defined focal area of heterogeneous enhancement is seen in the left lobe of the liver (series 6, image 23). The liver is otherwise unremarkable. No intrahepatic biliary ductal dilatation. Cholecystectomy. Pancreas: There is fatty infiltration of the uncinate and head of the pancreas. There is prominence of the soft tissues of the distal body and tail of the pancreas which may represent extension of the previously seen pancreatic mass or pancreatitis. Correlation with pancreatic enzymes recommended. Spleen: Multiple small splenic hypodense lesions appear new or more prominent compared  to the prior CT and concerning for extension of pancreatic neoplasm. Areas of infarct is less likely but not excluded. Adrenals/Urinary Tract: The adrenal glands are unremarkable. The kidneys, and the visualized ureters appear unremarkable as well. The urinary bladder is predominantly collapsed. There is apparent diffuse thickening of the bladder wall which may be partly related to underdistention. Cystitis is not excluded. Correlation with urinalysis recommended. Stomach/Bowel: Postsurgical changes of bowel with anastomotic suture in the sigmoid colon. There is sigmoid diverticulosis without active inflammatory changes. Moderate amount of stool noted  throughout the colon no bowel obstruction. Normal appendix. Lymphatic: Mildly enlarged lymph node in the porta hepaticas measuring 14 mm in short axis. Multiple mildly rounded small retroperitoneal and para-aortic lymph nodes. Reproductive: Hysterectomy. Other: Extensive omental implants and nodularity, new compared to prior CT and consistent with omental caking. Musculoskeletal: Degenerative changes of the spine. No acute osseous pathology. IMPRESSION: VASCULAR 1. Extensive near complete occlusive thrombus extending from the common femoral veins to the IVC at the level of the IVC filter. No definite thrombus noted above the IVC. 2. Partially visualized small low-attenuation in the right lower lobe/right infrahilar pulmonary artery confluence may represent perivascular hilar fat or scarring. Acute PE is not entirely excluded. CTA of the chest or V/Q scan may provide better evaluation if there is clinical concern for acute PE. 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 NON-VASCULAR 1. Enlargement of the distal body and tail of the pancreas likely from extension of the previously seen pancreatic mass. 2. Metastatic disease including omental implant, splenic lesions, and pulmonary nodules. Small ascites, likely malignant. Further evaluation with PET-CT and oncological  consult is advised. 3. Mildly enlarged porta hepatic lymph node as well as slightly rounded retroperitoneal and para-aortic lymph nodes. 4. Focal ill-defined heterogeneous enhancement of the left lobe of the liver. These results were called by telephone at the time of interpretation on 10/16/2017 at 1:28 am to Dr. Jannifer Franklin, who verbally acknowledged these results. Electronically Signed   By: Anner Crete M.D.   On: 10/16/2017 01:29      Assessment/Plan 1.  Bilateral lower extremity DVT including iliac vein DVTs and likely thrombosis all the way to her IVC filter.  Given her advanced malignancy that is not entirely surprising given her inability to be anticoagulated for her recent bleed.  This is a very difficult situation.  There is no role for thrombolytic therapy or thrombectomy as we cannot anticoagulate the patient and recurrent thrombosis would be extremely likely in the risk of bleeding with procedure would also be very high.  Her left leg is actually nowhere near as large as swollen as her right leg was previously.  She needs to getting compression stockings ASAP.  If at any point she would be a candidate for anticoagulation this would obviously be helpful for her thrombotic process, but I doubt that will be possible.  Her right leg is actually minimally swollen now with almost no pain and hopefully her left leg symptoms will abate over several weeks time.  No role for intervention given the ongoing issues. 2.  Relatively recent GI bleed.  Precludes anticoagulation and increasing her risk of thrombotic propagation.  Has not been a candidate for anticoagulation 3.  Stage IV malignancy with metastatic disease.  Status post biopsy today.  This is likely the underlying cause of her thrombotic process and portends an extremely poor prognosis. 4.  Anemia.  Hemoglobin still less than 9.  Poor candidate for anticoagulation.  This is likely a combination of GI bleeding as well as her malignancy.   Leotis Pain, MD  10/17/2017 1:59 PM    This note was created with Dragon medical transcription system.  Any error is purely unintentional

## 2017-10-17 NOTE — Progress Notes (Signed)
Initial Nutrition Assessment  DOCUMENTATION CODES:   Obesity unspecified  INTERVENTION:   Glucerna po BID, each supplement provides 220 kcal and 10 grams of protein  Premier Protein, each supplement provides 180 kcal and 30 grams of protein   NUTRITION DIAGNOSIS:   Increased nutrient needs related to catabolic illness(pancreatic mass and metastatic lesions concerning for malignancy) as evidenced by estimated needs.   GOAL:   Patient will meet greater than or equal to 90% of their needs   MONITOR:   PO intake, Supplement acceptance, Labs, Weight trends  REASON FOR ASSESSMENT:   Malnutrition Screening Tool    ASSESSMENT:   62 yo female admitted 2/25 with abdominal pain, pancreatic mass, metastatic malignancy, symptomatic RL DVT, hypothyroidism. PMH diabetes, peptic ulcers, gastritis, GI bleed, obesity I  Unable to see pt x 3 attempts due to diagnostic procedures (EGD and subsequent CT-guided biopsy of omental mass today).   Medications:  Insulin ss Synthroid protonix venlafaxine XR (possible side effect-- hyponatremia) IV-- methocarbamol, protonix  Labs:  CBG's 192, 175, 194 Hbg A1c 6.2 WBC 11.3 (H) HGB 8.8 (L) HCT 27.4 (L) CO2 20 (L) BUN 25 (H) Cr 1.04 (H) Ca 11.1 (H) (no albumin value for corrected Ca)  Wt Readings from Last 15 Encounters:  10/16/17 233 lb 0.4 oz (105.7 kg)  10/05/17 226 lb 3.2 oz (102.6 kg)  09/26/17 228 lb (103.4 kg)  09/23/17 215 lb (97.5 kg)  09/21/17 215 lb (97.5 kg)  09/17/17 215 lb (97.5 kg)  09/07/17 215 lb (97.5 kg)  08/29/17 230 lb (104.3 kg)  02/06/17 230 lb (104.3 kg)  11/12/16 225 lb (102.1 kg)  07/01/15 235 lb (106.6 kg)      Diet Order:  Diet Carb Modified Fluid consistency: Thin; Room service appropriate? Yes  EDUCATION NEEDS:   Not appropriate for education at this time  Skin:  Skin Assessment: Reviewed RN Assessment  Last BM:  2/24  Height:   Ht Readings from Last 1 Encounters:  10/15/17 5\' 9"   (1.753 m)    Weight:   Wt Readings from Last 1 Encounters:  10/16/17 233 lb 0.4 oz (105.7 kg)    Ideal Body Weight:  65.8 kg  BMI:  Body mass index is 34.41 kg/m.  Estimated Nutritional Needs:   Kcal:  2,200-2,400  Protein:  100-115 grams  Fluid:  2.2-2.4 L    Edmonia Lynch, MS Dietetic Intern Pager: [phone]

## 2017-10-17 NOTE — Procedures (Signed)
RLQ omental mass  S/p CT bx omental mass  No comp Stable EBL 0 Full report in pacs Path pending

## 2017-10-17 NOTE — Progress Notes (Signed)
Chaplain followed up from previous day after bio. Chart said she was in pain (10/10) previous night. Chaplain prayed with the family and Pt.    10/17/17 1300  Clinical Encounter Type  Visited With Patient;Family  Visit Type Follow-up;Spiritual support  Referral From Nurse  Spiritual Encounters  Spiritual Needs Prayer;Emotional

## 2017-10-17 NOTE — H&P (Signed)
  Plan for CT bx omental mass today  H&P reviewed and no changes  Risks and benefits discussed with the patient including, but not limited to bleeding, infection, damage to adjacent structures or low yield requiring additional tests.  All of the patient's questions were answered, patient is agreeable to proceed. Consent signed and in chart.

## 2017-10-18 ENCOUNTER — Encounter: Payer: Self-pay | Admitting: Student

## 2017-10-18 ENCOUNTER — Encounter
Admission: EM | Disposition: A | Discharge status: Home Health | Payer: Self-pay | Source: Home / Self Care | Attending: Internal Medicine

## 2017-10-18 ENCOUNTER — Inpatient Hospital Stay: Payer: BLUE CROSS/BLUE SHIELD | Admitting: Certified Registered"

## 2017-10-18 DIAGNOSIS — D689 Coagulation defect, unspecified: Secondary | ICD-10-CM

## 2017-10-18 DIAGNOSIS — K254 Chronic or unspecified gastric ulcer with hemorrhage: Principal | ICD-10-CM

## 2017-10-18 HISTORY — PX: ESOPHAGOGASTRODUODENOSCOPY (EGD) WITH PROPOFOL: SHX5813

## 2017-10-18 LAB — GLUCOSE, CAPILLARY
GLUCOSE-CAPILLARY: 189 mg/dL — AB (ref 65–99)
GLUCOSE-CAPILLARY: 232 mg/dL — AB (ref 65–99)
Glucose-Capillary: 206 mg/dL — ABNORMAL HIGH (ref 65–99)
Glucose-Capillary: 216 mg/dL — ABNORMAL HIGH (ref 65–99)

## 2017-10-18 LAB — BASIC METABOLIC PANEL
Anion gap: 9 (ref 5–15)
BUN: 35 mg/dL — AB (ref 6–20)
CHLORIDE: 107 mmol/L (ref 101–111)
CO2: 21 mmol/L — AB (ref 22–32)
CREATININE: 1.37 mg/dL — AB (ref 0.44–1.00)
Calcium: 11 mg/dL — ABNORMAL HIGH (ref 8.9–10.3)
GFR calc Af Amer: 47 mL/min — ABNORMAL LOW (ref >60)
GFR calc non Af Amer: 41 mL/min — ABNORMAL LOW (ref >60)
GLUCOSE: 210 mg/dL — AB (ref 65–99)
POTASSIUM: 4.5 mmol/L (ref 3.5–5.1)
SODIUM: 137 mmol/L (ref 135–145)

## 2017-10-18 LAB — PTT FACTOR INHIBITOR (MIXING STUDY): APTT: 24.8 s (ref 22.9–30.2)

## 2017-10-18 LAB — CBC
HCT: 25.3 % — ABNORMAL LOW (ref 35.0–47.0)
Hemoglobin: 8.1 g/dL — ABNORMAL LOW (ref 12.0–16.0)
MCH: 28.1 pg (ref 26.0–34.0)
MCHC: 32 g/dL (ref 32.0–36.0)
MCV: 87.6 fL (ref 80.0–100.0)
Platelets: 212 10*3/uL (ref 150–440)
RBC: 2.89 MIL/uL — ABNORMAL LOW (ref 3.80–5.20)
RDW: 16.8 % — ABNORMAL HIGH (ref 11.5–14.5)
WBC: 12.9 10*3/uL — ABNORMAL HIGH (ref 3.6–11.0)

## 2017-10-18 LAB — AFP TUMOR MARKER: AFP, SERUM, TUMOR MARKER: 4.8 ng/mL (ref 0.0–8.3)

## 2017-10-18 LAB — CA 125: Cancer Antigen (CA) 125: 241.9 U/mL — ABNORMAL HIGH (ref 0.0–38.1)

## 2017-10-18 SURGERY — ESOPHAGOGASTRODUODENOSCOPY (EGD) WITH PROPOFOL
Anesthesia: General

## 2017-10-18 MED ORDER — LIDOCAINE HCL (PF) 2 % IJ SOLN
INTRAMUSCULAR | Status: AC
Start: 2017-10-18 — End: ?
  Filled 2017-10-18: qty 10

## 2017-10-18 MED ORDER — PROPOFOL 500 MG/50ML IV EMUL
INTRAVENOUS | Status: AC
Start: 2017-10-18 — End: ?
  Filled 2017-10-18: qty 50

## 2017-10-18 MED ORDER — OXYCODONE HCL 5 MG PO TABS
10.0000 mg | ORAL_TABLET | ORAL | Status: DC | PRN
  Administered 2017-10-18 – 2017-10-19 (×3): 10 mg via ORAL
  Filled 2017-10-18 (×3): qty 2

## 2017-10-18 MED ORDER — HYDROMORPHONE HCL 2 MG PO TABS
1.0000 mg | ORAL_TABLET | Freq: Four times a day (QID) | ORAL | Status: DC | PRN
  Filled 2017-10-18: qty 1

## 2017-10-18 MED ORDER — INSULIN GLARGINE 100 UNIT/ML ~~LOC~~ SOLN
11.0000 [IU] | Freq: Every day | SUBCUTANEOUS | Status: DC
  Administered 2017-10-18: 11 [IU] via SUBCUTANEOUS
  Filled 2017-10-18 (×2): qty 0.11

## 2017-10-18 MED ORDER — GLYCOPYRROLATE 0.2 MG/ML IJ SOLN
INTRAMUSCULAR | Status: DC | PRN
  Administered 2017-10-18: 0.2 mg via INTRAVENOUS

## 2017-10-18 MED ORDER — PROPOFOL 10 MG/ML IV BOLUS
INTRAVENOUS | Status: DC | PRN
  Administered 2017-10-18 (×3): 100 mg via INTRAVENOUS
  Administered 2017-10-18: 50 mg via INTRAVENOUS

## 2017-10-18 MED ORDER — INSULIN ASPART 100 UNIT/ML ~~LOC~~ SOLN
0.0000 [IU] | Freq: Three times a day (TID) | SUBCUTANEOUS | Status: DC
  Administered 2017-10-18 – 2017-10-19 (×3): 3 [IU] via SUBCUTANEOUS
  Administered 2017-10-20 – 2017-10-22 (×8): 2 [IU] via SUBCUTANEOUS
  Filled 2017-10-18 (×12): qty 1

## 2017-10-18 MED ORDER — PHENYLEPHRINE HCL 10 MG/ML IJ SOLN
INTRAMUSCULAR | Status: DC | PRN
  Administered 2017-10-18 (×5): 200 ug via INTRAVENOUS

## 2017-10-18 MED ORDER — LIDOCAINE HCL (CARDIAC) 20 MG/ML IV SOLN
INTRAVENOUS | Status: DC | PRN
  Administered 2017-10-18 (×2): 50 mg via INTRATRACHEAL

## 2017-10-18 MED ORDER — INSULIN ASPART 100 UNIT/ML ~~LOC~~ SOLN: 0.0000 [IU] | Freq: Every day | SUBCUTANEOUS | Status: DC

## 2017-10-18 NOTE — Progress Notes (Signed)
Hematology/Oncology Progress Note [facility] Telephone:([phone] [phone]  Patient Care Team: Lorelee Market, MD as PCP - General (Family Medicine)   Name of the patient: Ann Larson  [id number]  01-08-1956  Date of visit: 10/18/17   INTERVAL HISTORY-  Patient was seen and evaluated at bedside. S/p CT guided biopsy yesterday and s/p EGD today. EGD showed gastric ulcer oozing blood. She complains pain and requests more pain medication. Daughter at bedside.    Review of systems- Review of Systems  Constitutional: Positive for malaise/fatigue. Negative for chills and fever.  HENT: Negative for nosebleeds.   Eyes: Negative for photophobia.  Respiratory: Negative for cough and hemoptysis.   Cardiovascular: Negative for chest pain.  Gastrointestinal: Positive for abdominal pain and blood in stool. Negative for nausea and vomiting.  Genitourinary: Negative for dysuria.  Musculoskeletal:       Bilateral lower extremities pain.   Skin: Negative for rash.  Neurological: Positive for weakness. Negative for dizziness.  Psychiatric/Behavioral: The patient is nervous/anxious.     Allergies  Allergen Reactions  . Sulfa Antibiotics     Patient Active Problem List   Diagnosis Date Noted  . Multiple lesions of metastatic malignancy ([place]) 10/16/2017  . Generalized abdominal pain   . Omental mass   . Pancreatic mass   . Hypothyroidism 10/15/2017  . Diabetes ([place]) 10/15/2017  . Hypertension 10/05/2017  . Deep vein thrombosis (DVT) of right lower extremity ([place]) 09/26/2017  . Personal history of peptic ulcer disease   . Gastritis with hemorrhage   . Symptomatic anemia   . Heme + stool   . Acute GI bleeding   . GI bleed 09/07/2017     Past Medical History:  Diagnosis Date  . Depression   . Diabetes mellitus without complication ([place])   . Diverticulitis   . DVT (deep vein thrombosis) in pregnancy ([place]) 08/29/2017   Right popliteal  and femoral  . GERD (gastroesophageal reflux disease)   . Hyperlipidemia   . Hypertension   . Hypothyroidism   . Obesity      Past Surgical History:  Procedure Laterality Date  . ABDOMINAL HYSTERECTOMY  2005  . CHOLECYSTECTOMY  2015  . COLONOSCOPY WITH PROPOFOL N/A 07/01/2015   Procedure: COLONOSCOPY WITH PROPOFOL;  Surgeon: Josefine Class, MD;  Location: [facility] ENDOSCOPY;  Service: Endoscopy;  Laterality: N/A;  . ESOPHAGOGASTRODUODENOSCOPY (EGD) WITH PROPOFOL N/A 07/01/2015   Procedure: ESOPHAGOGASTRODUODENOSCOPY (EGD) WITH PROPOFOL;  Surgeon: Josefine Class, MD;  Location: [facility] ENDOSCOPY;  Service: Endoscopy;  Laterality: N/A;  . ESOPHAGOGASTRODUODENOSCOPY (EGD) WITH PROPOFOL N/A 09/08/2017   Procedure: ESOPHAGOGASTRODUODENOSCOPY (EGD) WITH PROPOFOL;  Surgeon: Lucilla Lame, MD;  Location: [facility] ENDOSCOPY;  Service: Endoscopy;  Laterality: N/A;  . ESOPHAGOGASTRODUODENOSCOPY (EGD) WITH PROPOFOL N/A 09/21/2017   Procedure: ESOPHAGOGASTRODUODENOSCOPY (EGD) WITH PROPOFOL;  Surgeon: Lucilla Lame, MD;  Location: [facility] ENDOSCOPY;  Service: Endoscopy;  Laterality: N/A;  . IVC FILTER INSERTION N/A 09/08/2017   Procedure: IVC FILTER INSERTION;  Surgeon: Algernon Huxley, MD;  Location: [place] CV LAB;  Service: Cardiovascular;  Laterality: N/A;    Social History   Socioeconomic History  . Marital status: Married    Spouse name: Not on file  . Number of children: Not on file  . Years of education: Not on file  . Highest education level: Not on file  Social Needs  . Financial resource strain: Not on file  . Food insecurity - worry: Not on file  . Food insecurity - inability: Not  on file  . Transportation needs - medical: Not on file  . Transportation needs - non-medical: Not on file  Occupational History  . Not on file  Tobacco Use  . Smoking status: Never Smoker  . Smokeless tobacco: Never Used  Substance and Sexual Activity  . Alcohol use: No  . Drug use: No  . Sexual  activity: Not on file  Other Topics Concern  . Not on file  Social History Narrative  . Not on file     Family History  Problem Relation Age of Onset  . Deep vein thrombosis Neg Hx      Current Facility-Administered Medications:  .  acetaminophen (TYLENOL) tablet 650 mg, 650 mg, Oral, Q6H PRN **OR** acetaminophen (TYLENOL) suppository 650 mg, 650 mg, Rectal, Q6H PRN, Lance Coon, MD .  feeding supplement (GLUCERNA SHAKE) (GLUCERNA SHAKE) liquid 237 mL, 237 mL, Oral, BID BM, Gouru, Aruna, MD .  HYDROmorphone (DILAUDID) tablet 1 mg, 1 mg, Oral, Q6H PRN, Wieting, Richard, MD .  insulin aspart (novoLOG) injection 0-5 Units, 0-5 Units, Subcutaneous, QHS, Wieting, Richard, MD .  insulin aspart (novoLOG) injection 0-9 Units, 0-9 Units, Subcutaneous, TID WC, Loletha Grayer, MD, 3 Units at 10/18/17 1744 .  insulin glargine (LANTUS) injection 11 Units, 11 Units, Subcutaneous, QHS, Loletha Grayer, MD, 11 Units at 10/18/17 2222 .  levothyroxine (SYNTHROID, LEVOTHROID) tablet 125 mcg, 125 mcg, Oral, QAC breakfast, Lance Coon, MD, 125 mcg at 10/17/17 1332 .  methocarbamol (ROBAXIN) 500 mg in dextrose 5 % 50 mL IVPB, 500 mg, Intravenous, Q6H PRN, Lance Coon, MD, Stopped at 10/16/17 [phone] .  morphine 2 MG/ML injection 2 mg, 2 mg, Intravenous, Q4H PRN, Lance Coon, MD, 2 mg at 10/18/17 1348 .  ondansetron (ZOFRAN) tablet 4 mg, 4 mg, Oral, Q6H PRN **OR** ondansetron (ZOFRAN) injection 4 mg, 4 mg, Intravenous, Q6H PRN, Lance Coon, MD, 4 mg at 10/17/17 2229 .  oxyCODONE (Oxy IR/ROXICODONE) immediate release tablet 10 mg, 10 mg, Oral, Q4H PRN, Loletha Grayer, MD, 10 mg at 10/18/17 2019 .  pantoprazole (PROTONIX) 80 mg in sodium chloride 0.9 % 250 mL (0.32 mg/mL) infusion, 8 mg/hr, Intravenous, Continuous, Lance Coon, MD, Last Rate: 25 mL/hr at 10/18/17 1745, 8 mg/hr at 10/18/17 1745 .  [START ON 10/19/2017] pantoprazole (PROTONIX) injection 40 mg, 40 mg, Intravenous, Q12H, Lance Coon,  MD .  protein supplement (PREMIER PROTEIN) liquid, 11 oz, Oral, Q24H, Gouru, Aruna, MD .  simvastatin (ZOCOR) tablet 20 mg, 20 mg, Oral, Daily, Lance Coon, MD, 20 mg at 10/18/17 1658 .  traZODone (DESYREL) tablet 25 mg, 25 mg, Oral, QHS PRN, Lance Coon, MD, 25 mg at 10/17/17 0026 .  venlafaxine XR (EFFEXOR-XR) 24 hr capsule 75 mg, 75 mg, Oral, Q breakfast, Lance Coon, MD, Stopped at 10/16/17 7989   Physical exam:  Vitals:   10/18/17 1210 10/18/17 1220 10/18/17 1546 10/18/17 1934  BP: 120/69 (!) 119/56 132/65 (!) 126/53  Pulse: (!) 107 (!) 107 (!) 109 (!) 108  Resp: (!) 24 19  15   Temp:   98.9 F (37.2 C) 99 F (37.2 C)  TempSrc:   Oral Oral  SpO2: 98% 93% 97% 96%  Weight:      Height:       GENERAL:Alert, no distress and comfortable.  EYES: +  Pallor, non icterus OROPHARYNX: no thrush or ulceration; good dentition  NECK: supple, no masses felt LUNGS: clear to auscultation and  No wheeze or crackles HEART/CVS: regular rate & rhythm and no murmurs; No  lower extremity edema ABDOMEN: abdomen soft, she feels diffuse tenderness with palpation,  Has normal bowel sounds Musculoskeletal:no cyanosis of digits and no clubbing. Lower extremities tenderness.  PSYCH: alert & oriented x 3  NEURO: no focal motor/sensory deficits SKIN:  no rashes or significant lesions      CMP Latest Ref Rng & Units 10/18/2017  Glucose 65 - 99 mg/dL 210(H)  BUN 6 - 20 mg/dL 35(H)  Creatinine 0.44 - 1.00 mg/dL 1.37(H)  Sodium 135 - 145 mmol/L 137  Potassium 3.5 - 5.1 mmol/L 4.5  Chloride 101 - 111 mmol/L 107  CO2 22 - 32 mmol/L 21(L)  Calcium 8.9 - 10.3 mg/dL 11.0(H)  Total Protein 6.5 - 8.1 g/dL -  Total Bilirubin 0.3 - 1.2 mg/dL -  Alkaline Phos 38 - 126 U/L -  AST 15 - 41 U/L -  ALT 14 - 54 U/L -   CBC Latest Ref Rng & Units 10/18/2017  WBC 3.6 - 11.0 K/uL 12.9(H)  Hemoglobin 12.0 - 16.0 g/dL 8.1(L)  Hematocrit 35.0 - 47.0 % 25.3(L)  Platelets 150 - 440 K/uL 212    @IMAGES @  Dg  Chest 1 View  Result Date: 10/15/2017 CLINICAL DATA:  Upper abdominal pain EXAM: CHEST 1 VIEW COMPARISON:  09/07/2016 FINDINGS: The heart size and mediastinal contours are within normal limits. Both lungs are clear. The visualized skeletal structures are unremarkable. IMPRESSION: No active disease. Electronically Signed   By: Donavan Foil M.D.   On: 10/15/2017 22:48   Nm Pulmonary Perf And Vent  Result Date: 10/16/2017 CLINICAL DATA:  Chest pain and shortness of breath, high clinical suspicion of pulmonary embolism EXAM: NUCLEAR MEDICINE VENTILATION - PERFUSION LUNG SCAN TECHNIQUE: Ventilation images were obtained in multiple projections using inhaled aerosol Tc-64m DTPA. Perfusion images were obtained in multiple projections after intravenous injection of Tc-107m-MAA. RADIOPHARMACEUTICALS:  34.104 mCi of Tc-58m DTPA aerosol inhalation and 4.008 mCi Tc85m-MAA IV COMPARISON:  None Correlation: Chest radiograph 10/15/2017 FINDINGS: Ventilation: No focal ventilation defects. Minimal central airway deposition of aerosol. Small amount of swallowed aerosol within stomach. Perfusion: Single small subsegmental perfusion defect at if RIGHT upper lobe. No other definite focal perfusion abnormalities. Chest radiograph: Clear lungs.  Normal heart size. IMPRESSION: Very low probability for pulmonary embolism. Electronically Signed   By: Lavonia Dana M.D.   On: 10/16/2017 16:41   US Venous Img Lower Bilateral  Result Date: 10/17/2017 CLINICAL DATA:  Bilateral lower extremity pain and edema, right greater than left. History of prior DVT and IVC filter placement. EXAM: BILATERAL LOWER EXTREMITY VENOUS DOPPLER ULTRASOUND TECHNIQUE: Gray-scale sonography with graded compression, as well as color Doppler and duplex ultrasound were performed to evaluate the lower extremity deep venous systems from the level of the common femoral vein and including the common femoral, femoral, profunda femoral, popliteal and calf veins  including the posterior tibial, peroneal and gastrocnemius veins when visible. The superficial great saphenous vein was also interrogated. Spectral Doppler was utilized to evaluate flow at rest and with distal augmentation maneuvers in the common femoral, femoral and popliteal veins. COMPARISON:  Right lower extremity venous Doppler - 09/23/2017; 09/17/2017; 08/29/2017; CT scan abdomen pelvis-10/16/2017 FINDINGS: RIGHT LOWER EXTREMITY There is mixed echogenic occlusive DVT extending from the right common femoral vein through the saphenofemoral junction, the imaged portions of the deep femoral vein, the proximal, mid and distal aspects of the right femoral vein, the popliteal vein and extending to involve the imaged portions of the tibial veins. Other Findings:  None. LEFT LOWER  EXTREMITY There is mixed echogenic occlusive DVT extending from the left common femoral vein through the saphenofemoral junction, the imaged portions of the deep femoral vein, the proximal, mid and distal aspects of the left femoral vein, the popliteal vein and extending to involve the imaged portions of the tibial veins. Other Findings:  None. IMPRESSION: Examination is positive for extensive occlusive bilateral lower extremity DVT involving the entirety of the bilateral lower extremity venous system. Note, bilateral pelvic venous and IVC thrombus was seen on preceding abdominal CT performed 10/16/2017. These results will be called to the ordering clinician or representative by the Radiologist Assistant, and communication documented in the PACS or zVision Dashboard. Electronically Signed   By: Sandi Mariscal M.D.   On: 10/17/2017 11:02   US Venous Img Lower Unilateral Right  Result Date: 09/23/2017 CLINICAL DATA:  Right lower extremity pain and swelling. EXAM: Right LOWER EXTREMITY VENOUS DOPPLER ULTRASOUND TECHNIQUE: Gray-scale sonography with graded compression, as well as color Doppler and duplex ultrasound were performed to evaluate the  lower extremity deep venous systems from the level of the common femoral vein and including the common femoral, femoral, profunda femoral, popliteal and calf veins including the posterior tibial, peroneal and gastrocnemius veins when visible. The superficial great saphenous vein was also interrogated. Spectral Doppler was utilized to evaluate flow at rest and with distal augmentation maneuvers in the common femoral, femoral and popliteal veins. COMPARISON:  Ultrasound of September 17, 2017. FINDINGS: Contralateral Common Femoral Vein: Respiratory phasicity is normal and symmetric with the symptomatic side. No evidence of thrombus. Normal compressibility. Common Femoral Vein: Noncompressible with no flow consistent with occlusive thrombus. Saphenofemoral Junction: Noncompressible with no flow consistent with occlusive thrombus. Profunda Femoral Vein: Noncompressible with no flow consistent with occlusive thrombus. Femoral Vein: Noncompressible with no flow consistent with occlusive thrombus. Popliteal Vein: Noncompressible with no flow consistent with occlusive thrombus. Calf Veins: Posterior tibial and peroneal veins are noncompressible with no flow consistent with occlusive thrombus. Venous Reflux:  None. Other Findings:  None. IMPRESSION: There is again noted occlusive deep venous thrombosis of the right common femoral, superficial femoral, profunda femoral, popliteal, posterior tibial and peroneal veins. Electronically Signed   By: Marijo Conception, M.D.   On: 09/23/2017 02:37   Ct Biopsy  Result Date: 10/17/2017 INDICATION: Right lower quadrant omental mass, unknown primary EXAM: CT-GUIDED BIOPSY RIGHT LOWER QUADRANT OMENTAL MASS MEDICATIONS: 1% LIDOCAINE LOCAL ANESTHESIA/SEDATION: 2.0 mg IV Versed; 75 mcg IV Fentanyl Moderate Sedation Time:  11 MINUTES The patient was continuously monitored during the procedure by the interventional radiology nurse under my direct supervision. PROCEDURE: The procedure, risks,  benefits, and alternatives were explained to the patient. Questions regarding the procedure were encouraged and answered. The patient understands and consents to the procedure. Previous imaging reviewed. Patient positioned supine. Noncontrast localization CT performed. The right lower quadrant omental mass anterior to the cecum was localized. Overlying skin marked. Under sterile conditions and local anesthesia, a 17 gauge 6.8 cm access needle was advanced from a lateral approach to the right lower quadrant omental mass. Needle position confirmed with CT. 18 gauge core biopsies obtained. Samples placed in formalin. Needle removed. Postprocedure imaging demonstrates no hemorrhage or hematoma. Patient tolerated the procedure well without complication. Vital sign monitoring by nursing staff during the procedure will continue as patient is in the special procedures unit for post procedure observation. FINDINGS: The images document guide needle placement within the right lower quadrant omental mass. Post biopsy images demonstrate no hemorrhage or hematoma.  COMPLICATIONS: None immediate. IMPRESSION: Successful CT-guided core biopsy of the right lower quadrant omental mass Electronically Signed   By: Jerilynn Mages.  Shick M.D.   On: 10/17/2017 13:14   Ct Angio Abd/pel W And/or Wo Contrast  Result Date: 10/16/2017 CLINICAL DATA:  62 year old female with history of pancreatitis presents with leg and abdominal pain. EXAM: CTA ABDOMEN AND PELVIS wITHOUT AND WITH CONTRAST TECHNIQUE: Multidetector CT imaging of the abdomen and pelvis was performed using the standard protocol during bolus administration of intravenous contrast. Multiplanar reconstructed images and MIPs were obtained and reviewed to evaluate the vascular anatomy. CONTRAST:  156mL ISOVUE-370 IOPAMIDOL (ISOVUE-370) INJECTION 76% COMPARISON:  Abdominal CT dated 02/07/2017 FINDINGS: VASCULAR Aorta: Mild atherosclerotic calcification. No aneurysmal dilatation or dissection.  Celiac: Patent without evidence of aneurysm, dissection, vasculitis or significant stenosis. SMA: Patent without evidence of aneurysm, dissection, vasculitis or significant stenosis. Renals: Both renal arteries are patent without evidence of aneurysm, dissection, vasculitis, fibromuscular dysplasia or significant stenosis. IMA: Patent without evidence of aneurysm, dissection, vasculitis or significant stenosis. Inflow: Mild atherosclerotic calcification of the iliac vessels. No aneurysmal dilatation or dissection. The iliac arteries are patent bilaterally. Proximal Outflow: Bilateral common femoral and visualized portions of the superficial and profunda femoral arteries are patent without evidence of aneurysm, dissection, vasculitis or significant stenosis. Veins: Extensive near occlusive thrombus extending from the visualized portions of the common femoral veins, external and common iliac veins and IVC to the level of the IVC filter. There is small amount of contrast in the peripheral lumen of these vessels. The suprarenal IVC filled with contrast and appears patent. No definite thrombus identified above the level of the IVC filter. The SMV, the SMV, and main portal vein are patent. 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 No portal venous gas. Review of the MIP images confirms the above findings. NON-VASCULAR Lower chest: Multiple bilateral pulmonary nodules most consistent with metastatic disease. An infectious process or septic emboli are less likely. Clinical correlation is recommended. Partially visualized area of lower attenuation in the right lower lobe/infrahilar vascular confluence (series 6, image 5). This is incompletely evaluated and may represent volume averaging artifact with perihilar fat or scarring. Acute pulmonary embolus is not entirely excluded. Clinical correlation is recommended. If there is clinical concern for acute PE further evaluation with CTA of  the chest or V/Q scan recommended. There is no intra-abdominal free air. Small upper abdominal ascites as well as small amount of fluid within the pelvis. Hepatobiliary: An ill-defined focal area of heterogeneous enhancement is seen in the left lobe of the liver (series 6, image 23). The liver is otherwise unremarkable. No intrahepatic biliary ductal dilatation. Cholecystectomy. Pancreas: There is fatty infiltration of the uncinate and head of the pancreas. There is prominence of the soft tissues of the distal body and tail of the pancreas which may represent extension of the previously seen pancreatic mass or pancreatitis. Correlation with pancreatic enzymes recommended. Spleen: Multiple small splenic hypodense lesions appear new or more prominent compared to the prior CT and concerning for extension of pancreatic neoplasm. Areas of infarct is less likely but not excluded. Adrenals/Urinary Tract: The adrenal glands are unremarkable. The kidneys, and the visualized ureters appear unremarkable as well. The urinary bladder is predominantly collapsed. There is apparent diffuse thickening of the bladder wall which may be partly related to underdistention. Cystitis is not excluded. Correlation with urinalysis recommended. Stomach/Bowel: Postsurgical changes of bowel with anastomotic suture in the sigmoid colon. There is sigmoid diverticulosis  without active inflammatory changes. Moderate amount of stool noted throughout the colon no bowel obstruction. Normal appendix. Lymphatic: Mildly enlarged lymph node in the porta hepaticas measuring 14 mm in short axis. Multiple mildly rounded small retroperitoneal and para-aortic lymph nodes. Reproductive: Hysterectomy. Other: Extensive omental implants and nodularity, new compared to prior CT and consistent with omental caking. Musculoskeletal: Degenerative changes of the spine. No acute osseous pathology. IMPRESSION: VASCULAR 1. Extensive near complete occlusive thrombus  extending from the common femoral veins to the IVC at the level of the IVC filter. No definite thrombus noted above the IVC. 2. Partially visualized small low-attenuation in the right lower lobe/right infrahilar pulmonary artery confluence may represent perivascular hilar fat or scarring. Acute PE is not entirely excluded. CTA of the chest or V/Q scan may provide better evaluation if there is clinical concern for acute PE. 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 NON-VASCULAR 1. Enlargement of the distal body and tail of the pancreas likely from extension of the previously seen pancreatic mass. 2. Metastatic disease including omental implant, splenic lesions, and pulmonary nodules. Small ascites, likely malignant. Further evaluation with PET-CT and oncological consult is advised. 3. Mildly enlarged porta hepatic lymph node as well as slightly rounded retroperitoneal and para-aortic lymph nodes. 4. Focal ill-defined heterogeneous enhancement of the left lobe of the liver. These results were called by telephone at the time of interpretation on 10/16/2017 at 1:28 am to Dr. Jannifer Franklin, who verbally acknowledged these results. Electronically Signed   By: Anner Crete M.D.   On: 10/16/2017 01:29      Assessment and plan- Patient is a 62 y.o. female with history of hemorraghic gastritis, extensive bilateral lower extremity DVT, presents with symptomatic anemia, hemoglobin 6.6, abdominal pain with abnormal CT findings.   # metastatic disease,CA 19.9 significantly elevated at [id number], likely metastatic cancer, involving lung, liver, omental caking.  Awaiting pathology results.  Outpatient PET scan.   # symptomatic anemia, continue monitor, keep Hemoglobin above 7. S/p EGD which revealed bleeding ulcer, treated with homospray.  # Coagulopathy, awaiting mixing study.   # Extensive bilateral  lower extremity DVT: anticoagulation is contraindicated at this point due to ongoing blood loss. DVT maybe related to her  metastatic tumor burden.  vascular surgeon recommendation noted. No intervention at this point.    Earlie Server, MD, PhD Hematology Oncology [facility] Pager- [phone] 10/18/2017

## 2017-10-18 NOTE — Progress Notes (Signed)
Patient ID: Ann Larson, female   DOB: May 26, 1956, 62 y.o.   MRN: [id number]  [facility] PROGRESS NOTE  Ann Larson WPY:[id number] DOB: 04/14/1956 DOA: 10/15/2017 PCP: Lorelee Market, MD  HPI/Subjective:   Objective: Vitals:   10/18/17 1210 10/18/17 1220  BP: 120/69 (!) 119/56  Pulse: (!) 107 (!) 107  Resp: (!) 24 19  Temp:    SpO2: 98% 93%    Filed Weights   10/15/17 2217 10/15/17 2218 10/16/17 0149  Weight: 102.5 kg (226 lb) 107.2 kg (236 lb 5.3 oz) 105.7 kg (233 lb 0.4 oz)    ROS: Review of Systems  Constitutional: Negative for chills and fever.  Eyes: Negative for blurred vision.  Respiratory: Negative for cough and shortness of breath.   Cardiovascular: Negative for chest pain.  Gastrointestinal: Negative for abdominal pain, constipation, diarrhea, nausea and vomiting.  Genitourinary: Negative for dysuria.  Musculoskeletal: Negative for joint pain.  Neurological: Negative for dizziness and headaches.   Exam: Physical Exam  Constitutional: She is oriented to person, place, and time.  HENT:  Nose: No mucosal edema.  Mouth/Throat: No oropharyngeal exudate or posterior oropharyngeal edema.  Eyes: Conjunctivae, EOM and lids are normal. Pupils are equal, round, and reactive to light.  Neck: No JVD present. Carotid bruit is not present. No edema present. No thyroid mass and no thyromegaly present.  Cardiovascular: S1 normal and S2 normal. Exam reveals no gallop.  No murmur heard. Pulses:      Dorsalis pedis pulses are 2+ on the right side, and 2+ on the left side.  Respiratory: No respiratory distress. She has no wheezes. She has no rhonchi. She has no rales.  GI: Soft. Bowel sounds are normal. There is no tenderness.  Musculoskeletal:       Right ankle: She exhibits swelling.       Left ankle: She exhibits swelling.  Lymphadenopathy:    She has no cervical adenopathy.  Neurological: She is alert and oriented to person, place, and time. No  cranial nerve deficit.  Skin: Skin is warm. No rash noted. Nails show no clubbing.  Psychiatric: She has a normal mood and affect.      Data Reviewed: Basic Metabolic Panel: Recent Labs  Lab 10/15/17 2230 10/16/17 0846 10/17/17 0453 10/18/17 0311  NA 134* 137 137 137  K 4.1 4.2 4.4 4.5  CL 102 105 106 107  CO2 24 22 20* 21*  GLUCOSE 241* 176* 190* 210*  BUN 18 22* 25* 35*  CREATININE 1.08* 1.18* 1.04* 1.37*  CALCIUM 10.2 10.3 11.1* 11.0*   Liver Function Tests: Recent Labs  Lab 10/15/17 2230  AST 33  ALT 20  ALKPHOS 85  BILITOT 0.2*  PROT 7.6  ALBUMIN 3.1*   Recent Labs  Lab 10/15/17 2230  LIPASE 26   CBC: Recent Labs  Lab 10/15/17 2230 10/16/17 0846 10/16/17 1615 10/16/17 2135 10/17/17 0453 10/18/17 0311  WBC 11.5* 11.7*  --   --  11.3* 12.9*  NEUTROABS 7.9*  --   --   --   --   --   HGB 6.6* 8.8* 8.9* 9.0* 8.8* 8.1*  HCT 21.6* 27.6* 28.0* 27.6* 27.4* 25.3*  MCV 90.0 87.2  --   --  88.6 87.6  PLT 233 219  --   --  194 212   Cardiac Enzymes: Recent Labs  Lab 10/15/17 2230  TROPONINI <0.03    CBG: Recent Labs  Lab 10/17/17 0552 10/17/17 1253 10/17/17 1807 10/18/17 0017 10/18/17 0526  GLUCAP 194* 178* 178* 232* 206*      Studies: Nm Pulmonary Perf And Vent  Result Date: 10/16/2017 CLINICAL DATA:  Chest pain and shortness of breath, high clinical suspicion of pulmonary embolism EXAM: NUCLEAR MEDICINE VENTILATION - PERFUSION LUNG SCAN TECHNIQUE: Ventilation images were obtained in multiple projections using inhaled aerosol Tc-53m DTPA. Perfusion images were obtained in multiple projections after intravenous injection of Tc-81m-MAA. RADIOPHARMACEUTICALS:  34.104 mCi of Tc-25m DTPA aerosol inhalation and 4.008 mCi Tc75m-MAA IV COMPARISON:  None Correlation: Chest radiograph 10/15/2017 FINDINGS: Ventilation: No focal ventilation defects. Minimal central airway deposition of aerosol. Small amount of swallowed aerosol within stomach. Perfusion:  Single small subsegmental perfusion defect at if RIGHT upper lobe. No other definite focal perfusion abnormalities. Chest radiograph: Clear lungs.  Normal heart size. IMPRESSION: Very low probability for pulmonary embolism. Electronically Signed   By: Lavonia Dana M.D.   On: 10/16/2017 16:41   US Venous Img Lower Bilateral  Result Date: 10/17/2017 CLINICAL DATA:  Bilateral lower extremity pain and edema, right greater than left. History of prior DVT and IVC filter placement. EXAM: BILATERAL LOWER EXTREMITY VENOUS DOPPLER ULTRASOUND TECHNIQUE: Gray-scale sonography with graded compression, as well as color Doppler and duplex ultrasound were performed to evaluate the lower extremity deep venous systems from the level of the common femoral vein and including the common femoral, femoral, profunda femoral, popliteal and calf veins including the posterior tibial, peroneal and gastrocnemius veins when visible. The superficial great saphenous vein was also interrogated. Spectral Doppler was utilized to evaluate flow at rest and with distal augmentation maneuvers in the common femoral, femoral and popliteal veins. COMPARISON:  Right lower extremity venous Doppler - 09/23/2017; 09/17/2017; 08/29/2017; CT scan abdomen pelvis-10/16/2017 FINDINGS: RIGHT LOWER EXTREMITY There is mixed echogenic occlusive DVT extending from the right common femoral vein through the saphenofemoral junction, the imaged portions of the deep femoral vein, the proximal, mid and distal aspects of the right femoral vein, the popliteal vein and extending to involve the imaged portions of the tibial veins. Other Findings:  None. LEFT LOWER EXTREMITY There is mixed echogenic occlusive DVT extending from the left common femoral vein through the saphenofemoral junction, the imaged portions of the deep femoral vein, the proximal, mid and distal aspects of the left femoral vein, the popliteal vein and extending to involve the imaged portions of the tibial  veins. Other Findings:  None. IMPRESSION: Examination is positive for extensive occlusive bilateral lower extremity DVT involving the entirety of the bilateral lower extremity venous system. Note, bilateral pelvic venous and IVC thrombus was seen on preceding abdominal CT performed 10/16/2017. These results will be called to the ordering clinician or representative by the Radiologist Assistant, and communication documented in the PACS or zVision Dashboard. Electronically Signed   By: Sandi Mariscal M.D.   On: 10/17/2017 11:02   Ct Biopsy  Result Date: 10/17/2017 INDICATION: Right lower quadrant omental mass, unknown primary EXAM: CT-GUIDED BIOPSY RIGHT LOWER QUADRANT OMENTAL MASS MEDICATIONS: 1% LIDOCAINE LOCAL ANESTHESIA/SEDATION: 2.0 mg IV Versed; 75 mcg IV Fentanyl Moderate Sedation Time:  11 MINUTES The patient was continuously monitored during the procedure by the interventional radiology nurse under my direct supervision. PROCEDURE: The procedure, risks, benefits, and alternatives were explained to the patient. Questions regarding the procedure were encouraged and answered. The patient understands and consents to the procedure. Previous imaging reviewed. Patient positioned supine. Noncontrast localization CT performed. The right lower quadrant omental mass anterior to the cecum was localized. Overlying skin marked. Under sterile conditions  and local anesthesia, a 17 gauge 6.8 cm access needle was advanced from a lateral approach to the right lower quadrant omental mass. Needle position confirmed with CT. 18 gauge core biopsies obtained. Samples placed in formalin. Needle removed. Postprocedure imaging demonstrates no hemorrhage or hematoma. Patient tolerated the procedure well without complication. Vital sign monitoring by nursing staff during the procedure will continue as patient is in the special procedures unit for post procedure observation. FINDINGS: The images document guide needle placement within the  right lower quadrant omental mass. Post biopsy images demonstrate no hemorrhage or hematoma. COMPLICATIONS: None immediate. IMPRESSION: Successful CT-guided core biopsy of the right lower quadrant omental mass Electronically Signed   By: Jerilynn Mages.  Shick M.D.   On: 10/17/2017 13:14    Scheduled Meds: . feeding supplement (GLUCERNA SHAKE)  237 mL Oral BID BM  . insulin aspart  0-5 Units Subcutaneous QHS  . insulin aspart  0-9 Units Subcutaneous TID WC  . insulin glargine  11 Units Subcutaneous QHS  . levothyroxine  125 mcg Oral QAC breakfast  . [START ON 10/19/2017] pantoprazole  40 mg Intravenous Q12H  . protein supplement shake  11 oz Oral Q24H  . simvastatin  20 mg Oral Daily  . venlafaxine XR  75 mg Oral Q breakfast   Continuous Infusions: . methocarbamol (ROBAXIN)  IV Stopped (10/16/17 1610)  . pantoprozole (PROTONIX) infusion 8 mg/hr (10/18/17 0600)    Assessment/Plan:  1. Symptomatic anemia with epigastric abdominal pain and GI bleed.  Patient had endoscopy today showing ulcer that was oozing.  Continue to watch hemoglobins.  Patient on Protonix drip.  Clear liquid diet later on this evening.  Anemia likely combination of iron deficiency anemia and anemia of chronic disease. 2. 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  Still awaiting biopsies to come back. 3. DVT bilateral lower extremity unable to give anticoagulation with GI bleed.  Patient has IVC filter.  Pain control with oxycodone.  She states that this helped her the most. 4. Type 2 diabetes mellitus start glargine insulin 11 units nightly and sliding scale 5. Hypothyroidism unspecified on levothyroxine 6. Hyperlipidemia unspecified on simvastatin 7. Depression on Effexor  Code Status:     Code Status Orders  (From admission, onward)        Start     Ordered   10/16/17 0027  Full code  Continuous     10/16/17 0026    Code Status History    Date Active Date Inactive Code Status Order  ID Comments User Context   09/07/2017 16:09 09/09/2017 16:37 Full Code [id number]  Hillary Bow, MD ED     Family Communication: Permission to speak medically in front of everybody in the room Disposition Plan: To be determined  Consultants:  Oncology  Gastroenterology  Procedures:  Endoscopy  Time spent: 30 minutes  [place]

## 2017-10-18 NOTE — Progress Notes (Addendum)
Nutrition Follow-up  DOCUMENTATION CODES:   Obesity unspecified  INTERVENTION:   Glucerna po BID, each supplement provides 220 kcal and 10 grams of protein  Premier Protein, each supplement provides 180 kcal and 30 grams of protein   NUTRITION DIAGNOSIS:   Increased nutrient needs related to catabolic illness(pancreatic mass and metastatic lesions concerning for malignancy) as evidenced by estimated needs.   GOAL:   Patient will meet greater than or equal to 90% of their needs   MONITOR:   Diet advancement, Labs, Weight trends, I & O's  REASON FOR ASSESSMENT:   Malnutrition Screening Tool    ASSESSMENT:   62 yo female admitted 2/25 with abdominal pain, pancreatic mass, metastatic malignancy, symptomatic RL DVT, hypothyroidism. PMH diabetes, peptic ulcers, gastritis, GI bleed, obesity I  Pt currently NPO for EGD.   Per Gastroenterology note (posted 2/28 11:40am)  -Pt will remain NPO for 4 hrs post EGD; then start on CLD. -EGD findings: Normal esophagus and duodenum. - Non-obstructing oozing gastric ulcer with necrotic appearance (biopsied).       Pt reports that she has had a significantly decreased appetite for the past 6 months. She had trouble describing exact reasons (she is still recovering from EGD and was not feeling well); however, she primarily believes it is due to lack of interest in food (smell of food does not sound appealing, she is full after 2-3 bites, and her pain makes her uninterested in food).   Pt denies any issues eating (chewing swallowing), denies discomfort or pain in GI tract with PO intake, but reports only consuming soft foods:  B: scrambled eggs Snack: Atkins chocolate shake L/D: PB with crackers or bagel with cheese Beverages: Pt tries to drink water throughout the day (~1/2 cup at a time). Sometimes drinks tea or diet soda.  In the hospital, pt reports being able to eat some baked potato/pears at lunch yesterday and a little spaghetti  at dinner prior to NPO status.  Pt reports noticing weight loss over the past few months, but was not able to recall her UBW. States that she just noticed changes in her face r/t wt loss. Wt history from previous encounters indicates increased weight; however, pt's severe edema makes changes in wt hx unreliable indicator of nutrition status.  Wt Readings from Last 15 Encounters:  10/16/17 233 lb 0.4 oz (105.7 kg)  10/05/17 226 lb 3.2 oz (102.6 kg)  09/26/17 228 lb (103.4 kg)  09/23/17 215 lb (97.5 kg)  09/21/17 215 lb (97.5 kg)  09/17/17 215 lb (97.5 kg)  09/07/17 215 lb (97.5 kg)  08/29/17 230 lb (104.3 kg)  02/06/17 230 lb (104.3 kg)  11/12/16 225 lb (102.1 kg)  07/01/15 235 lb (106.6 kg)     NUTRITION - FOCUSED PHYSICAL EXAM:    Most Recent Value  Orbital Region  No depletion  Upper Arm Region  No depletion  Thoracic and Lumbar Region  No depletion  Buccal Region  No depletion  Temple Region  No depletion  Clavicle Bone Region  No depletion  Clavicle and Acromion Bone Region  No depletion  Scapular Bone Region  Unable to assess [pt in pain and unable to sit up]  Dorsal Hand  Mild depletion  Patellar Region  No depletion [edema]  Anterior Thigh Region  No depletion  Posterior Calf Region  No depletion  Edema (RD Assessment)  Severe  Hair  Reviewed  Eyes  Reviewed  Mouth  Reviewed  Skin  Reviewed  Nails  Reviewed  Diet Order:  Diet NPO time specified  EDUCATION NEEDS:   No education needs have been identified at this time  Skin:  Skin Assessment: Reviewed RN Assessment  Last BM:  2/24  Height:   Ht Readings from Last 1 Encounters:  10/15/17 5\' 9"  (1.753 m)    Weight:   Wt Readings from Last 1 Encounters:  10/16/17 233 lb 0.4 oz (105.7 kg)    Ideal Body Weight:  65.8 kg  BMI:  Body mass index is 34.41 kg/m.  Estimated Nutritional Needs:   Kcal:  2,200-2,400  Protein:  100-115 grams  Fluid:  2.2-2.4 L    Edmonia Lynch,  MS Dietetic Intern Pager: [phone]

## 2017-10-18 NOTE — Op Note (Signed)
[facility] Patient Name: Ann Larson Procedure Date: 10/18/2017 11:02 AM MRN: [id number] Account #: [id number] Date of Birth: 16-May-1956 Admit Type: Inpatient Age: 62 Room: [facility] ENDO ROOM 3 Gender: Female Note Status: Finalized Procedure:            Upper GI endoscopy Indications:          Acute post hemorrhagic anemia Providers:            [name] MD, MD Referring MD:         Meindert A. Brunetta Genera, MD (Referring MD) Medicines:            Monitored Anesthesia Care Complications:        No immediate complications. Procedure:            Pre-Anesthesia Assessment:                       - Prior to the procedure, a History and Physical was                        performed, and patient medications, allergies and                        sensitivities were reviewed. The patient's tolerance of                        previous anesthesia was reviewed.                       - The risks and benefits of the procedure and the                        sedation options and risks were discussed with the                        patient. All questions were answered and informed                        consent was obtained.                       - ASA Grade Assessment: III - A patient with severe                        systemic disease.                       After obtaining informed consent, the endoscope was                        passed under direct vision. Throughout the procedure,                        the patient's blood pressure, pulse, and oxygen                        saturations were monitored continuously. The Endoscope                        was introduced through the mouth, and advanced to the  third part of duodenum. The upper GI endoscopy was                        accomplished with ease. The patient tolerated the                        procedure well. Findings:      The esophagus was normal.      The examined duodenum was  normal.      One non-obstructing oozing cratered gastric ulcer of significant       severity with pigmented material was found on the greater curvature of       the gastric body. The lesion was 25 mm in largest dimension. There is no       evidence of perforation. Biopsies were taken with a cold forceps for       histology. Hemostasis achieved by application of hemospray powder. Impression:           - Normal esophagus.                       - Normal examined duodenum.                       - Non-obstructing oozing gastric ulcer with pigmented                        material. There is no evidence of perforation. Biopsied. Recommendation:       - Return patient to hospital ward for ongoing care.                       - NPO for 4 hours.                       - 1. After 4 hours can start on clears                       2. Ulcerated area in the stomach had a necrotic                        appearance, concerning for malignancy                       3. F/u biopsies                       4. The hemospray can halt blood loss from such lesions                        but at times a second application may be needed                       5. Monitor CBC and if there is further concerns for                        blood loss can consider a second application . Procedure Code(s):    --- Professional ---                       [phone], Esophagogastroduodenoscopy, flexible, transoral;  with biopsy, single or multiple Diagnosis Code(s):    --- Professional ---                       K25.4, Chronic or unspecified gastric ulcer with                        hemorrhage                       D62, Acute posthemorrhagic anemia CPT copyright 2016 American Medical Association. All rights reserved. The codes documented in this report are preliminary and upon coder review may  be revised to meet current compliance requirements. [name], MD [name] MD, MD 10/18/2017 11:40:47 AM This report  has been signed electronically. Number of Addenda: 0 Note Initiated On: 10/18/2017 11:02 AM      [facility]

## 2017-10-18 NOTE — Anesthesia Preprocedure Evaluation (Signed)
Anesthesia Evaluation  Patient identified by MRN, date of birth, ID band Patient awake    Reviewed: Allergy & Precautions, H&P , NPO status , Patient's Chart, lab work & pertinent test results, reviewed documented beta blocker date and time   Airway Mallampati: II   Neck ROM: full    Dental  (+) Poor Dentition   Pulmonary neg pulmonary ROS,    Pulmonary exam normal        Cardiovascular Exercise Tolerance: Poor hypertension, On Medications + Peripheral Vascular Disease  negative cardio ROS Normal cardiovascular exam Rhythm:regular Rate:Normal     Neuro/Psych negative neurological ROS  negative psych ROS   GI/Hepatic negative GI ROS, Neg liver ROS, GERD  Medicated,  Endo/Other  negative endocrine ROSdiabetesHypothyroidism Morbid obesity  Renal/GU negative Renal ROS  negative genitourinary   Musculoskeletal   Abdominal   Peds  Hematology negative hematology ROS (+) anemia ,   Anesthesia Other Findings Past Medical History: No date: Depression No date: Diabetes mellitus without complication (HCC) No date: Diverticulitis 08/29/2017: DVT (deep vein thrombosis) in pregnancy ([place])     Comment:  Right popliteal and femoral No date: GERD (gastroesophageal reflux disease) No date: Hyperlipidemia No date: Hypertension No date: Hypothyroidism No date: Obesity Past Surgical History: 2005: ABDOMINAL HYSTERECTOMY 2015: CHOLECYSTECTOMY 07/01/2015: COLONOSCOPY WITH PROPOFOL; N/A     Comment:  Procedure: COLONOSCOPY WITH PROPOFOL;  Surgeon: Josefine Class, MD;  Location: [facility] ENDOSCOPY;  Service:               Endoscopy;  Laterality: N/A; 07/01/2015: ESOPHAGOGASTRODUODENOSCOPY (EGD) WITH PROPOFOL; N/A     Comment:  Procedure: ESOPHAGOGASTRODUODENOSCOPY (EGD) WITH               PROPOFOL;  Surgeon: Josefine Class, MD;  Location:               [facility] ENDOSCOPY;  Service: Endoscopy;  Laterality:  N/A; 09/08/2017: ESOPHAGOGASTRODUODENOSCOPY (EGD) WITH PROPOFOL; N/A     Comment:  Procedure: ESOPHAGOGASTRODUODENOSCOPY (EGD) WITH               PROPOFOL;  Surgeon: Lucilla Lame, MD;  Location: [facility]               ENDOSCOPY;  Service: Endoscopy;  Laterality: N/A; 09/21/2017: ESOPHAGOGASTRODUODENOSCOPY (EGD) WITH PROPOFOL; N/A     Comment:  Procedure: ESOPHAGOGASTRODUODENOSCOPY (EGD) WITH               PROPOFOL;  Surgeon: Lucilla Lame, MD;  Location: [facility]               ENDOSCOPY;  Service: Endoscopy;  Laterality: N/A; 09/08/2017: IVC FILTER INSERTION; N/A     Comment:  Procedure: IVC FILTER INSERTION;  Surgeon: Algernon Huxley,              MD;  Location: [place] CV LAB;  Service:               Cardiovascular;  Laterality: N/A; BMI    Body Mass Index:  34.41 kg/m     Reproductive/Obstetrics negative OB ROS                             Anesthesia Physical Anesthesia Plan  ASA: III and emergent  Anesthesia Plan: General   Post-op Pain Management:    Induction:   PONV Risk Score and Plan:   Airway Management  Planned:   Additional Equipment:   Intra-op Plan:   Post-operative Plan:   Informed Consent: I have reviewed the patients History and Physical, chart, labs and discussed the procedure including the risks, benefits and alternatives for the proposed anesthesia with the patient or authorized representative who has indicated his/her understanding and acceptance.   Dental Advisory Given  Plan Discussed with: CRNA  Anesthesia Plan Comments:         Anesthesia Quick Evaluation

## 2017-10-18 NOTE — Anesthesia Post-op Follow-up Note (Signed)
Anesthesia QCDR form completed.        

## 2017-10-18 NOTE — Progress Notes (Signed)
Inpatient Diabetes Program Recommendations  AACE/ADA: New Consensus Statement on Inpatient Glycemic Control (2015)  Target Ranges:  Prepandial:   less than 140 mg/dL      Peak postprandial:   less than 180 mg/dL (1-2 hours)      Critically ill patients:  140 - 180 mg/dL   Lab Results  Component Value Date   GLUCAP 206 (H) 10/18/2017   HGBA1C 6.2 (H) 09/07/2017    Review of Glycemic Control  Results for Ann Larson, Ann Larson (MRN [id number]) as of 10/18/2017 11:02  Ref. Range 10/17/2017 05:52 10/17/2017 12:53 10/17/2017 18:07 10/18/2017 00:17 10/18/2017 05:26  Glucose-Capillary Latest Ref Range: 65 - 99 mg/dL 194 (H) 178 (H) 178 (H) 232 (H) 206 (H)   Diabetes history: Type 2 Outpatient Diabetes medications: Actos 45 mg qday, Toujeo 30 units qday  Current orders for Inpatient glycemic control: Novolog 0-9 units q6h   Inpatient Diabetes Program Recommendations:  Consider adding Lantus 11 units qhs (0.1 unit/kg).   Now that she has a diet, consider changing Novolog to 0-9 units tid and add Novolog 0-5 units qhs  Gentry Fitz, RN, [place], [place], CDE Diabetes Coordinator Inpatient Diabetes Program  [phone] (Team Pager) [phone] ([place]) 10/18/2017 11:04 AM

## 2017-10-18 NOTE — H&P (Signed)
[name], MD [address], [place], [place], [place], [address] [id number], [place], [place], [place], [address] Phone: [phone]  Fax: [phone]  Primary Care Physician:  Lorelee Market, MD   Pre-Procedure History & Physical: HPI:  Ann Larson is a 62 y.o. female is here for an endoscopy    Past Medical History:  Diagnosis Date  . Depression   . Diabetes mellitus without complication ([place])   . Diverticulitis   . DVT (deep vein thrombosis) in pregnancy ([place]) 08/29/2017   Right popliteal and femoral  . GERD (gastroesophageal reflux disease)   . Hyperlipidemia   . Hypertension   . Hypothyroidism   . Obesity     Past Surgical History:  Procedure Laterality Date  . ABDOMINAL HYSTERECTOMY  2005  . CHOLECYSTECTOMY  2015  . COLONOSCOPY WITH PROPOFOL N/A 07/01/2015   Procedure: COLONOSCOPY WITH PROPOFOL;  Surgeon: Josefine Class, MD;  Location: [facility] ENDOSCOPY;  Service: Endoscopy;  Laterality: N/A;  . ESOPHAGOGASTRODUODENOSCOPY (EGD) WITH PROPOFOL N/A 07/01/2015   Procedure: ESOPHAGOGASTRODUODENOSCOPY (EGD) WITH PROPOFOL;  Surgeon: Josefine Class, MD;  Location: [facility] ENDOSCOPY;  Service: Endoscopy;  Laterality: N/A;  . ESOPHAGOGASTRODUODENOSCOPY (EGD) WITH PROPOFOL N/A 09/08/2017   Procedure: ESOPHAGOGASTRODUODENOSCOPY (EGD) WITH PROPOFOL;  Surgeon: Lucilla Lame, MD;  Location: [facility] ENDOSCOPY;  Service: Endoscopy;  Laterality: N/A;  . ESOPHAGOGASTRODUODENOSCOPY (EGD) WITH PROPOFOL N/A 09/21/2017   Procedure: ESOPHAGOGASTRODUODENOSCOPY (EGD) WITH PROPOFOL;  Surgeon: Lucilla Lame, MD;  Location: [facility] ENDOSCOPY;  Service: Endoscopy;  Laterality: N/A;  . IVC FILTER INSERTION N/A 09/08/2017   Procedure: IVC FILTER INSERTION;  Surgeon: Algernon Huxley, MD;  Location: [place] [facility] LAB;  Service: Cardiovascular;  Laterality: N/A;    Prior to Admission medications   Medication Sig Start Date End Date Taking? Authorizing Provider  ferrous sulfate 325  (65 FE) MG EC tablet Take 1 tablet (325 mg total) by mouth 2 (two) times daily. 09/09/17 11/08/17 Yes Sudini, Alveta Heimlich, MD  levothyroxine (SYNTHROID, LEVOTHROID) 125 MCG tablet Take 125 mcg by mouth daily before breakfast.   Yes [provider]  lisinopril (PRINIVIL,ZESTRIL) 20 MG tablet Take 20 mg by mouth daily. 07/04/17  Yes [provider]  metoprolol succinate (TOPROL-XL) 100 MG 24 hr tablet Take 50 mg by mouth daily. Take with or immediately following a meal.    Yes [provider]  omeprazole (PRILOSEC) 40 MG capsule Take 1 capsule (40 mg total) by mouth 2 (two) times daily before a meal. 09/09/17  Yes Sudini, Alveta Heimlich, MD  oxyCODONE-acetaminophen (PERCOCET/ROXICET) 5-325 MG tablet Take 1 tablet by mouth every 4 (four) hours as needed for severe pain. 09/23/17  Yes Darel Hong, MD  pioglitazone (ACTOS) 45 MG tablet Take 45 mg by mouth daily. 06/18/17  Yes [provider]  simvastatin (ZOCOR) 20 MG tablet Take 20 mg by mouth daily.   Yes [provider]  sucralfate (CARAFATE) 1 g tablet Take 1 tablet (1 g total) by mouth 4 (four) times daily. 10/09/17  Yes Lucilla Lame, MD  tiZANidine (ZANAFLEX) 4 MG tablet Take 4 mg by mouth 3 (three) times daily.   Yes [provider]  TOUJEO SOLOSTAR 300 UNIT/ML SOPN Inject 30 Units into the skin daily.  09/05/17  Yes [provider]  venlafaxine XR (EFFEXOR-XR) 75 MG 24 hr capsule Take 75 mg by mouth daily with breakfast.   Yes [provider]    Allergies as of 10/15/2017 - Review Complete 10/15/2017  Allergen Reaction Noted  .  Sulfa antibiotics  06/30/2015    Family History  Problem Relation Age of Onset  . Deep vein thrombosis Neg Hx     Social History   Socioeconomic History  . Marital status: Married    Spouse name: Not on file  . Number of children: Not on file  . Years of education: Not on file  . Highest education level: Not on file  Social Needs  . Financial resource  strain: Not on file  . Food insecurity - worry: Not on file  . Food insecurity - inability: Not on file  . Transportation needs - medical: Not on file  . Transportation needs - non-medical: Not on file  Occupational History  . Not on file  Tobacco Use  . Smoking status: Never Smoker  . Smokeless tobacco: Never Used  Substance and Sexual Activity  . Alcohol use: No  . Drug use: No  . Sexual activity: Not on file  Other Topics Concern  . Not on file  Social History Narrative  . Not on file    Review of Systems: See HPI, otherwise negative ROS  Physical Exam: BP 132/69 (BP Location: Left Arm)   Pulse (!) 105   Temp 97.8 F (36.6 C) (Oral)   Resp 20   Ht 5\' 9"  (1.753 m)   Wt 233 lb 0.4 oz (105.7 kg)   SpO2 96%   BMI 34.41 kg/m  General:   Alert,  pleasant and cooperative in NAD Head:  Normocephalic and atraumatic. Neck:  Supple; no masses or thyromegaly. Lungs:  Clear throughout to auscultation, normal respiratory effort.    Heart:  +S1, +S2, Regular rate and rhythm, No edema. Abdomen:  Soft, nontender and nondistended. Normal bowel sounds, without guarding, and without rebound.   Neurologic:  Alert and  oriented x4;  grossly normal neurologically.  Impression/Plan: Ann Larson is here for an endoscopy  to be performed for  evaluation of GI bleed.     Risks, benefits, limitations, and alternatives regarding endoscopy have been reviewed with the patient.  Questions have been answered.  All parties agreeable.   [name], MD  10/18/2017, 10:40 AM

## 2017-10-18 NOTE — Transfer of Care (Signed)
Immediate Anesthesia Transfer of Care Note  Patient: Ann Larson  Procedure(s) Performed: ESOPHAGOGASTRODUODENOSCOPY (EGD) WITH PROPOFOL (N/A )  Patient Location: PACU  Anesthesia Type:General  Level of Consciousness: drowsy and patient cooperative  Airway & Oxygen Therapy: Patient Spontanous Breathing and Patient connected to nasal cannula oxygen  Post-op Assessment: Report given to RN, Post -op Vital signs reviewed and stable and Patient moving all extremities X 4  Post vital signs: Reviewed and stable  Last Vitals:  Vitals:   10/18/17 0840 10/18/17 1041  BP: 132/69 130/63  Pulse: (!) 105 (!) 110  Resp: 20 16  Temp: 36.6 C 36.8 C  SpO2: 96% 95%    Last Pain:  Vitals:   10/18/17 1041  TempSrc: Tympanic  PainSc: 8          Complications: No apparent anesthesia complications

## 2017-10-18 NOTE — Progress Notes (Signed)
Patient unavailable for FSBS

## 2017-10-18 NOTE — Anesthesia Postprocedure Evaluation (Signed)
Anesthesia Post Note  Patient: Ann Larson  Procedure(s) Performed: ESOPHAGOGASTRODUODENOSCOPY (EGD) WITH PROPOFOL (N/A )  Patient location during evaluation: Endoscopy Anesthesia Type: General Level of consciousness: awake and alert, patient cooperative and oriented Pain management: satisfactory to patient Vital Signs Assessment: post-procedure vital signs reviewed and stable Respiratory status: spontaneous breathing and respiratory function stable Cardiovascular status: blood pressure returned to baseline Postop Assessment: no headache, no backache, no apparent nausea or vomiting and adequate PO intake Anesthetic complications: no     Last Vitals:  Vitals:   10/18/17 0840 10/18/17 1041  BP: 132/69 130/63  Pulse: (!) 105 (!) 110  Resp: 20 16  Temp: 36.6 C 36.8 C  SpO2: 96% 95%    Last Pain:  Vitals:   10/18/17 1041  TempSrc: Tympanic  PainSc: [place] Zakhia Seres

## 2017-10-19 ENCOUNTER — Encounter: Payer: Self-pay | Admitting: Gastroenterology

## 2017-10-19 LAB — BASIC METABOLIC PANEL
ANION GAP: 10 (ref 5–15)
BUN: 41 mg/dL — ABNORMAL HIGH (ref 6–20)
CALCIUM: 11.4 mg/dL — AB (ref 8.9–10.3)
CO2: 20 mmol/L — ABNORMAL LOW (ref 22–32)
Chloride: 107 mmol/L (ref 101–111)
Creatinine, Ser: 1.45 mg/dL — ABNORMAL HIGH (ref 0.44–1.00)
GFR calc Af Amer: 44 mL/min — ABNORMAL LOW (ref >60)
GFR calc non Af Amer: 38 mL/min — ABNORMAL LOW (ref >60)
GLUCOSE: 219 mg/dL — AB (ref 65–99)
POTASSIUM: 4.5 mmol/L (ref 3.5–5.1)
Sodium: 137 mmol/L (ref 135–145)

## 2017-10-19 LAB — SURGICAL PATHOLOGY

## 2017-10-19 LAB — CBC
HEMATOCRIT: 24.6 % — AB (ref 35.0–47.0)
Hemoglobin: 7.8 g/dL — ABNORMAL LOW (ref 12.0–16.0)
MCH: 27.8 pg (ref 26.0–34.0)
MCHC: 31.6 g/dL — ABNORMAL LOW (ref 32.0–36.0)
MCV: 88 fL (ref 80.0–100.0)
Platelets: 237 10*3/uL (ref 150–440)
RBC: 2.8 MIL/uL — ABNORMAL LOW (ref 3.80–5.20)
RDW: 16.8 % — AB (ref 11.5–14.5)
WBC: 14.9 10*3/uL — AB (ref 3.6–11.0)

## 2017-10-19 LAB — GLUCOSE, CAPILLARY
GLUCOSE-CAPILLARY: 226 mg/dL — AB (ref 65–99)
GLUCOSE-CAPILLARY: 180 mg/dL — AB (ref 65–99)
Glucose-Capillary: 232 mg/dL — ABNORMAL HIGH (ref 65–99)
Glucose-Capillary: 196 mg/dL — ABNORMAL HIGH (ref 65–99)

## 2017-10-19 LAB — PT FACTOR INHIBITOR (MIXING STUDY)
1 HR INCUB PT 1:1NP: 12 s — ABNORMAL HIGH (ref 9.6–11.5)
PT 1:1NP: 11.4 s (ref 9.6–11.5)
PT: 13.1 s — ABNORMAL HIGH (ref 9.6–11.5)

## 2017-10-19 MED ORDER — OXYCODONE HCL 5 MG PO TABS
5.0000 mg | ORAL_TABLET | ORAL | Status: DC | PRN
  Administered 2017-10-19 – 2017-10-20 (×8): 5 mg via ORAL
  Filled 2017-10-19 (×8): qty 1

## 2017-10-19 MED ORDER — SORBITOL 70 % SOLN
960.0000 mL | TOPICAL_OIL | Freq: Once | ORAL | Status: DC
  Filled 2017-10-19: qty 473

## 2017-10-19 MED ORDER — BISACODYL 10 MG RE SUPP
10.0000 mg | Freq: Every day | RECTAL | Status: DC | PRN
  Administered 2017-10-20: 10 mg via RECTAL
  Filled 2017-10-19: qty 1

## 2017-10-19 MED ORDER — INSULIN GLARGINE 100 UNIT/ML ~~LOC~~ SOLN
13.0000 [IU] | Freq: Every day | SUBCUTANEOUS | Status: DC
  Administered 2017-10-19 – 2017-10-20 (×2): 13 [IU] via SUBCUTANEOUS
  Filled 2017-10-19 (×3): qty 0.13

## 2017-10-19 MED ORDER — MAGNESIUM HYDROXIDE 400 MG/5ML PO SUSP
30.0000 mL | Freq: Every day | ORAL | Status: DC | PRN
  Administered 2017-10-19 – 2017-10-20 (×2): 30 mL via ORAL
  Filled 2017-10-19: qty 30

## 2017-10-19 NOTE — Progress Notes (Signed)
Chap. Rounding . Pt follow up. Pt and husband together. Pt was in pain and asked Ch to come back.    10/19/17 1000  Clinical Encounter Type  Visited With Patient  Visit Type Follow-up  Referral From Chaplain  Spiritual Encounters  Spiritual Needs Emotional

## 2017-10-19 NOTE — Progress Notes (Signed)
Patient asleep and resting comfortably upon arrival to room. No distress noted. Advised she did not want to wear hospital CPAP at this time. Home CPAP not in patient room at this time. Stated she will call RT when/if needed.

## 2017-10-19 NOTE — Progress Notes (Addendum)
Inpatient Diabetes Program Recommendations  AACE/ADA: New Consensus Statement on Inpatient Glycemic Control (2015)  Target Ranges:  Prepandial:   less than 140 mg/dL      Peak postprandial:   less than 180 mg/dL (1-2 hours)      Critically ill patients:  140 - 180 mg/dL   Lab Results  Component Value Date   GLUCAP 232 (H) 10/19/2017   HGBA1C 6.2 (H) 09/07/2017    Review of Glycemic Control  Results for Ann Larson, Ann Larson (MRN [id number]) as of 10/19/2017 11:57  Ref. Range 10/18/2017 00:17 10/18/2017 05:26 10/18/2017 17:10 10/18/2017 21:38 10/19/2017 08:02  Glucose-Capillary Latest Ref Range: 65 - 99 mg/dL 232 (H) 206 (H) 216 (H) 189 (H) 232 (H)   Diabetes history: Type 2 Outpatient Diabetes medications: Actos 45 mg qday, Toujeo 30 units qday  Current orders for Inpatient glycemic control: Novolog 0-9 units tid, Novolog 0-5 units qhs, Lantus 11 units qhs  Inpatient Diabetes Program Recommendations:  Consider increasing Lantus 24 units qhs (20 % reduction of home dose)- fasting CBG 232mg /dl  Gentry Fitz, RN, BA, [place], CDE Diabetes Coordinator Inpatient Diabetes Program  [phone] (Team Pager) [phone] ([place]) 10/19/2017 11:59 AM

## 2017-10-19 NOTE — Progress Notes (Signed)
Patient ID: Ann Larson, female   DOB: 1955-10-19, 62 y.o.   MRN: [id number]  [facility] PROGRESS NOTE  Ann Larson SVX:[id number] DOB: October 09, 1955 DOA: 10/15/2017 PCP: Lorelee Market, MD  HPI/Subjective: Still complains of right leg pain but better with oxycodone, patient requesting lower dose of oxycodone, looks like she got 10 mg of oxycodone and she says its a bit high dose and requesting oxycodone at 5 mg.   No Further abdominal pain.   Objective: Vitals:   10/18/17 1934 10/19/17 0322  BP: (!) 126/53 (!) 112/53  Pulse: (!) 108 (!) 102  Resp: 15 17  Temp: 99 F (37.2 C) 98.9 F (37.2 C)  SpO2: 96% 93%    Filed Weights   10/15/17 2217 10/15/17 2218 10/16/17 0149  Weight: 102.5 kg (226 lb) 107.2 kg (236 lb 5.3 oz) 105.7 kg (233 lb 0.4 oz)    ROS: Review of Systems  Constitutional: Negative for chills and fever.  Eyes: Negative for blurred vision.  Respiratory: Negative for cough and shortness of breath.   Cardiovascular: Negative for chest pain.  Gastrointestinal: Negative for abdominal pain, constipation, diarrhea, nausea and vomiting.  Genitourinary: Negative for dysuria.  Musculoskeletal: Negative for joint pain.  Neurological: Negative for dizziness and headaches.   Exam: Physical Exam  Constitutional: She is oriented to person, place, and time.  HENT:  Nose: No mucosal edema.  Mouth/Throat: No oropharyngeal exudate or posterior oropharyngeal edema.  Eyes: Conjunctivae, EOM and lids are normal. Pupils are equal, round, and reactive to light.  Neck: No JVD present. Carotid bruit is not present. No edema present. No thyroid mass and no thyromegaly present.  Cardiovascular: S1 normal and S2 normal. Exam reveals no gallop.  No murmur heard. Pulses:      Dorsalis pedis pulses are 2+ on the right side, and 2+ on the left side.  Respiratory: No respiratory distress. She has no wheezes. She has no rhonchi. She has no rales.  GI: Soft. Bowel sounds  are normal. There is no tenderness.  Musculoskeletal:       Right ankle: She exhibits swelling.       Left ankle: She exhibits swelling.  Lymphadenopathy:    She has no cervical adenopathy.  Neurological: She is alert and oriented to person, place, and time. No cranial nerve deficit.  Skin: Skin is warm. No rash noted. Nails show no clubbing.  Psychiatric: She has a normal mood and affect.      Data Reviewed: Basic Metabolic Panel: Recent Labs  Lab 10/15/17 2230 10/16/17 0846 10/17/17 0453 10/18/17 0311 10/19/17 0719  NA 134* 137 137 137 137  K 4.1 4.2 4.4 4.5 4.5  CL 102 105 106 107 107  CO2 24 22 20* 21* 20*  GLUCOSE 241* 176* 190* 210* 219*  BUN 18 22* 25* 35* 41*  CREATININE 1.08* 1.18* 1.04* 1.37* 1.45*  CALCIUM 10.2 10.3 11.1* 11.0* 11.4*   Liver Function Tests: Recent Labs  Lab 10/15/17 2230  AST 33  ALT 20  ALKPHOS 85  BILITOT 0.2*  PROT 7.6  ALBUMIN 3.1*   Recent Labs  Lab 10/15/17 2230  LIPASE 26   CBC: Recent Labs  Lab 10/15/17 2230 10/16/17 0846 10/16/17 1615 10/16/17 2135 10/17/17 0453 10/18/17 0311 10/19/17 0719  WBC 11.5* 11.7*  --   --  11.3* 12.9* 14.9*  NEUTROABS 7.9*  --   --   --   --   --   --   HGB 6.6* 8.8* 8.9* 9.0*  8.8* 8.1* 7.8*  HCT 21.6* 27.6* 28.0* 27.6* 27.4* 25.3* 24.6*  MCV 90.0 87.2  --   --  88.6 87.6 88.0  PLT 233 219  --   --  194 212 237   Cardiac Enzymes: Recent Labs  Lab 10/15/17 2230  TROPONINI <0.03    CBG: Recent Labs  Lab 10/18/17 0526 10/18/17 1710 10/18/17 2138 10/19/17 0802 10/19/17 1154  GLUCAP 206* 216* 189* 232* 226*      Studies: Ct Biopsy  Result Date: 10-20-17 INDICATION: Right lower quadrant omental mass, unknown primary EXAM: CT-GUIDED BIOPSY RIGHT LOWER QUADRANT OMENTAL MASS MEDICATIONS: 1% LIDOCAINE LOCAL ANESTHESIA/SEDATION: 2.0 mg IV Versed; 75 mcg IV Fentanyl Moderate Sedation Time:  11 MINUTES The patient was continuously monitored during the procedure by the  interventional radiology nurse under my direct supervision. PROCEDURE: The procedure, risks, benefits, and alternatives were explained to the patient. Questions regarding the procedure were encouraged and answered. The patient understands and consents to the procedure. Previous imaging reviewed. Patient positioned supine. Noncontrast localization CT performed. The right lower quadrant omental mass anterior to the cecum was localized. Overlying skin marked. Under sterile conditions and local anesthesia, a 17 gauge 6.8 cm access needle was advanced from a lateral approach to the right lower quadrant omental mass. Needle position confirmed with CT. 18 gauge core biopsies obtained. Samples placed in formalin. Needle removed. Postprocedure imaging demonstrates no hemorrhage or hematoma. Patient tolerated the procedure well without complication. Vital sign monitoring by nursing staff during the procedure will continue as patient is in the special procedures unit for post procedure observation. FINDINGS: The images document guide needle placement within the right lower quadrant omental mass. Post biopsy images demonstrate no hemorrhage or hematoma. COMPLICATIONS: None immediate. IMPRESSION: Successful CT-guided core biopsy of the right lower quadrant omental mass Electronically Signed   By: Jerilynn Mages.  Shick M.D.   On: 10-20-17 13:14    Scheduled Meds: . feeding supplement (GLUCERNA SHAKE)  237 mL Oral BID BM  . insulin aspart  0-5 Units Subcutaneous QHS  . insulin aspart  0-9 Units Subcutaneous TID WC  . insulin glargine  11 Units Subcutaneous QHS  . levothyroxine  125 mcg Oral QAC breakfast  . pantoprazole  40 mg Intravenous Q12H  . protein supplement shake  11 oz Oral Q24H  . simvastatin  20 mg Oral Daily  . venlafaxine XR  75 mg Oral Q breakfast   Continuous Infusions: . methocarbamol (ROBAXIN)  IV Stopped (10/16/17 4782)    Assessment/Plan:  1. Symptomatic anemia with epigastric abdominal pain and GI  bleed.  Patient had endoscopy today showing ulcer that was oozing.  Received homeless break.  Hemoglobin stable at this time. 2. 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  Still awaiting biopsies to come back.  Seen by oncology.  Patient needs outpatient PET scan.  Likely metastases in lung, liver, patient had old omental biopsy days ago.  Follow-up biopsy results. 3. DVT bilateral lower extremity unable to give anticoagulation with GI bleed.  Patient has IVC filter.  Pain control with oxycodone.  She states that this helped her the most.,  Continue oxycodone, out of bed to chair as much as possible. 4. Type 2 diabetes mellitus ; uncontrolled.  Adjusted the dose of Lantus.   5. Hypothyroidism unspecified on levothyroxine 6. Hyperlipidemia unspecified on simvastatin 7. Depression on Effexor deCondition: Physical therapy consult. Code Status:     Code Status Orders  (From admission, onward)  Start     Ordered   10/16/17 0027  Full code  Continuous     10/16/17 0026    Code Status History    Date Active Date Inactive Code Status Order ID Comments User Context   09/07/2017 16:09 09/09/2017 16:37 Full Code [id number]  Hillary Bow, MD ED     Family Communication: Permission to speak medically in front of everybody in the room Disposition Plan: To be determined  Consultants:  Oncology  Gastroenterology  Procedures:  Endoscopy  Time spent: 30 minutes  [facility]

## 2017-10-19 NOTE — Progress Notes (Signed)
Hematology/Oncology Progress Note [facility] [phone] [phone]  Patient Care Team: Lorelee Market, MD as PCP - General (Family Medicine)   Name of the patient: Ann Larson  [id number]  Mar 14, 1963  Date of visit: 10/19/17   INTERVAL HISTORY-  Patient was seen and evaluated at bedside. She feels that lower extremity pain is better controlled with current regimen. No new complaints.     Review of systems- Review of Systems  Constitutional: Positive for malaise/fatigue. Negative for chills and fever.  HENT: Negative for nosebleeds.   Eyes: Negative for photophobia.  Respiratory: Negative for cough and hemoptysis.   Cardiovascular: Negative for chest pain.  Gastrointestinal: Positive for abdominal pain and blood in stool. Negative for nausea and vomiting.  Genitourinary: Negative for dysuria.  Musculoskeletal:       Bilateral lower extremities pain.   Skin: Negative for rash.  Neurological: Positive for weakness. Negative for dizziness.  Psychiatric/Behavioral: The patient is nervous/anxious.     Allergies  Allergen Reactions  . Sulfa Antibiotics     Patient Active Problem List   Diagnosis Date Noted  . Multiple lesions of metastatic malignancy ([place]) 10/16/2017  . Generalized abdominal pain   . Omental mass   . Pancreatic mass   . Hypothyroidism 10/15/2017  . Diabetes ([place]) 10/15/2017  . Hypertension 10/05/2017  . Deep vein thrombosis (DVT) of right lower extremity ([place]) 09/26/2017  . Personal history of peptic ulcer disease   . Gastritis with hemorrhage   . Symptomatic anemia   . Heme + stool   . Acute GI bleeding   . GI bleed 09/07/2017     Past Medical History:  Diagnosis Date  . Depression   . Diabetes mellitus without complication ([place])   . Diverticulitis   . DVT (deep vein thrombosis) in pregnancy ([place]) 08/29/2017   Right popliteal and femoral  . GERD (gastroesophageal reflux disease)   .  Hyperlipidemia   . Hypertension   . Hypothyroidism   . Obesity      Past Surgical History:  Procedure Laterality Date  . ABDOMINAL HYSTERECTOMY  2005  . CHOLECYSTECTOMY  2015  . COLONOSCOPY WITH PROPOFOL N/A 07/01/2015   Procedure: COLONOSCOPY WITH PROPOFOL;  Surgeon: Josefine Class, MD;  Location: [facility] ENDOSCOPY;  Service: Endoscopy;  Laterality: N/A;  . ESOPHAGOGASTRODUODENOSCOPY (EGD) WITH PROPOFOL N/A 07/01/2015   Procedure: ESOPHAGOGASTRODUODENOSCOPY (EGD) WITH PROPOFOL;  Surgeon: Josefine Class, MD;  Location[facility] ENDOSCOPY;  Service: Endoscopy;  Laterality: N/A;  . ESOPHAGOGASTRODUODENOSCOPY (EGD) WITH PROPOFOL N/A 09/08/2017   Procedure: ESOPHAGOGASTRODUODENOSCOPY (EGD) WITH PROPOFOL;  Surgeon: Lucilla Lame, MD;  Location: [facility] ENDOSCOPY;  Service: Endoscopy;  Laterality: N/A;  . ESOPHAGOGASTRODUODENOSCOPY (EGD) WITH PROPOFOL N/A 09/21/2017   Procedure: ESOPHAGOGASTRODUODENOSCOPY (EGD) WITH PROPOFOL;  Surgeon: Lucilla Lame, MD;  Location: [facility] ENDOSCOPY;  Service: Endoscopy;  Laterality: N/A;  . ESOPHAGOGASTRODUODENOSCOPY (EGD) WITH PROPOFOL N/A 10/18/2017   Procedure: ESOPHAGOGASTRODUODENOSCOPY (EGD) WITH PROPOFOL;  Surgeon: [name], MD;  Location: [facility] ENDOSCOPY;  Service: Gastroenterology;  Laterality: N/A;  . IVC FILTER INSERTION N/A 09/08/2017   Procedure: IVC FILTER INSERTION;  Surgeon: Algernon Huxley, MD;  Location: [place] [facility] LAB;  Service: Cardiovascular;  Laterality: N/A;    Social History   Socioeconomic History  . Marital status: Married    Spouse name: Not on file  . Number of children: Not on file  . Years of education: Not on file  . Highest education level: Not on file  Social Needs  . Emergency planning/management officer  strain: Not on file  . Food insecurity - worry: Not on file  . Food insecurity - inability: Not on file  . Transportation needs - medical: Not on file  . Transportation needs - non-medical: Not on file  Occupational History  . Not on file    Tobacco Use  . Smoking status: Never Smoker  . Smokeless tobacco: Never Used  Substance and Sexual Activity  . Alcohol use: No  . Drug use: No  . Sexual activity: Not on file  Other Topics Concern  . Not on file  Social History Narrative  . Not on file     Family History  Problem Relation Age of Onset  . Deep vein thrombosis Neg Hx      Current Facility-Administered Medications:  .  acetaminophen (TYLENOL) tablet 650 mg, 650 mg, Oral, Q6H PRN **OR** acetaminophen (TYLENOL) suppository 650 mg, 650 mg, Rectal, Q6H PRN, Lance Coon, MD .  bisacodyl (DULCOLAX) suppository 10 mg, 10 mg, Rectal, Daily PRN, Epifanio Lesches, MD .  feeding supplement (GLUCERNA SHAKE) (GLUCERNA SHAKE) liquid 237 mL, 237 mL, Oral, BID BM, Gouru, Aruna, MD .  HYDROmorphone (DILAUDID) tablet 1 mg, 1 mg, Oral, Q6H PRN, Wieting, Richard, MD .  insulin aspart (novoLOG) injection 0-5 Units, 0-5 Units, Subcutaneous, QHS, Wieting, Richard, MD .  insulin aspart (novoLOG) injection 0-9 Units, 0-9 Units, Subcutaneous, TID WC, Loletha Grayer, MD, 3 Units at 10/19/17 1236 .  insulin glargine (LANTUS) injection 13 Units, 13 Units, Subcutaneous, QHS, Epifanio Lesches, MD .  levothyroxine (SYNTHROID, LEVOTHROID) tablet 125 mcg, 125 mcg, Oral, QAC breakfast, Lance Coon, MD, 125 mcg at 10/19/17 0501 .  magnesium hydroxide (MILK OF MAGNESIA) suspension 30 mL, 30 mL, Oral, Daily PRN, Epifanio Lesches, MD, 30 mL at 10/19/17 0944 .  methocarbamol (ROBAXIN) 500 mg in dextrose 5 % 50 mL IVPB, 500 mg, Intravenous, Q6H PRN, Lance Coon, MD, Stopped at 10/16/17 [phone]  morphine 2 MG/ML injection 2 mg, 2 mg, Intravenous, Q4H PRN, Lance Coon, MD, 2 mg at 10/18/17 1348 .  ondansetron (ZOFRAN) tablet 4 mg, 4 mg, Oral, Q6H PRN **OR** ondansetron (ZOFRAN) injection 4 mg, 4 mg, Intravenous, Q6H PRN, Lance Coon, MD, 4 mg at 10/17/17 3785 .  oxyCODONE (Oxy IR/ROXICODONE) immediate release tablet 5 mg, 5 mg, Oral, Q4H  PRN, Epifanio Lesches, MD, 5 mg at 10/19/17 2016 .  pantoprazole (PROTONIX) injection 40 mg, 40 mg, Intravenous, Q12H, Lance Coon, MD, 40 mg at 10/19/17 1547 .  protein supplement (PREMIER PROTEIN) liquid, 11 oz, Oral, Q24H, Gouru, Aruna, MD .  simvastatin (ZOCOR) tablet 20 mg, 20 mg, Oral, Daily, Lance Coon, MD, 20 mg at 10/18/17 1658 .  traZODone (DESYREL) tablet 25 mg, 25 mg, Oral, QHS PRN, Lance Coon, MD, 25 mg at 10/17/17 0026 .  venlafaxine XR (EFFEXOR-XR) 24 hr capsule 75 mg, 75 mg, Oral, Q breakfast, Lance Coon, MD, 75 mg at 10/19/17 8850   Physical exam:  Vitals:   10/18/17 1934 10/19/17 0322 10/19/17 1725 10/19/17 1928  BP: (!) 126/53 (!) 112/53 126/60 (!) 125/51  Pulse: (!) 108 (!) 102 100 (!) 106  Resp: 15 17 18 16   Temp: 99 F (37.2 C) 98.9 F (37.2 C) 97.9 F (36.6 C) 98.6 F (37 C)  TempSrc: Oral Oral Oral Oral  SpO2: 96% 93% 100% 95%  Weight:      Height:       GENERAL:Alert, no distress and comfortable.  EYES: +  Pallor, non icterus OROPHARYNX: no thrush or ulceration;  good dentition  NECK: supple, no masses felt LUNGS: clear to auscultation and  No wheeze or crackles HEART/CVS: regular rate & rhythm and no murmurs; No lower extremity edema ABDOMEN: abdomen soft, she feels diffuse tenderness with palpation,  Has normal bowel sounds Musculoskeletal:no cyanosis of digits and no clubbing. Lower extremities tenderness.  PSYCH: alert & oriented x 3  NEURO: no focal motor/sensory deficits SKIN:  no rashes or significant lesions      CMP Latest Ref Rng & Units 10/19/2017  Glucose 65 - 99 mg/dL 219(H)  BUN 6 - 20 mg/dL 41(H)  Creatinine 0.44 - 1.00 mg/dL 1.45(H)  Sodium 135 - 145 mmol/L 137  Potassium 3.5 - 5.1 mmol/L 4.5  Chloride 101 - 111 mmol/L 107  CO2 22 - 32 mmol/L 20(L)  Calcium 8.9 - 10.3 mg/dL 11.4(H)  Total Protein 6.5 - 8.1 g/dL -  Total Bilirubin 0.3 - 1.2 mg/dL -  Alkaline Phos 38 - 126 U/L -  AST 15 - 41 U/L -  ALT 14 - 54 U/L  -   CBC Latest Ref Rng & Units 10/19/2017  WBC 3.6 - 11.0 K/uL 14.9(H)  Hemoglobin 12.0 - 16.0 g/dL 7.8(L)  Hematocrit 35.0 - 47.0 % 24.6(L)  Platelets 150 - 440 K/uL 237      Assessment and plan- Patient is a 62 y.o. female with history of hemorraghic gastritis, extensive bilateral lower extremity DVT, presents with symptomatic anemia, hemoglobin 6.6, abdominal pain with abnormal CT findings.   # metastatic disease,CA 19.9 significantly elevated at 124491, compatible with metastatic pancreatic cancer,  involving lung, liver, omental caking. Outpatient PET scan. She can follow up with me outpatient to start chemotherapy. Discussed with patient.   # symptomatic anemia, continue monitor, keep Hemoglobin above 7. S/p EGD which revealed bleeding ulcer, treated with homospray.   # Extensive bilateral  lower extremity DVT: anticoagulation is contraindicated at this point due to ongoing blood loss. DVT maybe related to her metastatic tumor burden.  vascular surgeon recommendation noted. No intervention at this point.    Earlie Server, MD, PhD Hematology Oncology [facility] Pager- [phone] 10/19/2017

## 2017-10-20 LAB — GLUCOSE, CAPILLARY
GLUCOSE-CAPILLARY: 177 mg/dL — AB (ref 65–99)
GLUCOSE-CAPILLARY: 184 mg/dL — AB (ref 65–99)
GLUCOSE-CAPILLARY: 159 mg/dL — AB (ref 65–99)

## 2017-10-20 LAB — CBC
HEMATOCRIT: 25 % — AB (ref 35.0–47.0)
Hemoglobin: 7.8 g/dL — ABNORMAL LOW (ref 12.0–16.0)
MCH: 27.6 pg (ref 26.0–34.0)
MCHC: 31.4 g/dL — ABNORMAL LOW (ref 32.0–36.0)
MCV: 88 fL (ref 80.0–100.0)
PLATELETS: 250 10*3/uL (ref 150–440)
RBC: 2.84 MIL/uL — ABNORMAL LOW (ref 3.80–5.20)
RDW: 16.6 % — ABNORMAL HIGH (ref 11.5–14.5)
WBC: 12.9 10*3/uL — ABNORMAL HIGH (ref 3.6–11.0)

## 2017-10-20 MED ORDER — LACTULOSE 10 GM/15ML PO SOLN
30.0000 g | Freq: Four times a day (QID) | ORAL | Status: DC | PRN
  Administered 2017-10-20: 30 g via ORAL
  Filled 2017-10-20: qty 60

## 2017-10-20 MED ORDER — HYDROMORPHONE HCL 2 MG PO TABS
1.0000 mg | ORAL_TABLET | Freq: Four times a day (QID) | ORAL | Status: DC
  Administered 2017-10-20 – 2017-10-21 (×4): 1 mg via ORAL
  Filled 2017-10-20 (×4): qty 1

## 2017-10-20 NOTE — Progress Notes (Signed)
Patient ID: Ann Larson, female   DOB: Jun 13, 1956, 62 y.o.   MRN: [id number]  [facility] [employer] PROGRESS NOTE  Ann Larson OEV:[id number] DOB: Jan 07, 1956 DOA: 10/15/2017 PCP: Lorelee Market, MD  HPI/Subjective:  Left leg pain slightly better but not controlled yet.  Pathology results of omentum showed metastatic cancer.  Told the patient about that.   Objective: Vitals:   10/20/17 0357 10/20/17 0748  BP: (!) 114/56 119/66  Pulse: (!) 102 99  Resp: 16 16  Temp: 98.2 F (36.8 C) 97.8 F (36.6 C)  SpO2: 96% 98%    Filed Weights   10/15/17 2217 10/15/17 2218 10/16/17 0149  Weight: 102.5 kg (226 lb) 107.2 kg (236 lb 5.3 oz) 105.7 kg (233 lb 0.4 oz)    ROS: Review of Systems  Constitutional: Negative for chills and fever.  Eyes: Negative for blurred vision.  Respiratory: Negative for cough and shortness of breath.   Cardiovascular: Negative for chest pain.  Gastrointestinal: Negative for abdominal pain, constipation, diarrhea, nausea and vomiting.  Genitourinary: Negative for dysuria.  Musculoskeletal: Negative for joint pain.  Neurological: Negative for dizziness and headaches.   Exam: Physical Exam  Constitutional: She is oriented to person, place, and time.  HENT:  Nose: No mucosal edema.  Mouth/Throat: No oropharyngeal exudate or posterior oropharyngeal edema.  Eyes: Conjunctivae, EOM and lids are normal. Pupils are equal, round, and reactive to light.  Neck: No JVD present. Carotid bruit is not present. No edema present. No thyroid mass and no thyromegaly present.  Cardiovascular: S1 normal and S2 normal. Exam reveals no gallop.  No murmur heard. Pulses:      Dorsalis pedis pulses are 2+ on the right side, and 2+ on the left side.  Respiratory: No respiratory distress. She has no wheezes. She has no rhonchi. She has no rales.  GI: Soft. Bowel sounds are normal. There is no tenderness.  Musculoskeletal:       Right ankle: She exhibits swelling.   Left ankle: She exhibits swelling.  Lymphadenopathy:    She has no cervical adenopathy.  Neurological: She is alert and oriented to person, place, and time. No cranial nerve deficit.  Skin: Skin is warm. No rash noted. Nails show no clubbing.  Psychiatric: She has a normal mood and affect.      Data Reviewed: Basic Metabolic Panel: Recent Labs  Lab 10/15/17 2230 10/16/17 0846 10/17/17 0453 10/18/17 0311 10/19/17 0719  NA 134* 137 137 137 137  K 4.1 4.2 4.4 4.5 4.5  CL 102 105 106 107 107  CO2 24 22 20* 21* 20*  GLUCOSE 241* 176* 190* 210* 219*  BUN 18 22* 25* 35* 41*  CREATININE 1.08* 1.18* 1.04* 1.37* 1.45*  CALCIUM 10.2 10.3 11.1* 11.0* 11.4*   Liver Function Tests: Recent Labs  Lab 10/15/17 2230  AST 33  ALT 20  ALKPHOS 85  BILITOT 0.2*  PROT 7.6  ALBUMIN 3.1*   Recent Labs  Lab 10/15/17 2230  LIPASE 26   CBC: Recent Labs  Lab 10/15/17 2230 10/16/17 0846  10/16/17 2135 10/17/17 0453 10/18/17 0311 10/19/17 0719 10/20/17 0321  WBC 11.5* 11.7*  --   --  11.3* 12.9* 14.9* 12.9*  NEUTROABS 7.9*  --   --   --   --   --   --   --   HGB 6.6* 8.8*   < > 9.0* 8.8* 8.1* 7.8* 7.8*  HCT 21.6* 27.6*   < > 27.6* 27.4* 25.3* 24.6* 25.0*  MCV  90.0 87.2  --   --  88.6 87.6 88.0 88.0  PLT 233 219  --   --  194 212 237 250   < > = values in this interval not displayed.   Cardiac Enzymes: Recent Labs  Lab 10/15/17 2230  TROPONINI <0.03    CBG: Recent Labs  Lab 10/19/17 1154 10/19/17 1726 10/19/17 2152 10/20/17 0746 10/20/17 1125  GLUCAP 226* 196* 180* 177* 184*      Studies: No results found.  Scheduled Meds: . feeding supplement (GLUCERNA SHAKE)  237 mL Oral BID BM  . HYDROmorphone  1 mg Oral Q6H  . insulin aspart  0-5 Units Subcutaneous QHS  . insulin aspart  0-9 Units Subcutaneous TID WC  . insulin glargine  13 Units Subcutaneous QHS  . levothyroxine  125 mcg Oral QAC breakfast  . pantoprazole  40 mg Intravenous Q12H  . protein supplement  shake  11 oz Oral Q24H  . simvastatin  20 mg Oral Daily  . venlafaxine XR  75 mg Oral Q breakfast   Continuous Infusions: . methocarbamol (ROBAXIN)  IV Stopped (10/16/17 8242)    Assessment/Plan:  1. Symptomatic anemia with epigastric abdominal pain and GI bleed.  Patient had endoscopy today showing ulcer that was oozing.  Received homeless break.  Hemoglobin stable at this time. 2. 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  Omental biopsy showed metastatic cancer.  Patient had a lot of questions about this and I told the patient to talk to oncologist regarding cancer-related questions.  Seen by oncology.  Patient needs outpatient PET scan.    Follow-up biopsy results. 3. DVT bilateral lower extremity unable to give anticoagulation with GI bleed.  Patient has IVC filter.  Pain control with oxycodone.,  Scheduled Dilaudid.  Dillaudid  Is changed from as needed to scheduled.  Continue oxycodone, out of bed to chair as much as possible. 4. Type 2 diabetes mellitus ; uncontrolled.  Adjusted the dose of Lantus.   5. Hypothyroidism unspecified on levothyroxine 6. Hyperlipidemia unspecified on simvastatin 7. Depression on Effexor deCondition: Physical therapy recommended home health physical therapy.  Likely discharge home tomorrow. Code Status:     Code Status Orders  (From admission, onward)        Start     Ordered   10/16/17 0027  Full code  Continuous     10/16/17 0026    Code Status History    Date Active Date Inactive Code Status Order ID Comments User Context   09/07/2017 16:09 09/09/2017 16:37 Full Code [id number]  Hillary Bow, MD ED     Family Communication: Permission to speak medically in front of everybody in the room Disposition Plan: To be determined  Consultants:  Oncology  Gastroenterology  Procedures:  Endoscopy  Time spent: 30 minutes  [facility]

## 2017-10-20 NOTE — Evaluation (Signed)
Physical Therapy Evaluation Patient Details Name: Ann Larson MRN: [id number] DOB: 1956/08/14 Today's Date: 10/20/2017   History of Present Illness  Ann Larson is a 62yo black female who comes to [facility] on2/25 with epigastric pain, SOB, fatigue, and near syncope. The pt found to have GIB, as well as extesnive mets in the peritoneum. Vascular consult reports pt is not a surgical candidate at this time, nor is she a candidate for anticoagulation; order for compression hose placed. PMH: DVT formerly on xarelto c subsequent GIB, s/p IVC filter.   Clinical Impression  Pt admitted with above diagnosis. Pt currently with functional limitations due to the deficits listed below (see "PT Problem List"). Upon entry, the patient is received supine in bed, no family/caregiver present. The pt is awake and agreeable to participate. Pt c/o 5/10 LLE pain which increases to 10/10 after transfer at bedside.  Pt requires max-total Assist for management of LLE in/out of bed d/t weakness and pain. Pt requires maximal effort to transfer STS with RW, and once standing lack sufficient BUE strength to utilize the RW for a 2-point hop to gait and LLE NWB (for pain control). Pt will benefit from skilled PT intervention to increase independence and safety with basic mobility in preparation for discharge to the venue listed below.    Patient suffers from weakness and pain which impairs their ability to perform daily activities like household distance AMB, access to kitchen and bathroom in the home.  A walker alone will not resolve the issues with performing activities of daily living. A wheelchair will allow patient to safely perform daily activities.  The patient can self propel in the home or has a caregiver who can provide assistance.         Follow Up Recommendations Home health PT;Supervision/Assistance - 24 hour;Supervision for mobility/OOB    Equipment Recommendations  Wheelchair (measurements PT);Wheelchair  cushion (measurements PT);3in1 (PT); Elevated leg rests; anti-tipper devices.    Recommendations for Other Services       Precautions / Restrictions Precautions Precautions: Fall Restrictions Weight Bearing Restrictions: No      Mobility  Bed Mobility Overal bed mobility: Needs Assistance Bed Mobility: Supine to Sit;Sit to Supine     Supine to sit: Mod assist Sit to supine: Mod assist   General bed mobility comments: Unable to move the LLE without max-total assitance   Transfers Overall transfer level: Needs assistance Equipment used: Rolling walker (2 wheeled) Transfers: Sit to/from Stand Sit to Stand: Min guard         General transfer comment: requires max effort, heavily limited by pain  Ambulation/Gait Ambulation/Gait assistance: (unable at this time. )              Stairs            Wheelchair Mobility    Modified Rankin (Stroke Patients Only)       Balance                                             Pertinent Vitals/Pain Pain Assessment: 0-10 Pain Score: 10-Worst pain ever Pain Location: Left Leg knee to hip; 5/10 upon entry with 10/10 after transfer.  Pain Intervention(s): Limited activity within patient's tolerance;Premedicated before session;Monitored during session    [place] expects to be discharged to:: Private residence Living Arrangements: Spouse/significant other Available Help at Discharge: Family(husband; sister not  a reliable helper) Type of Home: Mobile home(doublewide ) Home Access: Stairs to enter Entrance Stairs-Rails: Can reach both;Left;Right Entrance Stairs-Number of Steps: 3 Home Layout: One level Home Equipment: None      Prior Function Level of Independence: Independent               Hand Dominance        Extremity/Trunk Assessment   Upper Extremity Assessment Upper Extremity Assessment: Generalized weakness(unable to use a RW for unloading of LE during  gait. )    Lower Extremity Assessment Lower Extremity Assessment: Generalized weakness    Cervical / Trunk Assessment Cervical / Trunk Assessment: Normal  Communication   Communication: No difficulties  Cognition Arousal/Alertness: Awake/alert Behavior During Therapy: WFL for tasks assessed/performed Overall Cognitive Status: Within Functional Limits for tasks assessed                                        General Comments      Exercises     Assessment/Plan    PT Assessment Patient needs continued PT services  PT Problem List Decreased strength;Decreased activity tolerance;Decreased balance;Decreased mobility;Obesity;Pain       PT Treatment Interventions Stair training;DME instruction;Functional mobility training;Therapeutic activities;Therapeutic exercise;Wheelchair mobility training;Patient/family education    PT Goals (Current goals can be found in the Care Plan section)  Acute Rehab PT Goals Patient Stated Goal: regain independence for mobiilty PT Goal Formulation: With patient Time For Goal Achievement: 11/03/17 Potential to Achieve Goals: Fair    Frequency Min 2X/week   Barriers to discharge Inaccessible home environment;Decreased caregiver support stairs to enter, limited room for access to bathroom with WC     Co-evaluation               AM-PAC PT "6 Clicks" Daily Activity  Outcome Measure Difficulty turning over in bed (including adjusting bedclothes, sheets and blankets)?: Unable Difficulty moving from lying on back to sitting on the side of the bed? : Unable Difficulty sitting down on and standing up from a chair with arms (e.g., wheelchair, bedside commode, etc,.)?: Unable Help needed moving to and from a bed to chair (including a wheelchair)?: Total Help needed walking in hospital room?: Total Help needed climbing 3-5 steps with a railing? : Total 6 Click Score: 6    End of Session Equipment Utilized During Treatment: Gait  belt Activity Tolerance: Patient limited by pain;Patient limited by fatigue Patient left: in bed;with call bell/phone within reach   PT Visit Diagnosis: Pain;Difficulty in walking, not elsewhere classified (R26.2) Pain - Right/Left: Left Pain - part of body: Leg    Time: 2542-7062 PT Time Calculation (min) (ACUTE ONLY): 21 min   Charges:   PT Evaluation $PT Eval High Complexity: 1 High     PT G Codes:        11:44 AM, 10-26-2017 Etta Grandchild, PT, DPT Physical Therapist - [facility]  [phone] ([place])     Lelynd Poer C 10-26-17, 11:41 AM

## 2017-10-20 NOTE — Plan of Care (Signed)
  Progressing Bowel/Gastric: Will show no signs and symptoms of gastrointestinal bleeding 10/20/2017 2144 - Progressing by Loran Senters, RN Clinical Measurements: Diagnostic test results will improve 10/20/2017 2144 - Progressing by Loran Senters, RN Pain Managment: General experience of comfort will improve 10/20/2017 2144 - Progressing by Loran Senters, RN

## 2017-10-21 DIAGNOSIS — Z86718 Personal history of other venous thrombosis and embolism: Secondary | ICD-10-CM

## 2017-10-21 DIAGNOSIS — E46 Unspecified protein-calorie malnutrition: Secondary | ICD-10-CM

## 2017-10-21 DIAGNOSIS — K5900 Constipation, unspecified: Secondary | ICD-10-CM

## 2017-10-21 LAB — GLUCOSE, CAPILLARY
GLUCOSE-CAPILLARY: 204 mg/dL — AB (ref 65–99)
GLUCOSE-CAPILLARY: 200 mg/dL — AB (ref 65–99)
Glucose-Capillary: 195 mg/dL — ABNORMAL HIGH (ref 65–99)
Glucose-Capillary: 161 mg/dL — ABNORMAL HIGH (ref 65–99)
Glucose-Capillary: 140 mg/dL — ABNORMAL HIGH (ref 65–99)

## 2017-10-21 LAB — CBC
HCT: 26.7 % — ABNORMAL LOW (ref 35.0–47.0)
Hemoglobin: 8.3 g/dL — ABNORMAL LOW (ref 12.0–16.0)
MCH: 27.5 pg (ref 26.0–34.0)
MCHC: 31.2 g/dL — ABNORMAL LOW (ref 32.0–36.0)
MCV: 88.1 fL (ref 80.0–100.0)
PLATELETS: 355 10*3/uL (ref 150–440)
RBC: 3.03 MIL/uL — AB (ref 3.80–5.20)
RDW: 16.5 % — AB (ref 11.5–14.5)
WBC: 14.1 10*3/uL — AB (ref 3.6–11.0)

## 2017-10-21 MED ORDER — HYDROMORPHONE HCL 2 MG PO TABS
2.0000 mg | ORAL_TABLET | ORAL | Status: DC
Start: 2017-10-21 — End: 2017-10-21

## 2017-10-21 MED ORDER — SODIUM CHLORIDE 0.9 % IV SOLN
200.0000 mg | INTRAVENOUS | Status: DC
  Administered 2017-10-21 – 2017-10-22 (×2): 200 mg via INTRAVENOUS
  Filled 2017-10-21 (×3): qty 10

## 2017-10-21 MED ORDER — FLEET ENEMA 7-19 GM/118ML RE ENEM
1.0000 | ENEMA | Freq: Once | RECTAL | Status: AC
  Administered 2017-10-21: 1 via RECTAL

## 2017-10-21 MED ORDER — HYDROMORPHONE HCL 2 MG PO TABS
2.0000 mg | ORAL_TABLET | ORAL | Status: DC
  Administered 2017-10-21 – 2017-10-22 (×8): 2 mg via ORAL
  Filled 2017-10-21 (×8): qty 1

## 2017-10-21 MED ORDER — INSULIN GLARGINE 100 UNIT/ML ~~LOC~~ SOLN
15.0000 [IU] | Freq: Every day | SUBCUTANEOUS | Status: DC
  Administered 2017-10-21: 15 [IU] via SUBCUTANEOUS
  Filled 2017-10-21 (×2): qty 0.15

## 2017-10-21 NOTE — Progress Notes (Signed)
Ann Larson   DOB:05/10/1956   WC#:[id number]    Subjective: Patient resting in the bed without any acute distress.  Patient concerned about her situation.  Her appetite is poor.  She has abdominal distention/discomfort no abdominal pain.  Positive for constipation.  No diarrhea.  ROS: Denies any chest pain or shortness of breath cough.  Complains of swelling of the legs.  Objective:  Vitals:   10/21/17 0445 10/21/17 0810  BP:  111/60  Pulse: (!) 104 (!) 102  Resp:  16  Temp:  97.7 F (36.5 C)  SpO2:  100%     Intake/Output Summary (Last 24 hours) at 10/21/2017 1112 Last data filed at 10/21/2017 0959 Gross per 24 hour  Intake 0 ml  Output 200 ml  Net -200 ml    GENERAL Alert, no distress and comfortable.  EYES: no pallor or icterus OROPHARYNX: no thrush or ulceration. NECK: supple, no masses felt LYMPH:  no palpable lymphadenopathy in the cervical, axillary or inguinal regions LUNGS: decreased breath sounds to auscultation at bases and  No wheeze or crackles HEART/CVS: regular rate & rhythm and no murmurs; positive for bilateral lower extremity edema ABDOMEN: abdomen soft, tender  on deep palpation. and normal bowel sounds Musculoskeletal:no cyanosis of digits and no clubbing  PSYCH: alert & oriented x 3 with fluent speech NEURO: no focal motor/sensory deficits SKIN:  no rashes or significant lesions   Labs:  Lab Results  Component Value Date   WBC 14.1 (H) 10/21/2017   HGB 8.3 (L) 10/21/2017   HCT 26.7 (L) 10/21/2017   MCV 88.1 10/21/2017   PLT 355 10/21/2017   NEUTROABS 7.9 (H) 10/15/2017    Lab Results  Component Value Date   NA 137 10/19/2017   K 4.5 10/19/2017   CL 107 10/19/2017   CO2 20 (L) 10/19/2017    Studies:  No results found.  Assessment & Plan:   # Patient is a61 y.o.femalewith newly diagnosed metastatic pancreatic cancer-  # Metastatic disease pancreatic-,CA 19.9 significantly elevated at 124491, compatible with metastatic  pancreatic cancer,  involving lung, liver, omental caking.  Discussed the seriousness of the diagnosis; suboptimal response rates from chemotherapy.  However, patient situation is complicated by GI bleed/gastric ulcer; and extensive DVT [see discussion below]  # Symptomatic anemia- S/p EGD which revealed bleeding ulcer, treated with homospray.  Hemoglobin 8.3 no active bleeding at this time.  Recommend IV iron Venofer.   # Extensivebilaterallower extremity DVT; status post IVC filter.  Poor candidate for anticoagulation given bleeding gastric ulcers.   #I had a long discussion the patient regarding the difficult situation; I would recommend she inform family regarding her status.  And I also encouraged to have a family meeting with Dr. Tasia Catchings tomorrow to discuss the management/prognosis. Discussed with Dr.Konidena.   Cammie Sickle, MD 10/21/2017  11:12 AM

## 2017-10-21 NOTE — Progress Notes (Signed)
Patient complaining of abdominal pain related to constipation. This RN offered to give dose of lactulose, but patient refuses; is requesting enema. MD Pyreddy made aware. Verbal order given for one time dose of fleet enema. Will place order and administer as prescribed. This RN will continue to monitor.    Ann Larson M

## 2017-10-21 NOTE — Progress Notes (Signed)
Patient ID: Ann Larson, female   DOB: 06/13/1956, 62 y.o.   MRN: [id number]  [facility] PROGRESS NOTE  Ann Larson [id number] DOB: Jan 03, 1956 DOA: 10/15/2017 PCP: Lorelee Market, MD  HPI/Subjective: Patient still has leg pain.  Constipation, mild abdominal bloating.  Otherwise no complaints  Objective: Vitals:   10/21/17 0445 10/21/17 0810  BP:  111/60  Pulse: (!) 104 (!) 102  Resp:  16  Temp:  97.7 F (36.5 C)  SpO2:  100%    Filed Weights   10/15/17 2217 10/15/17 2218 10/16/17 0149  Weight: 102.5 kg (226 lb) 107.2 kg (236 lb 5.3 oz) 105.7 kg (233 lb 0.4 oz)    ROS: Review of Systems  Constitutional: Negative for chills and fever.  Eyes: Negative for blurred vision.  Respiratory: Negative for cough and shortness of breath.   Cardiovascular: Negative for chest pain.  Gastrointestinal: Negative for abdominal pain, constipation, diarrhea, nausea and vomiting.  Genitourinary: Negative for dysuria.  Musculoskeletal: Negative for joint pain.  Neurological: Negative for dizziness and headaches.   Exam: Physical Exam  Constitutional: She is oriented to person, place, and time.  HENT:  Nose: No mucosal edema.  Mouth/Throat: No oropharyngeal exudate or posterior oropharyngeal edema.  Eyes: Conjunctivae, EOM and lids are normal. Pupils are equal, round, and reactive to light.  Neck: No JVD present. Carotid bruit is not present. No edema present. No thyroid mass and no thyromegaly present.  Cardiovascular: S1 normal and S2 normal. Exam reveals no gallop.  No murmur heard. Pulses:      Dorsalis pedis pulses are 2+ on the right side, and 2+ on the left side.  Respiratory: No respiratory distress. She has no wheezes. She has no rhonchi. She has no rales.  GI: Soft. Bowel sounds are normal. She exhibits distension. There is no tenderness.  Musculoskeletal:       Right ankle: She exhibits swelling.       Left ankle: She exhibits swelling.   Lymphadenopathy:    She has no cervical adenopathy.  Neurological: She is alert and oriented to person, place, and time. No cranial nerve deficit.  Skin: Skin is warm. No rash noted. Nails show no clubbing.  Psychiatric: She has a normal mood and affect.      Data Reviewed: Basic Metabolic Panel: Recent Labs  Lab 10/15/17 2230 10/16/17 0846 10/17/17 0453 10/18/17 0311 10/19/17 0719  NA 134* 137 137 137 137  K 4.1 4.2 4.4 4.5 4.5  CL 102 105 106 107 107  CO2 24 22 20* 21* 20*  GLUCOSE 241* 176* 190* 210* 219*  BUN 18 22* 25* 35* 41*  CREATININE 1.08* 1.18* 1.04* 1.37* 1.45*  CALCIUM 10.2 10.3 11.1* 11.0* 11.4*   Liver Function Tests: Recent Labs  Lab 10/15/17 2230  AST 33  ALT 20  ALKPHOS 85  BILITOT 0.2*  PROT 7.6  ALBUMIN 3.1*   Recent Labs  Lab 10/15/17 2230  LIPASE 26   CBC: Recent Labs  Lab 10/15/17 2230  10/17/17 0453 10/18/17 0311 10/19/17 0719 10/20/17 0321 10/21/17 0433  WBC 11.5*   < > 11.3* 12.9* 14.9* 12.9* 14.1*  NEUTROABS 7.9*  --   --   --   --   --   --   HGB 6.6*   < > 8.8* 8.1* 7.8* 7.8* 8.3*  HCT 21.6*   < > 27.4* 25.3* 24.6* 25.0* 26.7*  MCV 90.0   < > 88.6 87.6 88.0 88.0 88.1  PLT 233   < >  194 212 237 250 355   < > = values in this interval not displayed.   Cardiac Enzymes: Recent Labs  Lab 10/15/17 2230  TROPONINI <0.03    CBG: Recent Labs  Lab 10/20/17 0746 10/20/17 1125 10/20/17 2019 10/21/17 0756 10/21/17 0910  GLUCAP 177* 184* 159* 204* 200*      Studies: No results found.  Scheduled Meds: . feeding supplement (GLUCERNA SHAKE)  237 mL Oral BID BM  . HYDROmorphone  2 mg Oral Q4H  . insulin aspart  0-5 Units Subcutaneous QHS  . insulin aspart  0-9 Units Subcutaneous TID WC  . insulin glargine  13 Units Subcutaneous QHS  . levothyroxine  125 mcg Oral QAC breakfast  . pantoprazole  40 mg Intravenous Q12H  . protein supplement shake  11 oz Oral Q24H  . simvastatin  20 mg Oral Daily  . venlafaxine XR   75 mg Oral Q breakfast   Continuous Infusions: . methocarbamol (ROBAXIN)  IV Stopped (10/16/17 2446)    Assessment/Plan:  1. Symptomatic anemia with epigastric abdominal pain and GI bleed.  Patient had endoscopy  showing ulcer that was oozing.  Received homeless break.  Hemoglobin stable at this time. 2. 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  Omental biopsy showed metastatic cancer.  Patient had a lot of questions about this and I told the patient to talk to oncologist regarding cancer-related questions.  Seen by oncology.  Patient needs outpatient PET scan.   Talk with Dr. Rogue Bussing to talk to patient and I appreciate his consult. 3. DVT bilateral lower extremity unable to give anticoagulation with GI bleed.  Patient has IVC filter.  Pain control with oxycodone.,  Scheduled Dilaudid.  Dillaudid  Is changed from as needed to scheduled.  Continue oxycodone, out of bed to chair as much as possible.  Increased the dose of Dilaudid. 4. Type 2 diabetes mellitus ; uncontrolled.  Adjusted the dose of Lantus.   5. Hypothyroidism unspecified on levothyroxine 6. Hyperlipidemia unspecified on simvastatin 7. Depression on Effexor deCondition: Physical therapy recommended home health physical therapy ,still has a lot of leg pain, titrating pain medicines so unable to discharge the patient yet. Code Status:     Code Status Orders  (From admission, onward)        Start     Ordered   10/16/17 0027  Full code  Continuous     10/16/17 0026    Code Status History    Date Active Date Inactive Code Status Order ID Comments User Context   09/07/2017 16:09 09/09/2017 16:37 Full Code [id number]  Hillary Bow, MD ED     Family Communication: Permission to speak medically in front of everybody in the room Disposition Plan: To be determined  Consultants:  Oncology  Gastroenterology  Procedures:  Endoscopy  Time spent: 30 minutes  [facility]

## 2017-10-22 ENCOUNTER — Other Ambulatory Visit: Payer: Self-pay | Admitting: Oncology

## 2017-10-22 ENCOUNTER — Other Ambulatory Visit: Payer: Self-pay | Admitting: *Deleted

## 2017-10-22 ENCOUNTER — Encounter: Payer: Self-pay | Admitting: Oncology

## 2017-10-22 DIAGNOSIS — K8689 Other specified diseases of pancreas: Secondary | ICD-10-CM

## 2017-10-22 DIAGNOSIS — D500 Iron deficiency anemia secondary to blood loss (chronic): Secondary | ICD-10-CM

## 2017-10-22 DIAGNOSIS — C259 Malignant neoplasm of pancreas, unspecified: Secondary | ICD-10-CM | POA: Insufficient documentation

## 2017-10-22 HISTORY — DX: Hypercalcemia: E83.52

## 2017-10-22 HISTORY — DX: Malignant neoplasm of pancreas, unspecified: C25.9

## 2017-10-22 LAB — GLUCOSE, CAPILLARY
GLUCOSE-CAPILLARY: 154 mg/dL — AB (ref 65–99)
Glucose-Capillary: 164 mg/dL — ABNORMAL HIGH (ref 65–99)
Glucose-Capillary: 178 mg/dL — ABNORMAL HIGH (ref 65–99)

## 2017-10-22 LAB — SURGICAL PATHOLOGY

## 2017-10-22 MED ORDER — PROCHLORPERAZINE MALEATE 10 MG PO TABS: 10.0000 mg | ORAL_TABLET | Freq: Four times a day (QID) | ORAL | 1 refills | Status: DC | PRN

## 2017-10-22 MED ORDER — HYDROMORPHONE HCL 2 MG PO TABS: 2.0000 mg | ORAL_TABLET | ORAL | 0 refills | Status: DC

## 2017-10-22 MED ORDER — TOUJEO SOLOSTAR 300 UNIT/ML ~~LOC~~ SOPN: 15.0000 [IU] | PEN_INJECTOR | Freq: Every day | SUBCUTANEOUS | 4 refills | Status: DC

## 2017-10-22 MED ORDER — ONDANSETRON HCL 8 MG PO TABS: 8.0000 mg | ORAL_TABLET | Freq: Two times a day (BID) | ORAL | 1 refills | Status: AC | PRN

## 2017-10-22 MED ORDER — PREMIER PROTEIN SHAKE: 11.0000 [oz_av] | ORAL | 0 refills | Status: AC

## 2017-10-22 MED ORDER — OXYCODONE HCL 5 MG PO TABS: 5.0000 mg | ORAL_TABLET | ORAL | 0 refills | Status: DC | PRN

## 2017-10-22 NOTE — Progress Notes (Signed)
Hematology/Oncology Progress Note [facility] [phone] [phone]  Patient Care Team: Lorelee Market, MD as PCP - General (Family Medicine)   Name of the patient: Ann Larson  [id number]  1955/09/28  Date of visit: 10/22/17   INTERVAL HISTORY-  Patient was seen and evaluated at bedside. She feels that lower extremity pain is better controlled with pain medication. Family members at bedside, daughter, son in law and husband.   Review of systems- Review of Systems  Constitutional: Positive for malaise/fatigue. Negative for chills and fever.  HENT: Negative for nosebleeds.   Eyes: Negative for photophobia.  Respiratory: Negative for cough and hemoptysis.   Cardiovascular: Negative for chest pain.  Gastrointestinal: Negative for abdominal pain. Negative for nausea and vomiting.  Genitourinary: Negative for dysuria.  Musculoskeletal:       Bilateral lower extremities pain.   Skin: Negative for rash.  Neurological: Positive for weakness. Negative for dizziness.  Psychiatric/Behavioral: The patient is nervous/anxious.     Allergies  Allergen Reactions  . Sulfa Antibiotics     Patient Active Problem List   Diagnosis Date Noted  . Multiple lesions of metastatic malignancy ([place]) 10/16/2017  . Generalized abdominal pain   . Omental mass   . Pancreatic mass   . Hypothyroidism 10/15/2017  . Diabetes ([place]) 10/15/2017  . Hypertension 10/05/2017  . Deep vein thrombosis (DVT) of right lower extremity ([place]) 09/26/2017  . Personal history of peptic ulcer disease   . Gastritis with hemorrhage   . Symptomatic anemia   . Heme + stool   . Acute GI bleeding   . GI bleed 09/07/2017     Past Medical History:  Diagnosis Date  . Depression   . Diabetes mellitus without complication ([place])   . Diverticulitis   . DVT (deep vein thrombosis) in pregnancy ([place]) 08/29/2017   Right popliteal and femoral  . GERD (gastroesophageal reflux  disease)   . Hyperlipidemia   . Hypertension   . Hypothyroidism   . Obesity      Past Surgical History:  Procedure Laterality Date  . ABDOMINAL HYSTERECTOMY  2005  . CHOLECYSTECTOMY  2015  . COLONOSCOPY WITH PROPOFOL N/A 07/01/2015   Procedure: COLONOSCOPY WITH PROPOFOL;  Surgeon: Josefine Class, MD;  Location: [facility] ENDOSCOPY;  Service: Endoscopy;  Laterality: N/A;  . ESOPHAGOGASTRODUODENOSCOPY (EGD) WITH PROPOFOL N/A 07/01/2015   Procedure: ESOPHAGOGASTRODUODENOSCOPY (EGD) WITH PROPOFOL;  Surgeon: Josefine Class, MD;  Location: [facility] ENDOSCOPY;  Service: Endoscopy;  Laterality: N/A;  . ESOPHAGOGASTRODUODENOSCOPY (EGD) WITH PROPOFOL N/A 09/08/2017   Procedure: ESOPHAGOGASTRODUODENOSCOPY (EGD) WITH PROPOFOL;  Surgeon: Lucilla Lame, MD;  Location: [facility] ENDOSCOPY;  Service: Endoscopy;  Laterality: N/A;  . ESOPHAGOGASTRODUODENOSCOPY (EGD) WITH PROPOFOL N/A 09/21/2017   Procedure: ESOPHAGOGASTRODUODENOSCOPY (EGD) WITH PROPOFOL;  Surgeon: Lucilla Lame, MD;  Location: [facility] ENDOSCOPY;  Service: Endoscopy;  Laterality: N/A;  . ESOPHAGOGASTRODUODENOSCOPY (EGD) WITH PROPOFOL N/A 10/18/2017   Procedure: ESOPHAGOGASTRODUODENOSCOPY (EGD) WITH PROPOFOL;  Surgeon: [name], MD;  Location: [facility] ENDOSCOPY;  Service: Gastroenterology;  Laterality: N/A;  . IVC FILTER INSERTION N/A 09/08/2017   Procedure: IVC FILTER INSERTION;  Surgeon: Algernon Huxley, MD;  Location: [facility] LAB;  Service: Cardiovascular;  Laterality: N/A;    Social History   Socioeconomic History  . Marital status: Married    Spouse name: Not on file  . Number of children: Not on file  . Years of education: Not on file  . Highest education level: Not on file  Social Needs  . Financial  resource strain: Not on file  . Food insecurity - worry: Not on file  . Food insecurity - inability: Not on file  . Transportation needs - medical: Not on file  . Transportation needs - non-medical: Not on file  Occupational History    . Not on file  Tobacco Use  . Smoking status: Never Smoker  . Smokeless tobacco: Never Used  Substance and Sexual Activity  . Alcohol use: No  . Drug use: No  . Sexual activity: Not on file  Other Topics Concern  . Not on file  Social History Narrative  . Not on file     Family History  Problem Relation Age of Onset  . Deep vein thrombosis Neg Hx      Current Facility-Administered Medications:  .  acetaminophen (TYLENOL) tablet 650 mg, 650 mg, Oral, Q6H PRN **OR** acetaminophen (TYLENOL) suppository 650 mg, 650 mg, Rectal, Q6H PRN, Lance Coon, MD .  bisacodyl (DULCOLAX) suppository 10 mg, 10 mg, Rectal, Daily PRN, Epifanio Lesches, MD, 10 mg at 10/20/17 1539 .  feeding supplement (GLUCERNA SHAKE) (GLUCERNA SHAKE) liquid 237 mL, 237 mL, Oral, BID BM, Gouru, Aruna, MD, 237 mL at 10/22/17 1057 .  HYDROmorphone (DILAUDID) tablet 2 mg, 2 mg, Oral, Q4H, Epifanio Lesches, MD, 2 mg at 10/22/17 1450 .  insulin aspart (novoLOG) injection 0-5 Units, 0-5 Units, Subcutaneous, QHS, Wieting, Richard, MD .  insulin aspart (novoLOG) injection 0-9 Units, 0-9 Units, Subcutaneous, TID WC, Loletha Grayer, MD, 2 Units at 10/22/17 1209 .  insulin glargine (LANTUS) injection 15 Units, 15 Units, Subcutaneous, QHS, Epifanio Lesches, MD, 15 Units at 10/21/17 2120 .  iron sucrose (VENOFER) 200 mg in sodium chloride 0.9 % 150 mL IVPB, 200 mg, Intravenous, Q24H, Cammie Sickle, MD, Stopped at 10/22/17 1342 .  lactulose (CHRONULAC) 10 GM/15ML solution 30 g, 30 g, Oral, Q6H PRN, Idelle Crouch, MD, 30 g at 10/20/17 1827 .  levothyroxine (SYNTHROID, LEVOTHROID) tablet 125 mcg, 125 mcg, Oral, QAC breakfast, Lance Coon, MD, 125 mcg at 10/22/17 0515 .  magnesium hydroxide (MILK OF MAGNESIA) suspension 30 mL, 30 mL, Oral, Daily PRN, Epifanio Lesches, MD, 30 mL at 10/20/17 0831 .  methocarbamol (ROBAXIN) 500 mg in dextrose 5 % 50 mL IVPB, 500 mg, Intravenous, Q6H PRN, Lance Coon, MD,  Stopped at 10/16/17 [phone] .  morphine 2 MG/ML injection 2 mg, 2 mg, Intravenous, Q4H PRN, Lance Coon, MD, 2 mg at 10/21/17 0434 .  ondansetron (ZOFRAN) tablet 4 mg, 4 mg, Oral, Q6H PRN **OR** ondansetron (ZOFRAN) injection 4 mg, 4 mg, Intravenous, Q6H PRN, Lance Coon, MD, 4 mg at 10/17/17 8527 .  oxyCODONE (Oxy IR/ROXICODONE) immediate release tablet 5 mg, 5 mg, Oral, Q4H PRN, Epifanio Lesches, MD, 5 mg at 10/20/17 2305 .  pantoprazole (PROTONIX) injection 40 mg, 40 mg, Intravenous, Q12H, Lance Coon, MD, 40 mg at 10/22/17 1057 .  protein supplement (PREMIER PROTEIN) liquid, 11 oz, Oral, Q24H, Gouru, Aruna, MD, 11 oz at 10/21/17 2120 .  simvastatin (ZOCOR) tablet 20 mg, 20 mg, Oral, Daily, Lance Coon, MD, 20 mg at 10/21/17 1720 .  traZODone (DESYREL) tablet 25 mg, 25 mg, Oral, QHS PRN, Lance Coon, MD, 25 mg at 10/17/17 0026 .  venlafaxine XR (EFFEXOR-XR) 24 hr capsule 75 mg, 75 mg, Oral, Q breakfast, Lance Coon, MD, 75 mg at 10/22/17 0854   Physical exam:  Vitals:   10/21/17 1707 10/21/17 1958 10/22/17 0432 10/22/17 0800  BP: (!) 109/51 117/60 (!) 101/56 (!) 97/52  Pulse: 98 90 97   Resp: 18 16 16 16   Temp:  98.3 F (36.8 C) 98.4 F (36.9 C) 98 F (36.7 C)  TempSrc:  Oral Oral Oral  SpO2: 99% 91% 95% 92%  Weight:      Height:       GENERAL:Alert, no distress and comfortable.  EYES: +  Pallor, non icterus OROPHARYNX: no thrush or ulceration; good dentition  NECK: supple, no masses felt LUNGS: clear to auscultation and  No wheeze or crackles HEART/CVS: regular rate & rhythm and no murmurs; No lower extremity edema ABDOMEN: abdomen soft, has normal bowel sounds. Diffuse discomfort with palpation.  Musculoskeletal:no cyanosis of digits and no clubbing. Lower extremities tenderness.  PSYCH: alert & oriented x 3  NEURO: no focal motor/sensory deficits SKIN:  no rashes or significant lesions      CMP Latest Ref Rng & Units 10/19/2017  Glucose 65 - 99 mg/dL 219(H)   BUN 6 - 20 mg/dL 41(H)  Creatinine 0.44 - 1.00 mg/dL 1.45(H)  Sodium 135 - 145 mmol/L 137  Potassium 3.5 - 5.1 mmol/L 4.5  Chloride 101 - 111 mmol/L 107  CO2 22 - 32 mmol/L 20(L)  Calcium 8.9 - 10.3 mg/dL 11.4(H)  Total Protein 6.5 - 8.1 g/dL -  Total Bilirubin 0.3 - 1.2 mg/dL -  Alkaline Phos 38 - 126 U/L -  AST 15 - 41 U/L -  ALT 14 - 54 U/L -   CBC Latest Ref Rng & Units 10/21/2017  WBC 3.6 - 11.0 K/uL 14.1(H)  Hemoglobin 12.0 - 16.0 g/dL 8.3(L)  Hematocrit 35.0 - 47.0 % 26.7(L)  Platelets 150 - 440 K/uL 355      Assessment and plan- Patient is a 62 y.o. female with history of hemorraghic gastritis, extensive bilateral lower extremity DVT, presents with symptomatic anemia, hemoglobin 6.6, abdominal pain with abnormal CT findings.   # Metastatic pancreatic cancer,  involving lung, liver, omental caking. Pathology results were discussed with patient and family members. I  Had a lengthy discussion with patient and family about disease extent, poor prognosis. They are aware that patient has metastatic disease which is not curable. Goal of care is palliative treatment to decrease Systemic chemotherapy is recommended. I would hold off getting a medi port given her hypercoagulable state (see below). Gemcitabine and Abraxane can be potentially given via peripheral IV line. She can follow up with me on 10/22/2017 for chemotherapy discussion, chemotherapy class and possible starting treatment on 10/25/2017.   # symptomatic anemia, continue monitor, S/p EGD which revealed bleeding ulcer, treated with homospray. Hemoglobin is stable.  # Iron deficiency anemia secondary to blood loss: Agree with Venofer and will give her additional dose of Venofer outpatient.   # Extensive bilateral  lower extremity DVT: anticoagulation is contraindicated at this point due to ongoing blood loss. DVT maybe related to her metastatic tumor burden.  vascular surgeon recommendation noted. No intervention at this point.    # Hypercalcemia: likely due to metastatic pancreatic cancer, Calcium at 11.4. Plan outpatient Xgeva.   Above discussed with Dr.Konidena.   Earlie Server, MD, PhD Hematology Oncology [facility] Pager- [phone] 10/22/2017

## 2017-10-22 NOTE — Progress Notes (Signed)
START ON PATHWAY REGIMEN - Pancreatic     A cycle is every 28 days:     Nab-paclitaxel (protein bound)      Gemcitabine   **Always confirm dose/schedule in your pharmacy ordering system**    Patient Characteristics: Adenocarcinoma, Metastatic Disease, First Line, PS ? 2 Histology: Adenocarcinoma Current evidence of distant metastases<= Yes AJCC T Category: TX AJCC N Category: NX AJCC M Category: M1 AJCC 8 Stage Grouping: IV Line of Therapy: First Line Would you be surprised if this patient died  in the next year<= I would NOT be surprised if this patient died in the next year Intent of Therapy: Non-Curative / Palliative Intent, Discussed with Patient

## 2017-10-22 NOTE — Care Management Note (Signed)
Case Management Note  Patient Details  Name: Ann Larson MRN: [id number] Date of Birth: 07-18-1956  Subjective/Objective:  Patient with newly diagnosed metastatic pancreatic ca.She lives at home with her spouse. She is limited in her ambulation at this time. Extensive bilateral lower extremity DVT. PT recommending home health PT. Offered choice of home health agencies. Referral to [facility] with [facility] for HHPT. Ordered a wheelchair per PT recommendations from [facility]. PCP is Dr. Brunetta Genera.                    Action/Plan: [facility] for HHPT and wheelchair.   Expected Discharge Date:                  Expected Discharge Plan:  [place]  In-House Referral:     Discharge planning Services  CM Consult  Post Acute Care Choice:  Durable Medical Equipment, Home Health Choice offered to:  Patient  DME Arranged:  Wheelchair manual DME Agency:  [place]:  PT [place]:  [place]  Status of Service:  In process, will continue to follow  If discussed at Long Length of Stay Meetings, dates discussed:    Additional Comments:  Jolly Mango, RN 10/22/2017, 11:14 AM

## 2017-10-22 NOTE — Plan of Care (Signed)
  Education: Ability to identify signs and symptoms of gastrointestinal bleeding will improve 10/22/2017 0433 - Progressing by Greyson Riccardi, Lucille Passy, RN   Bowel/Gastric: Will show no signs and symptoms of gastrointestinal bleeding 10/22/2017 0433 - Progressing by Traye Bates, Lucille Passy, RN   Fluid Volume: Will show no signs and symptoms of excessive bleeding 10/22/2017 0433 - Progressing by Miller Limehouse, Lucille Passy, RN   Clinical Measurements: Complications related to the disease process, condition or treatment will be avoided or minimized 10/22/2017 0433 - Progressing by Uchechi Denison, Lucille Passy, RN   Education: Knowledge of General Education information will improve 10/22/2017 [phone] - Progressing by Kayon Dozier, Lucille Passy, RN   Education: Knowledge of General Education information will improve 10/22/2017 [phone] - Progressing by Tacha Manni, Lucille Passy, RN   Health Behavior/Discharge Planning: Ability to manage health-related needs will improve 10/22/2017 0433 - Progressing by Shawnia Vizcarrondo, Lucille Passy, RN   Clinical Measurements: Ability to maintain clinical measurements within normal limits will improve 10/22/2017 0433 - Progressing by Ignazio Kincaid, Lucille Passy, RN Will remain free from infection 10/22/2017 0433 - Progressing by Analy Bassford, Lucille Passy, RN Diagnostic test results will improve 10/22/2017 [phone] - Progressing by Bryna Colander, RN Respiratory complications will improve 10/22/2017 0433 - Progressing by Bryna Colander, RN Cardiovascular complication will be avoided 10/22/2017 0433 - Progressing by Timothea Bodenheimer, Lucille Passy, RN

## 2017-10-22 NOTE — Progress Notes (Signed)
Patient ID: Ann Larson, female   DOB: 13-Aug-1956, 62 y.o.   MRN: [id number]  [facility] PROGRESS NOTE  Ann Larson YIF:[id number] DOB: [birth year] DOA: 10/15/2017 PCP: Lorelee Market, MD  HPI/Subjective: Pain well controlled.  Spoke with Dr.YU over the phone, will discuss with family regarding treatment options for her pancreatic cancer. Objective: Vitals:   10/22/17 0432 10/22/17 0800  BP: (!) 101/56 (!) 97/52  Pulse: 97   Resp: 16 16  Temp: 98.4 F (36.9 C) 98 F (36.7 C)  SpO2: 95% 92%    Filed Weights   10/15/17 2217 10/15/17 2218 10/16/17 0149  Weight: 102.5 kg (226 lb) 107.2 kg (236 lb 5.3 oz) 105.7 kg (233 lb 0.4 oz)    ROS: Review of Systems  Constitutional: Negative for chills and fever.  Eyes: Negative for blurred vision.  Respiratory: Negative for cough and shortness of breath.   Cardiovascular: Negative for chest pain.  Gastrointestinal: Negative for abdominal pain, constipation, diarrhea, nausea and vomiting.  Genitourinary: Negative for dysuria.  Musculoskeletal: Negative for joint pain.  Neurological: Negative for dizziness and headaches.   Exam: Physical Exam  Constitutional: She is oriented to person, place, and time.  HENT:  Nose: No mucosal edema.  Mouth/Throat: No oropharyngeal exudate or posterior oropharyngeal edema.  Eyes: Conjunctivae, EOM and lids are normal. Pupils are equal, round, and reactive to light.  Neck: No JVD present. Carotid bruit is not present. No edema present. No thyroid mass and no thyromegaly present.  Cardiovascular: S1 normal and S2 normal. Exam reveals no gallop.  No murmur heard. Pulses:      Dorsalis pedis pulses are 2+ on the right side, and 2+ on the left side.  Respiratory: No respiratory distress. She has no wheezes. She has no rhonchi. She has no rales.  GI: Soft. Bowel sounds are normal. She exhibits distension. There is no tenderness.  Musculoskeletal:       Right ankle: She exhibits  swelling.       Left ankle: She exhibits swelling.  Lymphadenopathy:    She has no cervical adenopathy.  Neurological: She is alert and oriented to person, place, and time. No cranial nerve deficit.  Skin: Skin is warm. No rash noted. Nails show no clubbing.  Psychiatric: She has a normal mood and affect.      Data Reviewed: Basic Metabolic Panel: Recent Labs  Lab 10/15/17 2230 10/16/17 0846 10/17/17 0453 10/18/17 0311 10/19/17 0719  NA 134* 137 137 137 137  K 4.1 4.2 4.4 4.5 4.5  CL 102 105 106 107 107  CO2 24 22 20* 21* 20*  GLUCOSE 241* 176* 190* 210* 219*  BUN 18 22* 25* 35* 41*  CREATININE 1.08* 1.18* 1.04* 1.37* 1.45*  CALCIUM 10.2 10.3 11.1* 11.0* 11.4*   Liver Function Tests: Recent Labs  Lab 10/15/17 2230  AST 33  ALT 20  ALKPHOS 85  BILITOT 0.2*  PROT 7.6  ALBUMIN 3.1*   Recent Labs  Lab 10/15/17 2230  LIPASE 26   CBC: Recent Labs  Lab 10/15/17 2230  10/17/17 0453 10/18/17 0311 10/19/17 0719 10/20/17 0321 10/21/17 0433  WBC 11.5*   < > 11.3* 12.9* 14.9* 12.9* 14.1*  NEUTROABS 7.9*  --   --   --   --   --   --   HGB 6.6*   < > 8.8* 8.1* 7.8* 7.8* 8.3*  HCT 21.6*   < > 27.4* 25.3* 24.6* 25.0* 26.7*  MCV 90.0   < > 88.6  87.6 88.0 88.0 88.1  PLT 233   < > 194 212 237 250 355   < > = values in this interval not displayed.   Cardiac Enzymes: Recent Labs  Lab 10/15/17 2230  TROPONINI <0.03    CBG: Recent Labs  Lab 10/21/17 1153 10/21/17 1706 10/21/17 2105 10/22/17 0816 10/22/17 1152  GLUCAP 195* 161* 140* 178* 154*      Studies: No results found.  Scheduled Meds: . feeding supplement (GLUCERNA SHAKE)  237 mL Oral BID BM  . HYDROmorphone  2 mg Oral Q4H  . insulin aspart  0-5 Units Subcutaneous QHS  . insulin aspart  0-9 Units Subcutaneous TID WC  . insulin glargine  15 Units Subcutaneous QHS  . levothyroxine  125 mcg Oral QAC breakfast  . pantoprazole  40 mg Intravenous Q12H  . protein supplement shake  11 oz Oral Q24H  .  simvastatin  20 mg Oral Daily  . venlafaxine XR  75 mg Oral Q breakfast   Continuous Infusions: . iron sucrose 200 mg (10/22/17 1210)  . methocarbamol (ROBAXIN)  IV Stopped (10/16/17 2409)    Assessment/Plan:  1. Symptomatic anemia with epigastric abdominal pain and GI bleed.  Patient had endoscopy  showing ulcer that was oozing.  Received  homospray.  Hemoglobin stable at this time. 2. 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  Omental biopsy showed metastatic cancer.  Spoke with Dr. Rogue Bussing and also Dr. Jeani Sow today, they will let me know if patient needs further  workup for her cancer  in the hospital.  If not patient will be discharged home with home health. 3. DVT bilateral lower extremity unable to give anticoagulation with GI bleed.  Patient has IVC filter.  Pain control with oxycodone.,  Scheduled Dilaudid.  Dillaudid  Is changed from as needed to scheduled.  Continue oxycodone, out of bed to chair as much as possible.  Increased the dose of Dilaudid. 4. Type 2 diabetes mellitus ; uncontrolled.  Adjusted the dose of Lantus.   5. Hypothyroidism unspecified on levothyroxine 6. Hyperlipidemia unspecified on simvastatin 7. Depression on Effexor deCondition: Physical therapy recommended home health physical therapy ,. Code Status:     Code Status Orders  (From admission, onward)        Start     Ordered   10/16/17 0027  Full code  Continuous     10/16/17 0026    Code Status History    Date Active Date Inactive Code Status Order ID Comments User Context   09/07/2017 16:09 09/09/2017 16:37 Full Code [id number]  Hillary Bow, MD ED     Family Communication: Permission to speak medically in front of everybody in the room Disposition Plan: To be determined  Consultants:  Oncology  Gastroenterology  Procedures:  Endoscopy  Time spent: 30 minutes  [facility]

## 2017-10-22 NOTE — Discharge Summary (Signed)
Ann Larson, is a 62 y.o. female  DOB 1955-11-08  MRN [id number].  Admission date:  10/15/2017  Admitting Physician  Lance Coon, MD  Discharge Date:  10/22/2017   Primary MD  Lorelee Market, MD  Recommendations for primary care physician for things to follow:  Follow up with PCP in 1 week   Admission Diagnosis  Generalized abdominal pain [R10.84] Acute GI bleeding [K92.2] Near syncope [R55] Symptomatic anemia [D64.9]   Discharge Diagnosis  Generalized abdominal pain [R10.84] Acute GI bleeding [K92.2] Near syncope [R55] Symptomatic anemia [D64.9]    Principal Problem:   GI bleed Active Problems:   Symptomatic anemia   Acute GI bleeding   Deep vein thrombosis (DVT) of right lower extremity (HCC)   Hypothyroidism   Diabetes ([place])   Multiple lesions of metastatic malignancy (HCC)   Generalized abdominal pain   Omental mass   Pancreatic mass      Past Medical History:  Diagnosis Date  . Depression   . Diabetes mellitus without complication ([place])   . Diverticulitis   . DVT (deep vein thrombosis) in pregnancy ([place]) 08/29/2017   Right popliteal and femoral  . GERD (gastroesophageal reflux disease)   . Hyperlipidemia   . Hypertension   . Hypothyroidism   . Obesity     Past Surgical History:  Procedure Laterality Date  . ABDOMINAL HYSTERECTOMY  2005  . CHOLECYSTECTOMY  2015  . COLONOSCOPY WITH PROPOFOL N/A 07/01/2015   Procedure: COLONOSCOPY WITH PROPOFOL;  Surgeon: Josefine Class, MD;  Location: [facility] ENDOSCOPY;  Service: Endoscopy;  Laterality: N/A;  . ESOPHAGOGASTRODUODENOSCOPY (EGD) WITH PROPOFOL N/A 07/01/2015   Procedure: ESOPHAGOGASTRODUODENOSCOPY (EGD) WITH PROPOFOL;  Surgeon: Josefine Class, MD;  Location: [facility] ENDOSCOPY;  Service: Endoscopy;  Laterality: N/A;  .  ESOPHAGOGASTRODUODENOSCOPY (EGD) WITH PROPOFOL N/A 09/08/2017   Procedure: ESOPHAGOGASTRODUODENOSCOPY (EGD) WITH PROPOFOL;  Surgeon: Lucilla Lame, MD;  Location: [facility] ENDOSCOPY;  Service: Endoscopy;  Laterality: N/A;  . ESOPHAGOGASTRODUODENOSCOPY (EGD) WITH PROPOFOL N/A 09/21/2017   Procedure: ESOPHAGOGASTRODUODENOSCOPY (EGD) WITH PROPOFOL;  Surgeon: Lucilla Lame, MD;  Location: [facility] ENDOSCOPY;  Service: Endoscopy;  Laterality: N/A;  . ESOPHAGOGASTRODUODENOSCOPY (EGD) WITH PROPOFOL N/A 10/18/2017   Procedure: ESOPHAGOGASTRODUODENOSCOPY (EGD) WITH PROPOFOL;  Surgeon: [name], MD;  Location: [facility] ENDOSCOPY;  Service: Gastroenterology;  Laterality: N/A;  . IVC FILTER INSERTION N/A 09/08/2017   Procedure: IVC FILTER INSERTION;  Surgeon: Algernon Huxley, MD;  Location: [place] CV LAB;  Service: Cardiovascular;  Laterality: N/A;       History of present illness and  Hospital Course:     Kindly see H&P for history of present illness and admission details, please review complete Labs, Consult reports and Test reports for all details in brief  HPI  from the history and physical done on the day of admission 62 year old female patient diabetes mellitus type 2, recent right leg DVT came in because of abdominal pain, near syncope.   Hospital Course  : Near syncope secondary to severe anemia with GI bleeding.  Patient found to have hemoglobin 6.6 when she came.  Received 2 units of packed RBC transfusion. 2.  Symptomatic anemia secondary to GI bleed.  Received 2 units of packed RBC transfusion, seen by gastroenterology.  Globin improved to 8.8 after blood transfusion.  Seen by gastroenterology 3.  GI bleeding.  Patient seen by gastroenterology patient had EGD on February 28.  EGD showed oozing gastric ulcer, hemostasis achieved by application of hemospray powder.  That patient hemoglobin stayed stable.. And  8.3. 4.  Bilateral leg DVT but more larger on the right leg., patient had IVC filter placed,  unable to get anticoagulation because of GI bleeding.  Patient has severe pain in the right leg due to DVT, pain controlled with oxycodone, Dilaudid.  Prescriptions are given.  Has hypercoagulability secondary to pancreatic cancer.  \ 5.  Metastatic malignancy.  Patient had CT abdomen and pelvis with contrast on admission because of abdominal pain.  And a CT abdomen showed multiple metastatic lesions.  Reportedly patient had CT abdomen, pelvis 8 months ago which showed suspicious for pancreatic cancer but patient had MRI several months ago at [facility] and that time indicated that pancreatic lesion was likely inflammatory in nature.  But this time patient is seen by oncology, interventional radiology.  Patient had omental biopsy done by interventional radiology on 27 February.  Omental biopsy came positive for metastatic malignancy.  Patient had tumor marker showing elevated CA125 up to 241.  CT abdomen showed metastatic disease in the omentum, spleen, pulmonary, small ascites.  Seen by oncology, Dr. Tasia Catchings recommended that she follow-up with her on Thursday regarding chemotherapy options.  Patient, family had meeting with Dr.Yu oncology this morning. 6 diabetes mellitus type 2 can start on Toujeo 24 units.  Patient received Lantus during hospital stay.                                                                                        7.  Constipation due to narcotics: Use stool softeners as needed.  #8 depression: Continue Effexor.  Discharge Condition:  stable   Follow UP  Follow-up Information    Lorelee Market, MD Follow up in 1 week(s).   Specialty:  Family Medicine Contact information: [place] [address] [phone]        Earlie Server, MD. Schedule an appointment as soon as possible for a visit in 2 day(s).   Specialty:  Oncology Why:  this Thursday at cancer center Contact information: [place] [address] [phone]             Discharge  Instructions  and  Discharge Medications      Allergies as of 10/22/2017      Reactions   Sulfa Antibiotics       Medication List    STOP taking these medications   oxyCODONE-acetaminophen 5-325 MG tablet Commonly known as:  PERCOCET/ROXICET   simvastatin 20 MG tablet Commonly known as:  ZOCOR   tiZANidine 4 MG tablet Commonly known as:  ZANAFLEX     TAKE these medications   ferrous sulfate 325 (65 FE) MG EC tablet Take 1 tablet (325 mg total) by mouth 2 (two) times daily.   HYDROmorphone 2 MG tablet Commonly known as:  DILAUDID Take 1 tablet (2 mg total) by mouth every 4 (four) hours.   levothyroxine 125 MCG tablet Commonly known as:  SYNTHROID, LEVOTHROID Take 125 mcg by mouth daily before breakfast.   lisinopril 20 MG tablet Commonly known as:  PRINIVIL,ZESTRIL Take 20 mg by mouth daily.   metoprolol succinate 100 MG 24 hr tablet Commonly known as:  TOPROL-XL Take 50  mg by mouth daily. Take with or immediately following a meal.   omeprazole 40 MG capsule Commonly known as:  PRILOSEC Take 1 capsule (40 mg total) by mouth 2 (two) times daily before a meal.   oxyCODONE 5 MG immediate release tablet Commonly known as:  Oxy IR/ROXICODONE Take 1 tablet (5 mg total) by mouth every 4 (four) hours as needed for moderate pain.   pioglitazone 45 MG tablet Commonly known as:  ACTOS Take 45 mg by mouth daily.   protein supplement shake Liqd Commonly known as:  PREMIER PROTEIN Take 325 mLs (11 oz total) by mouth daily.   sucralfate 1 g tablet Commonly known as:  CARAFATE Take 1 tablet (1 g total) by mouth 4 (four) times daily.   TOUJEO SOLOSTAR 300 UNIT/ML Sopn Generic drug:  Insulin Glargine Inject 15 Units into the skin daily. What changed:  how much to take   venlafaxine XR 75 MG 24 hr capsule Commonly known as:  EFFEXOR-XR Take 75 mg by mouth daily with breakfast.            Durable Medical Equipment  (From admission, onward)        Start      Ordered   10/19/17 1124  For home use only DME continuous positive airway pressure (CPAP)  Once    Comments:  At bedtime or naps  Question Answer Comment  Patient has OSA or probable OSA Yes   Settings Other see comments   CPAP supplies needed Mask, headgear, cushions, filters, heated tubing and water chamber      10/19/17 1124        Diet and Activity recommendation: See Discharge Instructions above   Consults obtained -oncology, gastroenterology   Major procedures and Radiology Reports - PLEASE review detailed and final reports for all details, in brief -      Dg Chest 1 View  Result Date: 10/15/2017 CLINICAL DATA:  Upper abdominal pain EXAM: CHEST 1 VIEW COMPARISON:  09/07/2016 FINDINGS: The heart size and mediastinal contours are within normal limits. Both lungs are clear. The visualized skeletal structures are unremarkable. IMPRESSION: No active disease. Electronically Signed   By: Donavan Foil M.D.   On: 10/15/2017 22:48   Nm Pulmonary Perf And Vent  Result Date: 10/16/2017 CLINICAL DATA:  Chest pain and shortness of breath, high clinical suspicion of pulmonary embolism EXAM: NUCLEAR MEDICINE VENTILATION - PERFUSION LUNG SCAN TECHNIQUE: Ventilation images were obtained in multiple projections using inhaled aerosol Tc-4m DTPA. Perfusion images were obtained in multiple projections after intravenous injection of Tc-5m-MAA. RADIOPHARMACEUTICALS:  34.104 mCi of Tc-30m DTPA aerosol inhalation and 4.008 mCi Tc12m-MAA IV COMPARISON:  None Correlation: Chest radiograph 10/15/2017 FINDINGS: Ventilation: No focal ventilation defects. Minimal central airway deposition of aerosol. Small amount of swallowed aerosol within stomach. Perfusion: Single small subsegmental perfusion defect at if RIGHT upper lobe. No other definite focal perfusion abnormalities. Chest radiograph: Clear lungs.  Normal heart size. IMPRESSION: Very low probability for pulmonary embolism. Electronically Signed   By:  Lavonia Dana M.D.   On: 10/16/2017 16:41   US Venous Img Lower Bilateral  Result Date: 10/17/2017 CLINICAL DATA:  Bilateral lower extremity pain and edema, right greater than left. History of prior DVT and IVC filter placement. EXAM: BILATERAL LOWER EXTREMITY VENOUS DOPPLER ULTRASOUND TECHNIQUE: Gray-scale sonography with graded compression, as well as color Doppler and duplex ultrasound were performed to evaluate the lower extremity deep venous systems from the level of the common femoral vein and including the  common femoral, femoral, profunda femoral, popliteal and calf veins including the posterior tibial, peroneal and gastrocnemius veins when visible. The superficial great saphenous vein was also interrogated. Spectral Doppler was utilized to evaluate flow at rest and with distal augmentation maneuvers in the common femoral, femoral and popliteal veins. COMPARISON:  Right lower extremity venous Doppler - 09/23/2017; 09/17/2017; 08/29/2017; CT scan abdomen pelvis-10/16/2017 FINDINGS: RIGHT LOWER EXTREMITY There is mixed echogenic occlusive DVT extending from the right common femoral vein through the saphenofemoral junction, the imaged portions of the deep femoral vein, the proximal, mid and distal aspects of the right femoral vein, the popliteal vein and extending to involve the imaged portions of the tibial veins. Other Findings:  None. LEFT LOWER EXTREMITY There is mixed echogenic occlusive DVT extending from the left common femoral vein through the saphenofemoral junction, the imaged portions of the deep femoral vein, the proximal, mid and distal aspects of the left femoral vein, the popliteal vein and extending to involve the imaged portions of the tibial veins. Other Findings:  None. IMPRESSION: Examination is positive for extensive occlusive bilateral lower extremity DVT involving the entirety of the bilateral lower extremity venous system. Note, bilateral pelvic venous and IVC thrombus was seen on  preceding abdominal CT performed 10/16/2017. These results will be called to the ordering clinician or representative by the Radiologist Assistant, and communication documented in the PACS or zVision Dashboard. Electronically Signed   By: Sandi Mariscal M.D.   On: 10/17/2017 11:02   US Venous Img Lower Unilateral Right  Result Date: 09/23/2017 CLINICAL DATA:  Right lower extremity pain and swelling. EXAM: Right LOWER EXTREMITY VENOUS DOPPLER ULTRASOUND TECHNIQUE: Gray-scale sonography with graded compression, as well as color Doppler and duplex ultrasound were performed to evaluate the lower extremity deep venous systems from the level of the common femoral vein and including the common femoral, femoral, profunda femoral, popliteal and calf veins including the posterior tibial, peroneal and gastrocnemius veins when visible. The superficial great saphenous vein was also interrogated. Spectral Doppler was utilized to evaluate flow at rest and with distal augmentation maneuvers in the common femoral, femoral and popliteal veins. COMPARISON:  Ultrasound of September 17, 2017. FINDINGS: Contralateral Common Femoral Vein: Respiratory phasicity is normal and symmetric with the symptomatic side. No evidence of thrombus. Normal compressibility. Common Femoral Vein: Noncompressible with no flow consistent with occlusive thrombus. Saphenofemoral Junction: Noncompressible with no flow consistent with occlusive thrombus. Profunda Femoral Vein: Noncompressible with no flow consistent with occlusive thrombus. Femoral Vein: Noncompressible with no flow consistent with occlusive thrombus. Popliteal Vein: Noncompressible with no flow consistent with occlusive thrombus. Calf Veins: Posterior tibial and peroneal veins are noncompressible with no flow consistent with occlusive thrombus. Venous Reflux:  None. Other Findings:  None. IMPRESSION: There is again noted occlusive deep venous thrombosis of the right common femoral, superficial  femoral, profunda femoral, popliteal, posterior tibial and peroneal veins. Electronically Signed   By: Marijo Conception, M.D.   On: 09/23/2017 02:37   Ct Biopsy  Result Date: 10/17/2017 INDICATION: Right lower quadrant omental mass, unknown primary EXAM: CT-GUIDED BIOPSY RIGHT LOWER QUADRANT OMENTAL MASS MEDICATIONS: 1% LIDOCAINE LOCAL ANESTHESIA/SEDATION: 2.0 mg IV Versed; 75 mcg IV Fentanyl Moderate Sedation Time:  11 MINUTES The patient was continuously monitored during the procedure by the interventional radiology nurse under my direct supervision. PROCEDURE: The procedure, risks, benefits, and alternatives were explained to the patient. Questions regarding the procedure were encouraged and answered. The patient understands and consents to the procedure. Previous imaging  reviewed. Patient positioned supine. Noncontrast localization CT performed. The right lower quadrant omental mass anterior to the cecum was localized. Overlying skin marked. Under sterile conditions and local anesthesia, a 17 gauge 6.8 cm access needle was advanced from a lateral approach to the right lower quadrant omental mass. Needle position confirmed with CT. 18 gauge core biopsies obtained. Samples placed in formalin. Needle removed. Postprocedure imaging demonstrates no hemorrhage or hematoma. Patient tolerated the procedure well without complication. Vital sign monitoring by nursing staff during the procedure will continue as patient is in the special procedures unit for post procedure observation. FINDINGS: The images document guide needle placement within the right lower quadrant omental mass. Post biopsy images demonstrate no hemorrhage or hematoma. COMPLICATIONS: None immediate. IMPRESSION: Successful CT-guided core biopsy of the right lower quadrant omental mass Electronically Signed   By: Jerilynn Mages.  Shick M.D.   On: 10/17/2017 13:14   Ct Angio Abd/pel W And/or Wo Contrast  Result Date: 10/16/2017 CLINICAL DATA:  62 year old female  with history of pancreatitis presents with leg and abdominal pain. EXAM: CTA ABDOMEN AND PELVIS wITHOUT AND WITH CONTRAST TECHNIQUE: Multidetector CT imaging of the abdomen and pelvis was performed using the standard protocol during bolus administration of intravenous contrast. Multiplanar reconstructed images and MIPs were obtained and reviewed to evaluate the vascular anatomy. CONTRAST:  128mL ISOVUE-370 IOPAMIDOL (ISOVUE-370) INJECTION 76% COMPARISON:  Abdominal CT dated 02/07/2017 FINDINGS: VASCULAR Aorta: Mild atherosclerotic calcification. No aneurysmal dilatation or dissection. Celiac: Patent without evidence of aneurysm, dissection, vasculitis or significant stenosis. SMA: Patent without evidence of aneurysm, dissection, vasculitis or significant stenosis. Renals: Both renal arteries are patent without evidence of aneurysm, dissection, vasculitis, fibromuscular dysplasia or significant stenosis. IMA: Patent without evidence of aneurysm, dissection, vasculitis or significant stenosis. Inflow: Mild atherosclerotic calcification of the iliac vessels. No aneurysmal dilatation or dissection. The iliac arteries are patent bilaterally. Proximal Outflow: Bilateral common femoral and visualized portions of the superficial and profunda femoral arteries are patent without evidence of aneurysm, dissection, vasculitis or significant stenosis. Veins: Extensive near occlusive thrombus extending from the visualized portions of the common femoral veins, external and common iliac veins and IVC to the level of the IVC filter. There is small amount of contrast in the peripheral lumen of these vessels. The suprarenal IVC filled with contrast and appears patent. No definite thrombus identified above the level of the IVC filter. The SMV, the SMV, and main portal vein are patent. 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 No portal venous gas. Review of the MIP images confirms the above  findings. NON-VASCULAR Lower chest: Multiple bilateral pulmonary nodules most consistent with metastatic disease. An infectious process or septic emboli are less likely. Clinical correlation is recommended. Partially visualized area of lower attenuation in the right lower lobe/infrahilar vascular confluence (series 6, image 5). This is incompletely evaluated and may represent volume averaging artifact with perihilar fat or scarring. Acute pulmonary embolus is not entirely excluded. Clinical correlation is recommended. If there is clinical concern for acute PE further evaluation with CTA of the chest or V/Q scan recommended. There is no intra-abdominal free air. Small upper abdominal ascites as well as small amount of fluid within the pelvis. Hepatobiliary: An ill-defined focal area of heterogeneous enhancement is seen in the left lobe of the liver (series 6, image 23). The liver is otherwise unremarkable. No intrahepatic biliary ductal dilatation. Cholecystectomy. Pancreas: There is fatty infiltration of the uncinate and head of the pancreas. There is prominence of  the soft tissues of the distal body and tail of the pancreas which may represent extension of the previously seen pancreatic mass or pancreatitis. Correlation with pancreatic enzymes recommended. Spleen: Multiple small splenic hypodense lesions appear new or more prominent compared to the prior CT and concerning for extension of pancreatic neoplasm. Areas of infarct is less likely but not excluded. Adrenals/Urinary Tract: The adrenal glands are unremarkable. The kidneys, and the visualized ureters appear unremarkable as well. The urinary bladder is predominantly collapsed. There is apparent diffuse thickening of the bladder wall which may be partly related to underdistention. Cystitis is not excluded. Correlation with urinalysis recommended. Stomach/Bowel: Postsurgical changes of bowel with anastomotic suture in the sigmoid colon. There is sigmoid  diverticulosis without active inflammatory changes. Moderate amount of stool noted throughout the colon no bowel obstruction. Normal appendix. Lymphatic: Mildly enlarged lymph node in the porta hepaticas measuring 14 mm in short axis. Multiple mildly rounded small retroperitoneal and para-aortic lymph nodes. Reproductive: Hysterectomy. Other: Extensive omental implants and nodularity, new compared to prior CT and consistent with omental caking. Musculoskeletal: Degenerative changes of the spine. No acute osseous pathology. IMPRESSION: VASCULAR 1. Extensive near complete occlusive thrombus extending from the common femoral veins to the IVC at the level of the IVC filter. No definite thrombus noted above the IVC. 2. Partially visualized small low-attenuation in the right lower lobe/right infrahilar pulmonary artery confluence may represent perivascular hilar fat or scarring. Acute PE is not entirely excluded. CTA of the chest or V/Q scan may provide better evaluation if there is clinical concern for acute PE. 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 NON-VASCULAR 1. Enlargement of the distal body and tail of the pancreas likely from extension of the previously seen pancreatic mass. 2. Metastatic disease including omental implant, splenic lesions, and pulmonary nodules. Small ascites, likely malignant. Further evaluation with PET-CT and oncological consult is advised. 3. Mildly enlarged porta hepatic lymph node as well as slightly rounded retroperitoneal and para-aortic lymph nodes. 4. Focal ill-defined heterogeneous enhancement of the left lobe of the liver. These results were called by telephone at the time of interpretation on 10/16/2017 at 1:28 am to Dr. Jannifer Franklin, who verbally acknowledged these results. Electronically Signed   By: Anner Crete M.D.   On: 10/16/2017 01:29    Micro Results     No results found for this or any previous visit (from the past 240 hour(s)).     Today   Subjective:    Ann Larson today has no headache,no chest abdominal pain,no new weakness tingling or numbness, feels much better wants to go home today.   Objective:   Blood pressure (!) 97/52, pulse 97, temperature 98 F (36.7 C), temperature source Oral, resp. rate 16, height 5\' 9"  (1.753 m), weight 105.7 kg (233 lb 0.4 oz), SpO2 92 %.   Intake/Output Summary (Last 24 hours) at 10/22/2017 1319 Last data filed at 10/22/2017 0400 Gross per 24 hour  Intake 480 ml  Output -  Net 480 ml    Exam Awake Alert, Oriented x 3, No new F.N deficits, Normal affect [place].AT,PERRAL Supple Neck,No JVD, No cervical lymphadenopathy appriciated.  Symmetrical Chest wall movement, Good air movement bilaterally, CTAB RRR,No Gallops,Rubs or new Murmurs, No Parasternal Heave +ve B.Sounds, Abd Soft, Non tender, No organomegaly appriciated, No rebound -guarding or rigidity. No Cyanosis, Clubbing or edema, No new Rash or bruise  Data Review   CBC w Diff:  Lab Results  Component Value Date   WBC 14.1 (H) 10/21/2017  HGB 8.3 (L) 10/21/2017   HGB 13.1 05/07/2012   HCT 26.7 (L) 10/21/2017   HCT 39.9 05/07/2012   PLT 355 10/21/2017   PLT 378 05/07/2012   LYMPHOPCT 18 10/15/2017   LYMPHOPCT 39.3 05/07/2012   MONOPCT 10 10/15/2017   MONOPCT 7.5 05/07/2012   EOSPCT 2 10/15/2017   EOSPCT 6.5 05/07/2012   BASOPCT 0 10/15/2017   BASOPCT 0.4 05/07/2012    CMP:  Lab Results  Component Value Date   NA 137 10/19/2017   NA 142 05/07/2012   K 4.5 10/19/2017   K 3.9 05/07/2012   CL 107 10/19/2017   CL 105 05/07/2012   CO2 20 (L) 10/19/2017   CO2 30 05/07/2012   BUN 41 (H) 10/19/2017   BUN 22 (H) 05/07/2012   CREATININE 1.45 (H) 10/19/2017   CREATININE 1.40 (H) 05/07/2012   PROT 7.6 10/15/2017   PROT 7.6 06/06/2012   ALBUMIN 3.1 (L) 10/15/2017   ALBUMIN 3.6 06/06/2012   BILITOT 0.2 (L) 10/15/2017   BILITOT 0.3 06/06/2012   ALKPHOS 85 10/15/2017   ALKPHOS 98 06/06/2012   AST 33 10/15/2017   AST 19  06/06/2012   ALT 20 10/15/2017   ALT 27 06/06/2012  .   Total Time in preparing paper work, data evaluation and todays exam - 43 minutes  Epifanio Lesches M.D on 10/22/2017 at 1:19 PM    Note: This dictation was prepared with Dragon dictation along with smaller phrase technology. Any transcriptional errors that result from this process are unintentional.

## 2017-10-23 ENCOUNTER — Other Ambulatory Visit: Payer: Self-pay | Admitting: Oncology

## 2017-10-23 ENCOUNTER — Other Ambulatory Visit: Payer: BLUE CROSS/BLUE SHIELD

## 2017-10-23 ENCOUNTER — Ambulatory Visit: Payer: BLUE CROSS/BLUE SHIELD | Admitting: Oncology

## 2017-10-23 DIAGNOSIS — Z7189 Other specified counseling: Secondary | ICD-10-CM | POA: Insufficient documentation

## 2017-10-23 DIAGNOSIS — D649 Anemia, unspecified: Secondary | ICD-10-CM

## 2017-10-23 NOTE — Patient Instructions (Signed)
Nanoparticle Albumin-Bound Paclitaxel injection What is this medicine? NANOPARTICLE ALBUMIN-BOUND PACLITAXEL (Na no PAHR ti kuhl al BYOO muhn-bound PAK li TAX el) is a chemotherapy drug. It targets fast dividing cells, like cancer cells, and causes these cells to die. This medicine is used to treat advanced breast cancer and advanced lung cancer. This medicine may be used for other purposes; ask your health care provider or pharmacist if you have questions. COMMON BRAND NAME(S): Abraxane What should I tell my health care provider before I take this medicine? They need to know if you have any of these conditions: -kidney disease -liver disease -low blood counts, like low platelets, red blood cells, or white blood cells -recent or ongoing radiation therapy -an unusual or allergic reaction to paclitaxel, albumin, other chemotherapy, other medicines, foods, dyes, or preservatives -pregnant or trying to get pregnant -breast-feeding How should I use this medicine? This drug is given as an infusion into a vein. It is administered in a hospital or clinic by a specially trained health care professional. Talk to your pediatrician regarding the use of this medicine in children. Special care may be needed. Overdosage: If you think you have taken too much of this medicine contact a poison control center or emergency room at once. NOTE: This medicine is only for you. Do not share this medicine with others. What if I miss a dose? It is important not to miss your dose. Call your doctor or health care professional if you are unable to keep an appointment. What may interact with this medicine? -cyclosporine -diazepam -ketoconazole -medicines to increase blood counts like filgrastim, pegfilgrastim, sargramostim -other chemotherapy drugs like cisplatin, doxorubicin, epirubicin, etoposide, teniposide, vincristine -quinidine -testosterone -vaccines -verapamil Talk to your doctor or health care professional  before taking any of these medicines: -acetaminophen -aspirin -ibuprofen -ketoprofen -naproxen This list may not describe all possible interactions. Give your health care provider a list of all the medicines, herbs, non-prescription drugs, or dietary supplements you use. Also tell them if you smoke, drink alcohol, or use illegal drugs. Some items may interact with your medicine. What should I watch for while using this medicine? Your condition will be monitored carefully while you are receiving this medicine. You will need important blood work done while you are taking this medicine. This medicine can cause serious allergic reactions. If you experience allergic reactions like skin rash, itching or hives, swelling of the face, lips, or tongue, tell your doctor or health care professional right away. In some cases, you may be given additional medicines to help with side effects. Follow all directions for their use. This drug may make you feel generally unwell. This is not uncommon, as chemotherapy can affect healthy cells as well as cancer cells. Report any side effects. Continue your course of treatment even though you feel ill unless your doctor tells you to stop. Call your doctor or health care professional for advice if you get a fever, chills or sore throat, or other symptoms of a cold or flu. Do not treat yourself. This drug decreases your body's ability to fight infections. Try to avoid being around people who are sick. This medicine may increase your risk to bruise or bleed. Call your doctor or health care professional if you notice any unusual bleeding. Be careful brushing and flossing your teeth or using a toothpick because you may get an infection or bleed more easily. If you have any dental work done, tell your dentist you are receiving this medicine. Avoid taking products that  notice any unusual bleeding.  Be careful brushing and flossing your teeth or using a toothpick because you may get an infection or bleed more easily. If you have any dental work done, tell your dentist you are receiving this medicine.  Avoid taking products that contain aspirin, acetaminophen, ibuprofen, naproxen, or ketoprofen unless instructed by your doctor. These  medicines may hide a fever.  Do not become pregnant while taking this medicine. Women should inform their doctor if they wish to become pregnant or think they might be pregnant. There is a potential for serious side effects to an unborn child. Talk to your health care professional or pharmacist for more information. Do not breast-feed an infant while taking this medicine.  Men are advised not to father a child while receiving this medicine.  What side effects may I notice from receiving this medicine?  Side effects that you should report to your doctor or health care professional as soon as possible:  -allergic reactions like skin rash, itching or hives, swelling of the face, lips, or tongue  -low blood counts - This drug may decrease the number of white blood cells, red blood cells and platelets. You may be at increased risk for infections and bleeding.  -signs of infection - fever or chills, cough, sore throat, pain or difficulty passing urine  -signs of decreased platelets or bleeding - bruising, pinpoint red spots on the skin, black, tarry stools, nosebleeds  -signs of decreased red blood cells - unusually weak or tired, fainting spells, lightheadedness  -breathing problems  -changes in vision  -chest pain  -high or low blood pressure  -mouth sores  -nausea and vomiting  -pain, swelling, redness or irritation at the injection site  -pain, tingling, numbness in the hands or feet  -slow or irregular heartbeat  -swelling of the ankle, feet, hands  Side effects that usually do not require medical attention (report to your doctor or health care professional if they continue or are bothersome):  -aches, pains  -changes in the color of fingernails  -diarrhea  -hair loss  -loss of appetite  This list may not describe all possible side effects. Call your doctor for medical advice about side effects. You may report side effects to FDA at 1-800-FDA-1088.  Where should I keep my medicine?  This drug is given in a hospital  or clinic and will not be stored at home.  NOTE: This sheet is a summary. It may not cover all possible information. If you have questions about this medicine, talk to your doctor, pharmacist, or health care provider.   2018 [employer] Standard (2015-06-09 10:05:20)  Gemcitabine injection  What is this medicine?  GEMCITABINE (jem SIT a been) is a chemotherapy drug. This medicine is used to treat many types of cancer like breast cancer, lung cancer, pancreatic cancer, and ovarian cancer.  This medicine may be used for other purposes; ask your health care provider or pharmacist if you have questions.  COMMON BRAND NAME(S): Gemzar  What should I tell my health care provider before I take this medicine?  They need to know if you have any of these conditions:  -blood disorders  -infection  -kidney disease  -liver disease  -recent or ongoing radiation therapy  -an unusual or allergic reaction to gemcitabine, other chemotherapy, other medicines, foods, dyes, or preservatives  -pregnant or trying to get pregnant  -breast-feeding  How should I use this medicine?  This drug is given as an infusion into a vein. It is administered   in a hospital or clinic by a specially trained health care professional.  Talk to your pediatrician regarding the use of this medicine in children. Special care may be needed.  Overdosage: If you think you have taken too much of this medicine contact a poison control center or emergency room at once.  NOTE: This medicine is only for you. Do not share this medicine with others.  What if I miss a dose?  It is important not to miss your dose. Call your doctor or health care professional if you are unable to keep an appointment.  What may interact with this medicine?  -medicines to increase blood counts like filgrastim, pegfilgrastim, sargramostim  -some other chemotherapy drugs like cisplatin  -vaccines  Talk to your doctor or health care professional before taking any of these  medicines:  -acetaminophen  -aspirin  -ibuprofen  -ketoprofen  -naproxen  This list may not describe all possible interactions. Give your health care provider a list of all the medicines, herbs, non-prescription drugs, or dietary supplements you use. Also tell them if you smoke, drink alcohol, or use illegal drugs. Some items may interact with your medicine.  What should I watch for while using this medicine?  Visit your doctor for checks on your progress. This drug may make you feel generally unwell. This is not uncommon, as chemotherapy can affect healthy cells as well as cancer cells. Report any side effects. Continue your course of treatment even though you feel ill unless your doctor tells you to stop.  In some cases, you may be given additional medicines to help with side effects. Follow all directions for their use.  Call your doctor or health care professional for advice if you get a fever, chills or sore throat, or other symptoms of a cold or flu. Do not treat yourself. This drug decreases your body's ability to fight infections. Try to avoid being around people who are sick.  This medicine may increase your risk to bruise or bleed. Call your doctor or health care professional if you notice any unusual bleeding.  Be careful brushing and flossing your teeth or using a toothpick because you may get an infection or bleed more easily. If you have any dental work done, tell your dentist you are receiving this medicine.  Avoid taking products that contain aspirin, acetaminophen, ibuprofen, naproxen, or ketoprofen unless instructed by your doctor. These medicines may hide a fever.  Women should inform their doctor if they wish to become pregnant or think they might be pregnant. There is a potential for serious side effects to an unborn child. Talk to your health care professional or pharmacist for more information. Do not breast-feed an infant while taking this medicine.  What side effects may I notice from  receiving this medicine?  Side effects that you should report to your doctor or health care professional as soon as possible:  -allergic reactions like skin rash, itching or hives, swelling of the face, lips, or tongue  -low blood counts - this medicine may decrease the number of white blood cells, red blood cells and platelets. You may be at increased risk for infections and bleeding.  -signs of infection - fever or chills, cough, sore throat, pain or difficulty passing urine  -signs of decreased platelets or bleeding - bruising, pinpoint red spots on the skin, black, tarry stools, blood in the urine  -signs of decreased red blood cells - unusually weak or tired, fainting spells, lightheadedness  -breathing problems  -chest pain  -  mouth sores  -nausea and vomiting  -pain, swelling, redness at site where injected  -pain, tingling, numbness in the hands or feet  -stomach pain  -swelling of ankles, feet, hands  -unusual bleeding  Side effects that usually do not require medical attention (report to your doctor or health care professional if they continue or are bothersome):  -constipation  -diarrhea  -hair loss  -loss of appetite  -stomach upset  This list may not describe all possible side effects. Call your doctor for medical advice about side effects. You may report side effects to FDA at 1-800-FDA-1088.  Where should I keep my medicine?  This drug is given in a hospital or clinic and will not be stored at home.  NOTE: This sheet is a summary. It may not cover all possible information. If you have questions about this medicine, talk to your doctor, pharmacist, or health care provider.   2018 [employer] Standard (2007-12-17 18:45:54)

## 2017-10-24 ENCOUNTER — Inpatient Hospital Stay: Payer: BLUE CROSS/BLUE SHIELD

## 2017-10-24 ENCOUNTER — Other Ambulatory Visit: Payer: Self-pay

## 2017-10-24 ENCOUNTER — Encounter: Payer: Self-pay | Admitting: Oncology

## 2017-10-24 ENCOUNTER — Inpatient Hospital Stay: Payer: BLUE CROSS/BLUE SHIELD | Attending: Oncology | Admitting: Oncology

## 2017-10-24 ENCOUNTER — Ambulatory Visit: Payer: BLUE CROSS/BLUE SHIELD

## 2017-10-24 VITALS — BP 101/56 | HR 80 | Temp 98.0°F | Resp 16 | Ht 69.0 in | Wt 228.7 lb

## 2017-10-24 DIAGNOSIS — I82413 Acute embolism and thrombosis of femoral vein, bilateral: Secondary | ICD-10-CM | POA: Insufficient documentation

## 2017-10-24 DIAGNOSIS — K5903 Drug induced constipation: Secondary | ICD-10-CM | POA: Insufficient documentation

## 2017-10-24 DIAGNOSIS — D500 Iron deficiency anemia secondary to blood loss (chronic): Secondary | ICD-10-CM | POA: Diagnosis not present

## 2017-10-24 DIAGNOSIS — C259 Malignant neoplasm of pancreas, unspecified: Secondary | ICD-10-CM | POA: Diagnosis present

## 2017-10-24 DIAGNOSIS — Z86718 Personal history of other venous thrombosis and embolism: Secondary | ICD-10-CM | POA: Diagnosis not present

## 2017-10-24 DIAGNOSIS — D649 Anemia, unspecified: Secondary | ICD-10-CM | POA: Diagnosis not present

## 2017-10-24 DIAGNOSIS — C7800 Secondary malignant neoplasm of unspecified lung: Secondary | ICD-10-CM | POA: Insufficient documentation

## 2017-10-24 DIAGNOSIS — G893 Neoplasm related pain (acute) (chronic): Secondary | ICD-10-CM | POA: Insufficient documentation

## 2017-10-24 DIAGNOSIS — R634 Abnormal weight loss: Secondary | ICD-10-CM | POA: Diagnosis not present

## 2017-10-24 DIAGNOSIS — L03116 Cellulitis of left lower limb: Secondary | ICD-10-CM | POA: Insufficient documentation

## 2017-10-24 DIAGNOSIS — L02416 Cutaneous abscess of left lower limb: Secondary | ICD-10-CM | POA: Insufficient documentation

## 2017-10-24 DIAGNOSIS — R530 Neoplastic (malignant) related fatigue: Secondary | ICD-10-CM | POA: Diagnosis not present

## 2017-10-24 DIAGNOSIS — K250 Acute gastric ulcer with hemorrhage: Secondary | ICD-10-CM | POA: Insufficient documentation

## 2017-10-24 DIAGNOSIS — Z7189 Other specified counseling: Secondary | ICD-10-CM

## 2017-10-24 DIAGNOSIS — R112 Nausea with vomiting, unspecified: Secondary | ICD-10-CM | POA: Diagnosis not present

## 2017-10-24 DIAGNOSIS — C787 Secondary malignant neoplasm of liver and intrahepatic bile duct: Secondary | ICD-10-CM | POA: Diagnosis not present

## 2017-10-24 DIAGNOSIS — Z5111 Encounter for antineoplastic chemotherapy: Secondary | ICD-10-CM | POA: Diagnosis present

## 2017-10-24 DIAGNOSIS — R531 Weakness: Secondary | ICD-10-CM | POA: Diagnosis not present

## 2017-10-24 DIAGNOSIS — E876 Hypokalemia: Secondary | ICD-10-CM | POA: Diagnosis not present

## 2017-10-24 DIAGNOSIS — K8689 Other specified diseases of pancreas: Secondary | ICD-10-CM

## 2017-10-24 LAB — COMPREHENSIVE METABOLIC PANEL
ALBUMIN: 2.8 g/dL — AB (ref 3.5–5.0)
ALT: 40 U/L (ref 14–54)
ANION GAP: 8 (ref 5–15)
AST: 24 U/L (ref 15–41)
Alkaline Phosphatase: 131 U/L — ABNORMAL HIGH (ref 38–126)
BUN: 50 mg/dL — ABNORMAL HIGH (ref 6–20)
CO2: 24 mmol/L (ref 22–32)
Calcium: 11.7 mg/dL — ABNORMAL HIGH (ref 8.9–10.3)
Chloride: 106 mmol/L (ref 101–111)
Creatinine, Ser: 1.27 mg/dL — ABNORMAL HIGH (ref 0.44–1.00)
GFR calc Af Amer: 52 mL/min — ABNORMAL LOW (ref >60)
GFR calc non Af Amer: 45 mL/min — ABNORMAL LOW (ref >60)
GLUCOSE: 164 mg/dL — AB (ref 65–99)
POTASSIUM: 4.8 mmol/L (ref 3.5–5.1)
SODIUM: 138 mmol/L (ref 135–145)
TOTAL PROTEIN: 7.8 g/dL (ref 6.5–8.1)
Total Bilirubin: 0.6 mg/dL (ref 0.3–1.2)

## 2017-10-24 LAB — CBC WITH DIFFERENTIAL/PLATELET
BASOS ABS: 0.1 10*3/uL (ref 0–0.1)
BASOS PCT: 1 %
Eosinophils Absolute: 0.3 10*3/uL (ref 0–0.7)
Eosinophils Relative: 2 %
HEMATOCRIT: 25.8 % — AB (ref 35.0–47.0)
HEMOGLOBIN: 8 g/dL — AB (ref 12.0–16.0)
Lymphocytes Relative: 16 %
Lymphs Abs: 2 10*3/uL (ref 1.0–3.6)
MCH: 28 pg (ref 26.0–34.0)
MCHC: 31 g/dL — AB (ref 32.0–36.0)
MCV: 90.5 fL (ref 80.0–100.0)
MONOS PCT: 8 %
Monocytes Absolute: 1 10*3/uL — ABNORMAL HIGH (ref 0.2–0.9)
NEUTROS ABS: 9.2 10*3/uL — AB (ref 1.4–6.5)
Neutrophils Relative %: 73 %
Platelets: 374 10*3/uL (ref 150–440)
RBC: 2.85 MIL/uL — ABNORMAL LOW (ref 3.80–5.20)
RDW: 17.2 % — ABNORMAL HIGH (ref 11.5–14.5)
WBC: 12.6 10*3/uL — ABNORMAL HIGH (ref 3.6–11.0)

## 2017-10-24 LAB — SAMPLE TO BLOOD BANK

## 2017-10-24 MED ORDER — SODIUM CHLORIDE 0.9 % IV SOLN
Freq: Once | INTRAVENOUS | Status: AC
  Administered 2017-10-24: 15:00:00 via INTRAVENOUS
  Filled 2017-10-24: qty 1000

## 2017-10-24 MED ORDER — SENNA 8.6 MG PO TABS: 2.0000 | ORAL_TABLET | Freq: Every day | ORAL | 3 refills | Status: DC

## 2017-10-24 NOTE — Progress Notes (Signed)
Hematology/Oncology Follow Up Note [facility]  Telephone:([phone] [phone]  Patient Care Team: Lorelee Market, MD as PCP - General (Family Medicine)   Name of the patient: Ann Larson  [id number]  27-Jan-1956   REASON FOR VISIT Follow up hospital discharge.  HISTORY OF PRESENT ILLNESS Ann Larson is a  62 y.o.  female with PMH listed below who was referred to me for follow up after hospital discharge. I saw patient during her recent admission.  This is a 62 year old female with history of  right lower extremity DVT in January 2019, GI bleeding due to anticoagulation, status post IVC filter who presents to emergency room with episode of epigastric abdominal pain, shortness of breath, fatigue, and near syncope.  Hemoglobin was found to be 6.6.  Denies seeing blood in the stool or having black stool. # 08/29/2017.She developed right lower extremity occlusive cough and popliteal DVT extending into the distal femoral vein on  She was started on Xarelto and had subsequent GI bleeding.  On 1/ 28/2019 she presented with symptomatic anemia, GI bleeding.  She had another follow-up US venous lower extremity done,which showed propagation of deep vein thrombosis of the right lower extremity now involving right common femoral and femoral veins in addition to previously identified right popliteal and calf veins.  #EGD showed gastritis with hemorrhage, treated with argon plasma coagulation.  Anticoagulation was held and patient had IVC filter placed. [name] was hold.  09/23/17 She had a repeat right lower extremity venous Doppler done which showed occlusive DVT of the right common femoral, superficial femoral, profunda femoral, popliteal, posterior tibial  Veins. # 10/15/2017 Patient continues to have right thigh pain and discomfort and abdominal pain which triggered a CT abdomen pelvis angiogram which showed extensive near complete occlusive thrombus  extending from the common femoral vein to the IVC at the level of IVC filter, no definite thrombus noted above I VC, enlarged distal body and tail of the pancreas likely from extension of the previously seen pancreatic mass, metastatic disease including omental implant, splenic lesions, pulmonary nodules.  Small a situs.  Mildly enlarged porta hepatic lymph node as well as slightly rounded retroperitoneal and periaortic lymph nodes.  Focal ill-defined heterogeneous enhancement of the left lobe of the liver. # Biopsy of omental nodule pathology revealed metastatic pancreatic cancer, CA 19.9 is 124,491, consistent with metastatic pancreatic cancer.    INTERVAL HISTORY Patient presents for follow up . Accompanied with daughter and sister. Continue of have left lower extremity pain,. She takes oxycodone 5mg  every 4 hours needed.. Constipated, takes Miralax daily, last BM was three days ago.   Opiate induced constipation: yes, on Miralax Pain:5 out of 10  Review of systems Review of Systems  Constitutional: Positive for malaise/fatigue and weight loss. Negative for chills and fever.  HENT: Negative for nosebleeds.   Eyes: Negative for photophobia and pain.  Respiratory: Negative for cough, sputum production and shortness of breath.   Cardiovascular: Positive for leg swelling. Negative for chest pain.  Gastrointestinal: Positive for blood in stool. Negative for nausea and vomiting.  Genitourinary: Negative for dysuria.  Musculoskeletal: Negative for myalgias and neck pain.  Skin: Negative for rash.  Neurological: Positive for weakness. Negative for dizziness, tingling and tremors.  Endo/Heme/Allergies: Does not bruise/bleed easily.  Psychiatric/Behavioral: Negative for depression and substance abuse.    Allergies  Allergen Reactions  . Sulfa Antibiotics      Past Medical History:  Diagnosis Date  . Depression   .  Diabetes mellitus without complication ([place])   . Diverticulitis   . DVT  (deep vein thrombosis) in pregnancy ([place]) 08/29/2017   Right popliteal and femoral  . GERD (gastroesophageal reflux disease)   . Hypercalcemia 10/22/2017  . Hyperlipidemia   . Hypertension   . Hypothyroidism   . Obesity   . Pancreatic cancer ([place]) 10/22/2017     Past Surgical History:  Procedure Laterality Date  . ABDOMINAL HYSTERECTOMY  2005  . CHOLECYSTECTOMY  2015  . COLONOSCOPY WITH PROPOFOL N/A 07/01/2015   Procedure: COLONOSCOPY WITH PROPOFOL;  Surgeon: Josefine Class, MD;  Location: [facility] ENDOSCOPY;  Service: Endoscopy;  Laterality: N/A;  . ESOPHAGOGASTRODUODENOSCOPY (EGD) WITH PROPOFOL N/A 07/01/2015   Procedure: ESOPHAGOGASTRODUODENOSCOPY (EGD) WITH PROPOFOL;  Surgeon: Josefine Class, MD;  Location: [facility] ENDOSCOPY;  Service: Endoscopy;  Laterality: N/A;  . ESOPHAGOGASTRODUODENOSCOPY (EGD) WITH PROPOFOL N/A 09/08/2017   Procedure: ESOPHAGOGASTRODUODENOSCOPY (EGD) WITH PROPOFOL;  Surgeon: Lucilla Lame, MD;  Location: [facility] ENDOSCOPY;  Service: Endoscopy;  Laterality: N/A;  . ESOPHAGOGASTRODUODENOSCOPY (EGD) WITH PROPOFOL N/A 09/21/2017   Procedure: ESOPHAGOGASTRODUODENOSCOPY (EGD) WITH PROPOFOL;  Surgeon: Lucilla Lame, MD;  Location: [facility] ENDOSCOPY;  Service: Endoscopy;  Laterality: N/A;  . ESOPHAGOGASTRODUODENOSCOPY (EGD) WITH PROPOFOL N/A 10/18/2017   Procedure: ESOPHAGOGASTRODUODENOSCOPY (EGD) WITH PROPOFOL;  Surgeon: [name], MD;  Location: [facility] ENDOSCOPY;  Service: Gastroenterology;  Laterality: N/A;  . IVC FILTER INSERTION N/A 09/08/2017   Procedure: IVC FILTER INSERTION;  Surgeon: Algernon Huxley, MD;  Location: [facility] LAB;  Service: Cardiovascular;  Laterality: N/A;    Social History   Socioeconomic History  . Marital status: Married    Spouse name: Not on file  . Number of children: Not on file  . Years of education: Not on file  . Highest education level: Not on file  Social Needs  . Financial resource strain: Not on file  . Food insecurity - worry:  Not on file  . Food insecurity - inability: Not on file  . Transportation needs - medical: Not on file  . Transportation needs - non-medical: Not on file  Occupational History  . Not on file  Tobacco Use  . Smoking status: Never Smoker  . Smokeless tobacco: Never Used  Substance and Sexual Activity  . Alcohol use: No  . Drug use: No  . Sexual activity: Not on file  Other Topics Concern  . Not on file  Social History Narrative  . Not on file    Family History  Problem Relation Age of Onset  . Deep vein thrombosis Neg Hx      Current Outpatient Medications:  .  ferrous sulfate 325 (65 FE) MG EC tablet, Take 1 tablet (325 mg total) by mouth 2 (two) times daily., Disp: 60 tablet, Rfl: 1 .  HYDROmorphone (DILAUDID) 2 MG tablet, Take 1 tablet (2 mg total) by mouth every 4 (four) hours., Disp: 30 tablet, Rfl: 0 .  levothyroxine (SYNTHROID, LEVOTHROID) 125 MCG tablet, Take 125 mcg by mouth daily before breakfast., Disp: , Rfl:  .  lisinopril (PRINIVIL,ZESTRIL) 20 MG tablet, Take 20 mg by mouth daily., Disp: , Rfl: 1 .  metoprolol succinate (TOPROL-XL) 100 MG 24 hr tablet, Take 50 mg by mouth daily. Take with or immediately following a meal. , Disp: , Rfl:  .  omeprazole (PRILOSEC) 40 MG capsule, Take 1 capsule (40 mg total) by mouth 2 (two) times daily before a meal., Disp: 60 capsule, Rfl: 0 .  ondansetron (ZOFRAN) 8 MG tablet, Take 1 tablet (  8 mg total) by mouth 2 (two) times daily as needed (Nausea or vomiting)., Disp: 30 tablet, Rfl: 1 .  oxyCODONE (OXY IR/ROXICODONE) 5 MG immediate release tablet, Take 1 tablet (5 mg total) by mouth every 4 (four) hours as needed for moderate pain., Disp: 30 tablet, Rfl: 0 .  pioglitazone (ACTOS) 45 MG tablet, Take 45 mg by mouth daily., Disp: , Rfl: 0 .  prochlorperazine (COMPAZINE) 10 MG tablet, Take 1 tablet (10 mg total) by mouth every 6 (six) hours as needed (Nausea or vomiting)., Disp: 30 tablet, Rfl: 1 .  protein supplement shake (PREMIER  PROTEIN) LIQD, Take 325 mLs (11 oz total) by mouth daily., Disp: 30 Can, Rfl: 0 .  sucralfate (CARAFATE) 1 g tablet, Take 1 tablet (1 g total) by mouth 4 (four) times daily., Disp: 120 tablet, Rfl: 5 .  TOUJEO SOLOSTAR 300 UNIT/ML SOPN, Inject 15 Units into the skin daily., Disp: 3 mL, Rfl: 4 .  venlafaxine XR (EFFEXOR-XR) 75 MG 24 hr capsule, Take 75 mg by mouth daily with breakfast., Disp: , Rfl:   Physical exam:  Vitals:   10/24/17 1333 10/24/17 1347  BP:  (!) 101/56  Pulse:  80  Resp: 16   Temp:  98 F (36.7 C)  TempSrc:  Tympanic  Weight: 228 lb 11.2 oz (103.7 kg)   Height: 5\' 9"  (1.753 m)    Physical Exam  Constitutional: She is oriented to person, place, and time and well-developed, well-nourished, and in no distress. No distress.  HENT:  Head: Normocephalic and atraumatic.  Mouth/Throat: No oropharyngeal exudate.  Eyes: Pupils are equal, round, and reactive to light. No scleral icterus.  pallor  Neck: Normal range of motion. Neck supple.  Cardiovascular: Normal rate and regular rhythm.  Pulmonary/Chest: Effort normal and breath sounds normal. She has no wheezes. She has no rales.  Abdominal: Bowel sounds are normal. She exhibits no mass.  Generalized discomfort with palpation.   Musculoskeletal: Normal range of motion. She exhibits no edema.  Lymphadenopathy:    She has no cervical adenopathy.  Neurological: She is alert and oriented to person, place, and time.  Skin: Skin is warm and dry.  Psychiatric: Affect and judgment normal.    CMP Latest Ref Rng & Units 10/24/2017  Glucose 65 - 99 mg/dL 164(H)  BUN 6 - 20 mg/dL 50(H)  Creatinine 0.44 - 1.00 mg/dL 1.27(H)  Sodium 135 - 145 mmol/L 138  Potassium 3.5 - 5.1 mmol/L 4.8  Chloride 101 - 111 mmol/L 106  CO2 22 - 32 mmol/L 24  Calcium 8.9 - 10.3 mg/dL 11.7(H)  Total Protein 6.5 - 8.1 g/dL 7.8  Total Bilirubin 0.3 - 1.2 mg/dL 0.6  Alkaline Phos 38 - 126 U/L 131(H)  AST 15 - 41 U/L 24  ALT 14 - 54 U/L 40   CBC  Latest Ref Rng & Units 10/24/2017  WBC 3.6 - 11.0 K/uL 12.6(H)  Hemoglobin 12.0 - 16.0 g/dL 8.0(L)  Hematocrit 35.0 - 47.0 % 25.8(L)  Platelets 150 - 440 K/uL 374    Dg Chest 1 View  Result Date: 10/15/2017 CLINICAL DATA:  Upper abdominal pain EXAM: CHEST 1 VIEW COMPARISON:  09/07/2016 FINDINGS: The heart size and mediastinal contours are within normal limits. Both lungs are clear. The visualized skeletal structures are unremarkable. IMPRESSION: No active disease. Electronically Signed   By: Donavan Foil M.D.   On: 10/15/2017 22:48   Nm Pulmonary Perf And Vent  Result Date: 10/16/2017 CLINICAL DATA:  Chest pain and  shortness of breath, high clinical suspicion of pulmonary embolism EXAM: NUCLEAR MEDICINE VENTILATION - PERFUSION LUNG SCAN TECHNIQUE: Ventilation images were obtained in multiple projections using inhaled aerosol Tc-87m DTPA. Perfusion images were obtained in multiple projections after intravenous injection of Tc-3m-MAA. RADIOPHARMACEUTICALS:  34.104 mCi of Tc-46m DTPA aerosol inhalation and 4.008 mCi Tc92m-MAA IV COMPARISON:  None Correlation: Chest radiograph 10/15/2017 FINDINGS: Ventilation: No focal ventilation defects. Minimal central airway deposition of aerosol. Small amount of swallowed aerosol within stomach. Perfusion: Single small subsegmental perfusion defect at if RIGHT upper lobe. No other definite focal perfusion abnormalities. Chest radiograph: Clear lungs.  Normal heart size. IMPRESSION: Very low probability for pulmonary embolism. Electronically Signed   By: Lavonia Dana M.D.   On: 10/16/2017 16:41   US Venous Img Lower Bilateral  Result Date: 10/17/2017 CLINICAL DATA:  Bilateral lower extremity pain and edema, right greater than left. History of prior DVT and IVC filter placement. EXAM: BILATERAL LOWER EXTREMITY VENOUS DOPPLER ULTRASOUND TECHNIQUE: Gray-scale sonography with graded compression, as well as color Doppler and duplex ultrasound were performed to evaluate  the lower extremity deep venous systems from the level of the common femoral vein and including the common femoral, femoral, profunda femoral, popliteal and calf veins including the posterior tibial, peroneal and gastrocnemius veins when visible. The superficial great saphenous vein was also interrogated. Spectral Doppler was utilized to evaluate flow at rest and with distal augmentation maneuvers in the common femoral, femoral and popliteal veins. COMPARISON:  Right lower extremity venous Doppler - 09/23/2017; 09/17/2017; 08/29/2017; CT scan abdomen pelvis-10/16/2017 FINDINGS: RIGHT LOWER EXTREMITY There is mixed echogenic occlusive DVT extending from the right common femoral vein through the saphenofemoral junction, the imaged portions of the deep femoral vein, the proximal, mid and distal aspects of the right femoral vein, the popliteal vein and extending to involve the imaged portions of the tibial veins. Other Findings:  None. LEFT LOWER EXTREMITY There is mixed echogenic occlusive DVT extending from the left common femoral vein through the saphenofemoral junction, the imaged portions of the deep femoral vein, the proximal, mid and distal aspects of the left femoral vein, the popliteal vein and extending to involve the imaged portions of the tibial veins. Other Findings:  None. IMPRESSION: Examination is positive for extensive occlusive bilateral lower extremity DVT involving the entirety of the bilateral lower extremity venous system. Note, bilateral pelvic venous and IVC thrombus was seen on preceding abdominal CT performed 10/16/2017. These results will be called to the ordering clinician or representative by the Radiologist Assistant, and communication documented in the PACS or zVision Dashboard. Electronically Signed   By: Sandi Mariscal M.D.   On: 10/17/2017 11:02   Ct Biopsy  Result Date: 10/17/2017 INDICATION: Right lower quadrant omental mass, unknown primary EXAM: CT-GUIDED BIOPSY RIGHT LOWER  QUADRANT OMENTAL MASS MEDICATIONS: 1% LIDOCAINE LOCAL ANESTHESIA/SEDATION: 2.0 mg IV Versed; 75 mcg IV Fentanyl Moderate Sedation Time:  11 MINUTES The patient was continuously monitored during the procedure by the interventional radiology nurse under my direct supervision. PROCEDURE: The procedure, risks, benefits, and alternatives were explained to the patient. Questions regarding the procedure were encouraged and answered. The patient understands and consents to the procedure. Previous imaging reviewed. Patient positioned supine. Noncontrast localization CT performed. The right lower quadrant omental mass anterior to the cecum was localized. Overlying skin marked. Under sterile conditions and local anesthesia, a 17 gauge 6.8 cm access needle was advanced from a lateral approach to the right lower quadrant omental mass. Needle position confirmed with  CT. 18 gauge core biopsies obtained. Samples placed in formalin. Needle removed. Postprocedure imaging demonstrates no hemorrhage or hematoma. Patient tolerated the procedure well without complication. Vital sign monitoring by nursing staff during the procedure will continue as patient is in the special procedures unit for post procedure observation. FINDINGS: The images document guide needle placement within the right lower quadrant omental mass. Post biopsy images demonstrate no hemorrhage or hematoma. COMPLICATIONS: None immediate. IMPRESSION: Successful CT-guided core biopsy of the right lower quadrant omental mass Electronically Signed   By: Jerilynn Mages.  Shick M.D.   On: 10/17/2017 13:14   Ct Angio Abd/pel W And/or Wo Contrast  Result Date: 10/16/2017 CLINICAL DATA:  62 year old female with history of pancreatitis presents with leg and abdominal pain. EXAM: CTA ABDOMEN AND PELVIS wITHOUT AND WITH CONTRAST TECHNIQUE: Multidetector CT imaging of the abdomen and pelvis was performed using the standard protocol during bolus administration of intravenous contrast.  Multiplanar reconstructed images and MIPs were obtained and reviewed to evaluate the vascular anatomy. CONTRAST:  19mL ISOVUE-370 IOPAMIDOL (ISOVUE-370) INJECTION 76% COMPARISON:  Abdominal CT dated 02/07/2017 FINDINGS: VASCULAR Aorta: Mild atherosclerotic calcification. No aneurysmal dilatation or dissection. Celiac: Patent without evidence of aneurysm, dissection, vasculitis or significant stenosis. SMA: Patent without evidence of aneurysm, dissection, vasculitis or significant stenosis. Renals: Both renal arteries are patent without evidence of aneurysm, dissection, vasculitis, fibromuscular dysplasia or significant stenosis. IMA: Patent without evidence of aneurysm, dissection, vasculitis or significant stenosis. Inflow: Mild atherosclerotic calcification of the iliac vessels. No aneurysmal dilatation or dissection. The iliac arteries are patent bilaterally. Proximal Outflow: Bilateral common femoral and visualized portions of the superficial and profunda femoral arteries are patent without evidence of aneurysm, dissection, vasculitis or significant stenosis. Veins: Extensive near occlusive thrombus extending from the visualized portions of the common femoral veins, external and common iliac veins and IVC to the level of the IVC filter. There is small amount of contrast in the peripheral lumen of these vessels. The suprarenal IVC filled with contrast and appears patent. No definite thrombus identified above the level of the IVC filter. The SMV, the SMV, and main portal vein are patent. 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 No portal venous gas. Review of the MIP images confirms the above findings. NON-VASCULAR Lower chest: Multiple bilateral pulmonary nodules most consistent with metastatic disease. An infectious process or septic emboli are less likely. Clinical correlation is recommended. Partially visualized area of lower attenuation in the right lower  lobe/infrahilar vascular confluence (series 6, image 5). This is incompletely evaluated and may represent volume averaging artifact with perihilar fat or scarring. Acute pulmonary embolus is not entirely excluded. Clinical correlation is recommended. If there is clinical concern for acute PE further evaluation with CTA of the chest or V/Q scan recommended. There is no intra-abdominal free air. Small upper abdominal ascites as well as small amount of fluid within the pelvis. Hepatobiliary: An ill-defined focal area of heterogeneous enhancement is seen in the left lobe of the liver (series 6, image 23). The liver is otherwise unremarkable. No intrahepatic biliary ductal dilatation. Cholecystectomy. Pancreas: There is fatty infiltration of the uncinate and head of the pancreas. There is prominence of the soft tissues of the distal body and tail of the pancreas which may represent extension of the previously seen pancreatic mass or pancreatitis. Correlation with pancreatic enzymes recommended. Spleen: Multiple small splenic hypodense lesions appear new or more prominent compared to the prior CT and concerning for extension of pancreatic neoplasm. Areas  of infarct is less likely but not excluded. Adrenals/Urinary Tract: The adrenal glands are unremarkable. The kidneys, and the visualized ureters appear unremarkable as well. The urinary bladder is predominantly collapsed. There is apparent diffuse thickening of the bladder wall which may be partly related to underdistention. Cystitis is not excluded. Correlation with urinalysis recommended. Stomach/Bowel: Postsurgical changes of bowel with anastomotic suture in the sigmoid colon. There is sigmoid diverticulosis without active inflammatory changes. Moderate amount of stool noted throughout the colon no bowel obstruction. Normal appendix. Lymphatic: Mildly enlarged lymph node in the porta hepaticas measuring 14 mm in short axis. Multiple mildly rounded small retroperitoneal  and para-aortic lymph nodes. Reproductive: Hysterectomy. Other: Extensive omental implants and nodularity, new compared to prior CT and consistent with omental caking. Musculoskeletal: Degenerative changes of the spine. No acute osseous pathology. IMPRESSION: VASCULAR 1. Extensive near complete occlusive thrombus extending from the common femoral veins to the IVC at the level of the IVC filter. No definite thrombus noted above the IVC. 2. Partially visualized small low-attenuation in the right lower lobe/right infrahilar pulmonary artery confluence may represent perivascular hilar fat or scarring. Acute PE is not entirely excluded. CTA of the chest or V/Q scan may provide better evaluation if there is clinical concern for acute PE. 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 NON-VASCULAR 1. Enlargement of the distal body and tail of the pancreas likely from extension of the previously seen pancreatic mass. 2. Metastatic disease including omental implant, splenic lesions, and pulmonary nodules. Small ascites, likely malignant. Further evaluation with PET-CT and oncological consult is advised. 3. Mildly enlarged porta hepatic lymph node as well as slightly rounded retroperitoneal and para-aortic lymph nodes. 4. Focal ill-defined heterogeneous enhancement of the left lobe of the liver. These results were called by telephone at the time of interpretation on 10/16/2017 at 1:28 am to Dr. Jannifer Franklin, who verbally acknowledged these results. Electronically Signed   By: Anner Crete M.D.   On: 10/16/2017 01:29      Assessment and plan- Patient is a28 y.o.femalewith history of hemorraghic gastritis, extensivebilaterallower extremity DVT not on anticoagulation, newly diagnosed metastatic pancreatic cancer presents for follow up.   1. Malignant neoplasm of pancreas, unspecified location of malignancy ([place])   2. Goals of care, counseling/discussion   3. Hypercalcemia   4. Acute deep vein thrombosis (DVT) of femoral  vein of both lower extremities ([facility])   5. Acute gastric ulcer with hemorrhage   6. Symptomatic anemia   7. Iron deficiency anemia due to chronic blood loss    # Metastatic pancreatic cancer,  involving lung, liver, omental caking. Pathology results were discussed with patient and family members. Goal of care is palliative treatment to decrease Systemic chemotherapy is recommended. I would hold off getting a medi port given her hypercoagulable state (see below). Gemcitabine and Abraxane can be potentially given via peripheral IV line.   I explained to the patient the risks and benefits of chemotherapy including all but not limited to hair loss, mouth sore, nausea, vomiting, low blood counts, bleeding, and risk of life threatening infection and even death, secondary malignancy etc.  Risk of neuropathy is associated with abraxane # Chemotherapy education; Start chemo on 10/25/2017 # Hypercalcemia: likely due to metastatic pancreatic cancer, Calcium at 11.7. Delton See was not approved by insurance. Peer to peer review was made, still does not approve Xgeva. Change to Zometa  3mg  x 1, will be given tomorrow. Will give 1 liter of IV fluid.   # symptomatic anemia, continue monitor,  S/p EGD which revealed bleeding ulcer, treated with homospray. Hemoglobin is stable.  # Iron deficiency anemia secondary to blood loss: will need to arrange additional dose of Venofer outpatient.   # Extensivebilaterallower extremity DVT: anticoagulation is contraindicated at this point due to ongoing blood loss. DVT maybe related to her metastatic tumor burden.  vascular surgeon recommendation noted. No intervention at this point.  .  Total face to face encounter time for this patient visit was 40 min. >50% of the time was  spent in counseling and coordination of care.   Earlie Server, MD, PhD Hematology Oncology [facility] Pager- [phone] 10/24/2017

## 2017-10-24 NOTE — Progress Notes (Signed)
Patient here for initial treatment. She has severe pain in her left leg, edema. She reprots that she become short of breath with exertion.

## 2017-10-25 ENCOUNTER — Inpatient Hospital Stay (HOSPITAL_BASED_OUTPATIENT_CLINIC_OR_DEPARTMENT_OTHER): Payer: BLUE CROSS/BLUE SHIELD | Admitting: Oncology

## 2017-10-25 ENCOUNTER — Encounter: Payer: Self-pay | Admitting: Oncology

## 2017-10-25 ENCOUNTER — Inpatient Hospital Stay: Payer: BLUE CROSS/BLUE SHIELD

## 2017-10-25 ENCOUNTER — Other Ambulatory Visit: Payer: BLUE CROSS/BLUE SHIELD

## 2017-10-25 ENCOUNTER — Other Ambulatory Visit: Payer: Self-pay

## 2017-10-25 ENCOUNTER — Ambulatory Visit: Payer: BLUE CROSS/BLUE SHIELD | Admitting: Oncology

## 2017-10-25 VITALS — BP 101/68 | HR 84 | Resp 20

## 2017-10-25 VITALS — BP 98/65 | HR 93 | Temp 97.4°F | Resp 20 | Wt 228.0 lb

## 2017-10-25 DIAGNOSIS — D500 Iron deficiency anemia secondary to blood loss (chronic): Secondary | ICD-10-CM | POA: Diagnosis not present

## 2017-10-25 DIAGNOSIS — I82413 Acute embolism and thrombosis of femoral vein, bilateral: Secondary | ICD-10-CM

## 2017-10-25 DIAGNOSIS — C259 Malignant neoplasm of pancreas, unspecified: Secondary | ICD-10-CM

## 2017-10-25 DIAGNOSIS — D649 Anemia, unspecified: Secondary | ICD-10-CM | POA: Diagnosis not present

## 2017-10-25 DIAGNOSIS — C7800 Secondary malignant neoplasm of unspecified lung: Secondary | ICD-10-CM | POA: Diagnosis not present

## 2017-10-25 DIAGNOSIS — C787 Secondary malignant neoplasm of liver and intrahepatic bile duct: Secondary | ICD-10-CM

## 2017-10-25 DIAGNOSIS — K250 Acute gastric ulcer with hemorrhage: Secondary | ICD-10-CM | POA: Diagnosis not present

## 2017-10-25 MED ORDER — GEMCITABINE HCL CHEMO INJECTION 1 GM/26.3ML
2200.0000 mg | Freq: Once | INTRAVENOUS | Status: AC
  Administered 2017-10-25: 2200 mg via INTRAVENOUS
  Filled 2017-10-25: qty 52.6

## 2017-10-25 MED ORDER — SODIUM CHLORIDE 0.9 % IV SOLN
Freq: Once | INTRAVENOUS | Status: AC
  Administered 2017-10-25: 13:00:00 via INTRAVENOUS
  Filled 2017-10-25: qty 1000

## 2017-10-25 MED ORDER — PROCHLORPERAZINE MALEATE 10 MG PO TABS
10.0000 mg | ORAL_TABLET | Freq: Once | ORAL | Status: AC
  Administered 2017-10-25: 10 mg via ORAL

## 2017-10-25 MED ORDER — IRON SUCROSE 20 MG/ML IV SOLN
200.0000 mg | Freq: Once | INTRAVENOUS | Status: AC
  Administered 2017-10-25: 200 mg via INTRAVENOUS
  Filled 2017-10-25: qty 10

## 2017-10-25 MED ORDER — PACLITAXEL PROTEIN-BOUND CHEMO INJECTION 100 MG
125.0000 mg/m2 | Freq: Once | INTRAVENOUS | Status: AC
  Administered 2017-10-25: 275 mg via INTRAVENOUS
  Filled 2017-10-25: qty 55

## 2017-10-25 MED ORDER — ZOLEDRONIC ACID 4 MG/5ML IV CONC
3.0000 mg | Freq: Once | INTRAVENOUS | Status: AC
  Administered 2017-10-25: 3 mg via INTRAVENOUS
  Filled 2017-10-25: qty 3.75

## 2017-10-25 NOTE — Progress Notes (Signed)
Patient was unable to complete Gemzar infusion due to her arm burning.  Dr. Tasia Catchings is aware.  Patient will be scheduled to have PICC placement asap.  Patient will be notified by Dr. Collie Siad team.

## 2017-10-25 NOTE — Progress Notes (Signed)
Hematology/Oncology Follow Up Note [facility]  [phone] [phone]  Patient Care Team: Lorelee Market, MD as PCP - General (Family Medicine)   Name of the patient: Ann Larson  [id number]  21-Aug-1956   REASON FOR VISIT Follow up hospital discharge.  HISTORY OF PRESENT ILLNESS Ann Larson is a  62 y.o.  female with PMH listed below who was referred to me for follow up after hospital discharge. I saw patient during her recent admission.  This is a 62 year old female with history of  right lower extremity DVT in January 2019, GI bleeding due to anticoagulation, status post IVC filter who presents to emergency room with episode of epigastric abdominal pain, shortness of breath, fatigue, and near syncope.  Hemoglobin was found to be 6.6.  Denies seeing blood in the stool or having black stool. # 08/29/2017.She developed right lower extremity occlusive cough and popliteal DVT extending into the distal femoral vein on  She was started on Xarelto and had subsequent GI bleeding.  On 1/ 28/2019 she presented with symptomatic anemia, GI bleeding.  She had another follow-up US venous lower extremity done,which showed propagation of deep vein thrombosis of the right lower extremity now involving right common femoral and femoral veins in addition to previously identified right popliteal and calf veins.  #EGD showed gastritis with hemorrhage, treated with argon plasma coagulation.  Anticoagulation was held and patient had IVC filter placed. Ann Larson was hold.  09/23/17 She had a repeat right lower extremity venous Doppler done which showed occlusive DVT of the right common femoral, superficial femoral, profunda femoral, popliteal, posterior tibial  Veins. # 10/15/2017 Patient continues to have right thigh pain and discomfort and abdominal pain which triggered a CT abdomen pelvis angiogram which showed extensive near complete occlusive thrombus  extending from the common femoral vein to the IVC at the level of IVC filter, no definite thrombus noted above I VC, enlarged distal body and tail of the pancreas likely from extension of the previously seen pancreatic mass, metastatic disease including omental implant, splenic lesions, pulmonary nodules.  Small a situs.  Mildly enlarged porta hepatic lymph node as well as slightly rounded retroperitoneal and periaortic lymph nodes.  Focal ill-defined heterogeneous enhancement of the left lobe of the liver. # Biopsy of omental nodule pathology revealed metastatic pancreatic cancer, CA 19.9 is 124,491, consistent with metastatic pancreatic cancer.    INTERVAL HISTORY Patient presents for assessment prior to cycle 1 Gemcitabine and Abraxane. . Accompanied by husband. . Continue of have left lower extremity pain, feel tired.  She takes oxycodone 5mg  every 4 hours needed.. Constipated, takes Miralax daily, last BM was last night..   Opiate induced constipation: yes, Senna 2 tablets daily, and Miralax PRN Pain:5 out of 10  Review of systems Review of Systems  Constitutional: Positive for malaise/fatigue and weight loss. Negative for chills and fever.  HENT: Negative for nosebleeds.   Eyes: Negative for photophobia and pain.  Respiratory: Negative for cough, sputum production and shortness of breath.   Cardiovascular: Positive for leg swelling. Negative for chest pain.  Gastrointestinal: Positive for blood in stool. Negative for nausea and vomiting.  Genitourinary: Negative for dysuria.  Musculoskeletal: Negative for myalgias and neck pain.  Skin: Negative for rash.  Neurological: Positive for weakness. Negative for dizziness, tingling and tremors.  Endo/Heme/Allergies: Does not bruise/bleed easily.  Psychiatric/Behavioral: Negative for depression and substance abuse.    Allergies  Allergen Reactions  . Sulfa Antibiotics  Past Medical History:  Diagnosis Date  . Depression   .  Diabetes mellitus without complication ([place])   . Diverticulitis   . DVT (deep vein thrombosis) in pregnancy ([place]) 08/29/2017   Right popliteal and femoral  . GERD (gastroesophageal reflux disease)   . Hypercalcemia 10/22/2017  . Hyperlipidemia   . Hypertension   . Hypothyroidism   . Obesity   . Pancreatic cancer ([place]) 10/22/2017     Past Surgical History:  Procedure Laterality Date  . ABDOMINAL HYSTERECTOMY  2005  . CHOLECYSTECTOMY  2015  . COLONOSCOPY WITH PROPOFOL N/A 07/01/2015   Procedure: COLONOSCOPY WITH PROPOFOL;  Surgeon: Josefine Class, MD;  Location: [facility] ENDOSCOPY;  Service: Endoscopy;  Laterality: N/A;  . ESOPHAGOGASTRODUODENOSCOPY (EGD) WITH PROPOFOL N/A 07/01/2015   Procedure: ESOPHAGOGASTRODUODENOSCOPY (EGD) WITH PROPOFOL;  Surgeon: Josefine Class, MD;  Location: [facility] ENDOSCOPY;  Service: Endoscopy;  Laterality: N/A;  . ESOPHAGOGASTRODUODENOSCOPY (EGD) WITH PROPOFOL N/A 09/08/2017   Procedure: ESOPHAGOGASTRODUODENOSCOPY (EGD) WITH PROPOFOL;  Surgeon: Lucilla Lame, MD;  Location: [facility] ENDOSCOPY;  Service: Endoscopy;  Laterality: N/A;  . ESOPHAGOGASTRODUODENOSCOPY (EGD) WITH PROPOFOL N/A 09/21/2017   Procedure: ESOPHAGOGASTRODUODENOSCOPY (EGD) WITH PROPOFOL;  Surgeon: Lucilla Lame, MD;  Location: [facility] ENDOSCOPY;  Service: Endoscopy;  Laterality: N/A;  . ESOPHAGOGASTRODUODENOSCOPY (EGD) WITH PROPOFOL N/A 10/18/2017   Procedure: ESOPHAGOGASTRODUODENOSCOPY (EGD) WITH PROPOFOL;  Surgeon: [name], MD;  Location: [facility] ENDOSCOPY;  Service: Gastroenterology;  Laterality: N/A;  . IVC FILTER INSERTION N/A 09/08/2017   Procedure: IVC FILTER INSERTION;  Surgeon: Algernon Huxley, MD;  Location: [place] [facility] LAB;  Service: Cardiovascular;  Laterality: N/A;    Social History   Socioeconomic History  . Marital status: Married    Spouse name: Not on file  . Number of children: Not on file  . Years of education: Not on file  . Highest education level: Not on file  Social  Needs  . Financial resource strain: Not on file  . Food insecurity - worry: Not on file  . Food insecurity - inability: Not on file  . Transportation needs - medical: Not on file  . Transportation needs - non-medical: Not on file  Occupational History  . Not on file  Tobacco Use  . Smoking status: Never Smoker  . Smokeless tobacco: Never Used  Substance and Sexual Activity  . Alcohol use: No  . Drug use: No  . Sexual activity: Not on file  Other Topics Concern  . Not on file  Social History Narrative  . Not on file    Family History  Problem Relation Age of Onset  . Deep vein thrombosis Neg Hx      Current Outpatient Medications:  .  ferrous sulfate 325 (65 FE) MG EC tablet, Take 1 tablet (325 mg total) by mouth 2 (two) times daily., Disp: 60 tablet, Rfl: 1 .  HYDROmorphone (DILAUDID) 2 MG tablet, Take 1 tablet (2 mg total) by mouth every 4 (four) hours., Disp: 30 tablet, Rfl: 0 .  levothyroxine (SYNTHROID, LEVOTHROID) 125 MCG tablet, Take 125 mcg by mouth daily before breakfast., Disp: , Rfl:  .  lisinopril (PRINIVIL,ZESTRIL) 20 MG tablet, Take 20 mg by mouth daily., Disp: , Rfl: 1 .  metoprolol succinate (TOPROL-XL) 100 MG 24 hr tablet, Take 50 mg by mouth daily. Take with or immediately following a meal. , Disp: , Rfl:  .  omeprazole (PRILOSEC) 40 MG capsule, Take 1 capsule (40 mg total) by mouth 2 (two) times daily before a meal., Disp: 60 capsule,  Rfl: 0 .  oxyCODONE (OXY IR/ROXICODONE) 5 MG immediate release tablet, Take 1 tablet (5 mg total) by mouth every 4 (four) hours as needed for moderate pain., Disp: 30 tablet, Rfl: 0 .  pioglitazone (ACTOS) 45 MG tablet, Take 45 mg by mouth daily., Disp: , Rfl: 0 .  protein supplement shake (PREMIER PROTEIN) LIQD, Take 325 mLs (11 oz total) by mouth daily., Disp: 30 Can, Rfl: 0 .  senna (SENOKOT) 8.6 MG TABS tablet, Take 2 tablets (17.2 mg total) by mouth daily. Hold if loose stools., Disp: 60 each, Rfl: 3 .  sucralfate (CARAFATE)  1 g tablet, Take 1 tablet (1 g total) by mouth 4 (four) times daily., Disp: 120 tablet, Rfl: 5 .  TOUJEO SOLOSTAR 300 UNIT/ML SOPN, Inject 15 Units into the skin daily., Disp: 3 mL, Rfl: 4 .  venlafaxine XR (EFFEXOR-XR) 75 MG 24 hr capsule, Take 75 mg by mouth daily with breakfast., Disp: , Rfl:  .  ondansetron (ZOFRAN) 8 MG tablet, Take 1 tablet (8 mg total) by mouth 2 (two) times daily as needed (Nausea or vomiting). (Patient not taking: Reported on 10/25/2017), Disp: 30 tablet, Rfl: 1 .  prochlorperazine (COMPAZINE) 10 MG tablet, Take 1 tablet (10 mg total) by mouth every 6 (six) hours as needed (Nausea or vomiting). (Patient not taking: Reported on 10/25/2017), Disp: 30 tablet, Rfl: 1 No current facility-administered medications for this visit.   Facility-Administered Medications Ordered in Other Visits:  .  gemcitabine (GEMZAR) 2,200 mg in sodium chloride 0.9 % 250 mL chemo infusion, 2,200 mg, Intravenous, Once, Earlie Server, MD .  PACLitaxel-protein bound (ABRAXANE) chemo infusion 275 mg, 125 mg/m2 (Treatment Plan Recorded), Intravenous, Once, Earlie Server, MD  Physical exam:  Vitals:   10/25/17 0820 10/25/17 0831  BP: 98/65   Pulse: 93   Resp: 20   Temp: (!) 97.4 F (36.3 C)   TempSrc: Tympanic   Weight:  228 lb (103.4 kg)   Physical Exam  Constitutional: She is oriented to person, place, and time and well-developed, well-nourished, and in no distress. No distress.  HENT:  Head: Normocephalic and atraumatic.  Mouth/Throat: Oropharynx is clear and moist. No oropharyngeal exudate.  Eyes: Pupils are equal, round, and reactive to light. Left eye exhibits no discharge. No scleral icterus.  pallor  Neck: Normal range of motion. Neck supple.  Cardiovascular: Normal rate, regular rhythm and normal heart sounds.  Pulmonary/Chest: Effort normal and breath sounds normal. She has no wheezes. She has no rales.  Abdominal: Bowel sounds are normal. She exhibits no mass. There is no rebound and no  guarding.  Generalized discomfort with palpation.   Musculoskeletal: Normal range of motion. She exhibits no edema.  Lymphadenopathy:    She has no cervical adenopathy.  Neurological: She is alert and oriented to person, place, and time. No cranial nerve deficit.  Skin: Skin is warm and dry.  Psychiatric: Affect and judgment normal.    CMP Latest Ref Rng & Units 10/24/2017  Glucose 65 - 99 mg/dL 164(H)  BUN 6 - 20 mg/dL 50(H)  Creatinine 0.44 - 1.00 mg/dL 1.27(H)  Sodium 135 - 145 mmol/L 138  Potassium 3.5 - 5.1 mmol/L 4.8  Chloride 101 - 111 mmol/L 106  CO2 22 - 32 mmol/L 24  Calcium 8.9 - 10.3 mg/dL 11.7(H)  Total Protein 6.5 - 8.1 g/dL 7.8  Total Bilirubin 0.3 - 1.2 mg/dL 0.6  Alkaline Phos 38 - 126 U/L 131(H)  AST 15 - 41 U/L 24  ALT  14 - 54 U/L 40   CBC Latest Ref Rng & Units 10/24/2017  WBC 3.6 - 11.0 K/uL 12.6(H)  Hemoglobin 12.0 - 16.0 g/dL 8.0(L)  Hematocrit 35.0 - 47.0 % 25.8(L)  Platelets 150 - 440 K/uL 374    Dg Chest 1 View  Result Date: 10/15/2017 CLINICAL DATA:  Upper abdominal pain EXAM: CHEST 1 VIEW COMPARISON:  09/07/2016 FINDINGS: The heart size and mediastinal contours are within normal limits. Both lungs are clear. The visualized skeletal structures are unremarkable. IMPRESSION: No active disease. Electronically Signed   By: Donavan Foil M.D.   On: 10/15/2017 22:48   Nm Pulmonary Perf And Vent  Result Date: 10/16/2017 CLINICAL DATA:  Chest pain and shortness of breath, high clinical suspicion of pulmonary embolism EXAM: NUCLEAR MEDICINE VENTILATION - PERFUSION LUNG SCAN TECHNIQUE: Ventilation images were obtained in multiple projections using inhaled aerosol Tc-88m DTPA. Perfusion images were obtained in multiple projections after intravenous injection of Tc-60m-MAA. RADIOPHARMACEUTICALS:  34.104 mCi of Tc-12m DTPA aerosol inhalation and 4.008 mCi Tc61m-MAA IV COMPARISON:  None Correlation: Chest radiograph 10/15/2017 FINDINGS: Ventilation: No focal ventilation  defects. Minimal central airway deposition of aerosol. Small amount of swallowed aerosol within stomach. Perfusion: Single small subsegmental perfusion defect at if RIGHT upper lobe. No other definite focal perfusion abnormalities. Chest radiograph: Clear lungs.  Normal heart size. IMPRESSION: Very low probability for pulmonary embolism. Electronically Signed   By: Lavonia Dana M.D.   On: 10/16/2017 16:41   US Venous Img Lower Bilateral  Result Date: 10/17/2017 CLINICAL DATA:  Bilateral lower extremity pain and edema, right greater than left. History of prior DVT and IVC filter placement. EXAM: BILATERAL LOWER EXTREMITY VENOUS DOPPLER ULTRASOUND TECHNIQUE: Gray-scale sonography with graded compression, as well as color Doppler and duplex ultrasound were performed to evaluate the lower extremity deep venous systems from the level of the common femoral vein and including the common femoral, femoral, profunda femoral, popliteal and calf veins including the posterior tibial, peroneal and gastrocnemius veins when visible. The superficial great saphenous vein was also interrogated. Spectral Doppler was utilized to evaluate flow at rest and with distal augmentation maneuvers in the common femoral, femoral and popliteal veins. COMPARISON:  Right lower extremity venous Doppler - 09/23/2017; 09/17/2017; 08/29/2017; CT scan abdomen pelvis-10/16/2017 FINDINGS: RIGHT LOWER EXTREMITY There is mixed echogenic occlusive DVT extending from the right common femoral vein through the saphenofemoral junction, the imaged portions of the deep femoral vein, the proximal, mid and distal aspects of the right femoral vein, the popliteal vein and extending to involve the imaged portions of the tibial veins. Other Findings:  None. LEFT LOWER EXTREMITY There is mixed echogenic occlusive DVT extending from the left common femoral vein through the saphenofemoral junction, the imaged portions of the deep femoral vein, the proximal, mid and  distal aspects of the left femoral vein, the popliteal vein and extending to involve the imaged portions of the tibial veins. Other Findings:  None. IMPRESSION: Examination is positive for extensive occlusive bilateral lower extremity DVT involving the entirety of the bilateral lower extremity venous system. Note, bilateral pelvic venous and IVC thrombus was seen on preceding abdominal CT performed 10/16/2017. These results will be called to the ordering clinician or representative by the Radiologist Assistant, and communication documented in the PACS or zVision Dashboard. Electronically Signed   By: Sandi Mariscal M.D.   On: 10/17/2017 11:02   Ct Biopsy  Result Date: 10/17/2017 INDICATION: Right lower quadrant omental mass, unknown primary EXAM: CT-GUIDED BIOPSY RIGHT LOWER  QUADRANT OMENTAL MASS MEDICATIONS: 1% LIDOCAINE LOCAL ANESTHESIA/SEDATION: 2.0 mg IV Versed; 75 mcg IV Fentanyl Moderate Sedation Time:  11 MINUTES The patient was continuously monitored during the procedure by the interventional radiology nurse under my direct supervision. PROCEDURE: The procedure, risks, benefits, and alternatives were explained to the patient. Questions regarding the procedure were encouraged and answered. The patient understands and consents to the procedure. Previous imaging reviewed. Patient positioned supine. Noncontrast localization CT performed. The right lower quadrant omental mass anterior to the cecum was localized. Overlying skin marked. Under sterile conditions and local anesthesia, a 17 gauge 6.8 cm access needle was advanced from a lateral approach to the right lower quadrant omental mass. Needle position confirmed with CT. 18 gauge core biopsies obtained. Samples placed in formalin. Needle removed. Postprocedure imaging demonstrates no hemorrhage or hematoma. Patient tolerated the procedure well without complication. Vital sign monitoring by nursing staff during the procedure will continue as patient is in the  special procedures unit for post procedure observation. FINDINGS: The images document guide needle placement within the right lower quadrant omental mass. Post biopsy images demonstrate no hemorrhage or hematoma. COMPLICATIONS: None immediate. IMPRESSION: Successful CT-guided core biopsy of the right lower quadrant omental mass Electronically Signed   By: Jerilynn Mages.  Shick M.D.   On: 10/17/2017 13:14   Ct Angio Abd/pel W And/or Wo Contrast  Result Date: 10/16/2017 CLINICAL DATA:  62 year old female with history of pancreatitis presents with leg and abdominal pain. EXAM: CTA ABDOMEN AND PELVIS wITHOUT AND WITH CONTRAST TECHNIQUE: Multidetector CT imaging of the abdomen and pelvis was performed using the standard protocol during bolus administration of intravenous contrast. Multiplanar reconstructed images and MIPs were obtained and reviewed to evaluate the vascular anatomy. CONTRAST:  166mL ISOVUE-370 IOPAMIDOL (ISOVUE-370) INJECTION 76% COMPARISON:  Abdominal CT dated 02/07/2017 FINDINGS: VASCULAR Aorta: Mild atherosclerotic calcification. No aneurysmal dilatation or dissection. Celiac: Patent without evidence of aneurysm, dissection, vasculitis or significant stenosis. SMA: Patent without evidence of aneurysm, dissection, vasculitis or significant stenosis. Renals: Both renal arteries are patent without evidence of aneurysm, dissection, vasculitis, fibromuscular dysplasia or significant stenosis. IMA: Patent without evidence of aneurysm, dissection, vasculitis or significant stenosis. Inflow: Mild atherosclerotic calcification of the iliac vessels. No aneurysmal dilatation or dissection. The iliac arteries are patent bilaterally. Proximal Outflow: Bilateral common femoral and visualized portions of the superficial and profunda femoral arteries are patent without evidence of aneurysm, dissection, vasculitis or significant stenosis. Veins: Extensive near occlusive thrombus extending from the visualized portions of the  common femoral veins, external and common iliac veins and IVC to the level of the IVC filter. There is small amount of contrast in the peripheral lumen of these vessels. The suprarenal IVC filled with contrast and appears patent. No definite thrombus identified above the level of the IVC filter. The SMV, the SMV, and main portal vein are patent. 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 No portal venous gas. Review of the MIP images confirms the above findings. NON-VASCULAR Lower chest: Multiple bilateral pulmonary nodules most consistent with metastatic disease. An infectious process or septic emboli are less likely. Clinical correlation is recommended. Partially visualized area of lower attenuation in the right lower lobe/infrahilar vascular confluence (series 6, image 5). This is incompletely evaluated and may represent volume averaging artifact with perihilar fat or scarring. Acute pulmonary embolus is not entirely excluded. Clinical correlation is recommended. If there is clinical concern for acute PE further evaluation with CTA of the chest or V/Q scan recommended.  There is no intra-abdominal free air. Small upper abdominal ascites as well as small amount of fluid within the pelvis. Hepatobiliary: An ill-defined focal area of heterogeneous enhancement is seen in the left lobe of the liver (series 6, image 23). The liver is otherwise unremarkable. No intrahepatic biliary ductal dilatation. Cholecystectomy. Pancreas: There is fatty infiltration of the uncinate and head of the pancreas. There is prominence of the soft tissues of the distal body and tail of the pancreas which may represent extension of the previously seen pancreatic mass or pancreatitis. Correlation with pancreatic enzymes recommended. Spleen: Multiple small splenic hypodense lesions appear new or more prominent compared to the prior CT and concerning for extension of pancreatic neoplasm. Areas of infarct  is less likely but not excluded. Adrenals/Urinary Tract: The adrenal glands are unremarkable. The kidneys, and the visualized ureters appear unremarkable as well. The urinary bladder is predominantly collapsed. There is apparent diffuse thickening of the bladder wall which may be partly related to underdistention. Cystitis is not excluded. Correlation with urinalysis recommended. Stomach/Bowel: Postsurgical changes of bowel with anastomotic suture in the sigmoid colon. There is sigmoid diverticulosis without active inflammatory changes. Moderate amount of stool noted throughout the colon no bowel obstruction. Normal appendix. Lymphatic: Mildly enlarged lymph node in the porta hepaticas measuring 14 mm in short axis. Multiple mildly rounded small retroperitoneal and para-aortic lymph nodes. Reproductive: Hysterectomy. Other: Extensive omental implants and nodularity, new compared to prior CT and consistent with omental caking. Musculoskeletal: Degenerative changes of the spine. No acute osseous pathology. IMPRESSION: VASCULAR 1. Extensive near complete occlusive thrombus extending from the common femoral veins to the IVC at the level of the IVC filter. No definite thrombus noted above the IVC. 2. Partially visualized small low-attenuation in the right lower lobe/right infrahilar pulmonary artery confluence may represent perivascular hilar fat or scarring. Acute PE is not entirely excluded. CTA of the chest or V/Q scan may provide better evaluation if there is clinical concern for acute PE. 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 NON-VASCULAR 1. Enlargement of the distal body and tail of the pancreas likely from extension of the previously seen pancreatic mass. 2. Metastatic disease including omental implant, splenic lesions, and pulmonary nodules. Small ascites, likely malignant. Further evaluation with PET-CT and oncological consult is advised. 3. Mildly enlarged porta hepatic lymph node as well as slightly  rounded retroperitoneal and para-aortic lymph nodes. 4. Focal ill-defined heterogeneous enhancement of the left lobe of the liver. These results were called by telephone at the time of interpretation on 10/16/2017 at 1:28 am to Dr. Jannifer Franklin, who verbally acknowledged these results. Electronically Signed   By: Anner Crete M.D.   On: 10/16/2017 01:29      Assessment and plan- Patient is a67 y.o.femalewith history of hemorraghic gastritis, extensivebilaterallower extremity DVT not on anticoagulation, newly diagnosed metastatic pancreatic cancer presents for follow up.   1. Malignant neoplasm of pancreas, unspecified location of malignancy ([place])   2. Symptomatic anemia   3. Hypercalcemia   4. Acute deep vein thrombosis (DVT) of femoral vein of both lower extremities ([facility])   5. Acute gastric ulcer with hemorrhage   6. Iron deficiency anemia due to chronic blood loss    # Metastatic pancreatic cancer,  involving lung, liver, omental caking. Proceed with cycle 1 Gemcitabine and Abraxane.   # Hypercalcemia secondary to metastatic pancreatic cancer, Calcium at 11.7. Delton See was not approved by insurance. Peer to peer review was made, still does not approve Xgeva. Will give  Zometa  3mg  x 1,   # symptomatic anemia, continue monitor, S/p EGD which revealed bleeding ulcer, treated with homospray. Hemoglobin is stable.  # Iron deficiency anemia secondary to blood loss: plan give 2 doses of Venofer outpatient.   # Extensivebilaterallower extremity DVT: anticoagulation is contraindicated at this point due to ongoing blood loss. DVT likely related to her metastatic tumor burden.    Addendum: I was notified by infusion center RN at [facility] that multiple attempts failed for obtaining IV access.  Discussed with patient about her to go to  [facility] and have picc team evaluation.  Discussed with patient that although inserting picc line or mid line have risk of DVT, however,  there is no other better option for her in order to get antineoplasm treatment. Patient voices understanding and will go to [facility] infusion center.   Total face to face encounter time for this patient visit was 40 min. >50% of the time was  spent in counseling and coordination of care.   Follow up visit: 10/29/2017 lab, MD visit and venofer/ possible blood transfusion.  Earlie Server, MD, PhD Hematology Oncology [facility] at [facility] Pager- [phone] 10/25/2017

## 2017-10-25 NOTE — Progress Notes (Signed)
Verified with MD ---wants dose of Zometa 3mg  (flat dose)

## 2017-10-25 NOTE — Progress Notes (Signed)
IV access attempted 6 times by three different nurses.  Patient presented with multiple bruises on both arms from previous IV attempts and insertions.  PICC team consulted.  Patient instructed that she would need to be seen by the PICC team and then have chemotherapy in [place] due to stability of chemotherapy.  Chemotherapy was returned to [facility] with pharmacy tech.  Dr. Tasia Catchings aware and will speak with the patient.

## 2017-10-26 ENCOUNTER — Other Ambulatory Visit: Payer: Self-pay | Admitting: Oncology

## 2017-10-26 ENCOUNTER — Ambulatory Visit
Admission: RE | Admit: 2017-10-26 | Discharge: 2017-10-26 | Disposition: A | Discharge status: Home / Self Care | Payer: BLUE CROSS/BLUE SHIELD | Source: Ambulatory Visit | Attending: Oncology | Admitting: Oncology

## 2017-10-26 ENCOUNTER — Ambulatory Visit
Admission: RE | Admit: 2017-10-26 | Discharge: 2017-10-26 | Disposition: A | Discharge status: Home / Self Care | Payer: Self-pay | Source: Ambulatory Visit | Attending: Oncology | Admitting: Oncology

## 2017-10-26 ENCOUNTER — Inpatient Hospital Stay: Payer: BLUE CROSS/BLUE SHIELD

## 2017-10-26 VITALS — BP 96/62 | HR 89 | Temp 98.0°F | Resp 20

## 2017-10-26 DIAGNOSIS — C259 Malignant neoplasm of pancreas, unspecified: Secondary | ICD-10-CM

## 2017-10-26 MED ORDER — SODIUM CHLORIDE 0.9 % IV SOLN
2200.0000 mg | Freq: Once | INTRAVENOUS | Status: AC
  Administered 2017-10-26: 2200 mg via INTRAVENOUS
  Filled 2017-10-26: qty 52.6

## 2017-10-26 MED ORDER — SODIUM CHLORIDE 0.9 % IV SOLN
Freq: Once | INTRAVENOUS | Status: AC
  Administered 2017-10-26: 15:00:00 via INTRAVENOUS
  Filled 2017-10-26: qty 1000

## 2017-10-26 MED ORDER — OXYCODONE HCL ER 15 MG PO T12A: 15.0000 mg | EXTENDED_RELEASE_TABLET | Freq: Two times a day (BID) | ORAL | 0 refills | Status: DC

## 2017-10-26 MED ORDER — PROCHLORPERAZINE MALEATE 10 MG PO TABS
10.0000 mg | ORAL_TABLET | Freq: Once | ORAL | Status: AC
  Administered 2017-10-26: 10 mg via ORAL
  Filled 2017-10-26: qty 1

## 2017-10-26 MED ORDER — HEPARIN SOD (PORK) LOCK FLUSH 100 UNIT/ML IV SOLN
250.0000 [IU] | Freq: Once | INTRAVENOUS | Status: AC | PRN
  Administered 2017-10-26: 250 [IU]
  Filled 2017-10-26: qty 5

## 2017-10-26 MED ORDER — SODIUM CHLORIDE 0.9% FLUSH: 10.0000 mL | INTRAVENOUS | Status: DC | PRN

## 2017-10-26 NOTE — OR Nursing (Signed)
PICC line in place. Pt discharged via w/c with family to cancer center for treatment.

## 2017-10-26 NOTE — Progress Notes (Signed)
Peripherally Inserted Central Catheter/Midline Placement  The IV Nurse has discussed with the patient and/or persons authorized to consent for the patient, the purpose of this procedure and the potential benefits and risks involved with this procedure.  The benefits include less needle sticks, lab draws from the catheter, and the patient may be discharged home with the catheter. Risks include, but not limited to, infection, bleeding, blood clot (thrombus formation), and puncture of an artery; nerve damage and irregular heartbeat and possibility to perform a PICC exchange if needed/ordered by physician.  Alternatives to this procedure were also discussed.  Bard Power PICC patient education guide, fact sheet on infection prevention and patient information card has been provided to patient /or left at bedside.    PICC/Midline Placement Documentation  PICC Single Lumen 10/26/17 PICC Right Brachial 42 cm 0 cm (Active)  Indication for Insertion or Continuance of Line Poor Vasculature-patient has had multiple peripheral attempts or PIVs lasting less than 24 hours 10/26/2017  2:00 PM  Exposed Catheter (cm) 0 cm 10/26/2017  2:00 PM  Site Assessment Clean;Dry;Intact 10/26/2017  2:00 PM  Line Status Flushed;Saline locked;Blood return noted 10/26/2017  2:00 PM  Dressing Type Transparent;Securing device 10/26/2017  2:00 PM  Dressing Status Clean;Dry;Intact;Antimicrobial disc in place 10/26/2017  2:00 PM  Dressing Change Due 11/02/17 10/26/2017  2:00 PM   Difficulty getting the guidewire to thread into the vein.    Frances Maywood 10/26/2017, 2:25 PM

## 2017-10-26 NOTE — Progress Notes (Signed)
Family reports that patient's pain is not well controlled. Today she is here getting picc line insertion for access and finish her Gemcitabine,. Got a call from RN reporting that patient feels pain not well controlled and asking more pain medication. Advise patient to pick up long acting pain medication (oxycontin 15mg  BID), and use Oxycontin in combination with PRN oxycodone.  Do not use Dilaudid.  Her pain medication will be further titrated.

## 2017-10-29 ENCOUNTER — Inpatient Hospital Stay
Admission: EM | Admit: 2017-10-29 | Discharge: 2017-11-03 | DRG: 811 | Disposition: A | Discharge status: Home / Self Care | Payer: BLUE CROSS/BLUE SHIELD | Attending: Family Medicine | Admitting: Family Medicine

## 2017-10-29 ENCOUNTER — Telehealth: Payer: Self-pay | Admitting: *Deleted

## 2017-10-29 ENCOUNTER — Inpatient Hospital Stay: Payer: BLUE CROSS/BLUE SHIELD

## 2017-10-29 ENCOUNTER — Inpatient Hospital Stay: Payer: BLUE CROSS/BLUE SHIELD | Admitting: Oncology

## 2017-10-29 ENCOUNTER — Encounter: Payer: Self-pay | Admitting: Emergency Medicine

## 2017-10-29 ENCOUNTER — Other Ambulatory Visit: Payer: Self-pay

## 2017-10-29 DIAGNOSIS — Z95828 Presence of other vascular implants and grafts: Secondary | ICD-10-CM

## 2017-10-29 DIAGNOSIS — I10 Essential (primary) hypertension: Secondary | ICD-10-CM | POA: Diagnosis present

## 2017-10-29 DIAGNOSIS — E785 Hyperlipidemia, unspecified: Secondary | ICD-10-CM | POA: Diagnosis present

## 2017-10-29 DIAGNOSIS — Z86718 Personal history of other venous thrombosis and embolism: Secondary | ICD-10-CM

## 2017-10-29 DIAGNOSIS — Z7189 Other specified counseling: Secondary | ICD-10-CM | POA: Diagnosis not present

## 2017-10-29 DIAGNOSIS — D500 Iron deficiency anemia secondary to blood loss (chronic): Secondary | ICD-10-CM | POA: Diagnosis present

## 2017-10-29 DIAGNOSIS — E860 Dehydration: Secondary | ICD-10-CM | POA: Diagnosis not present

## 2017-10-29 DIAGNOSIS — N170 Acute kidney failure with tubular necrosis: Secondary | ICD-10-CM | POA: Diagnosis present

## 2017-10-29 DIAGNOSIS — F329 Major depressive disorder, single episode, unspecified: Secondary | ICD-10-CM | POA: Diagnosis present

## 2017-10-29 DIAGNOSIS — R55 Syncope and collapse: Secondary | ICD-10-CM

## 2017-10-29 DIAGNOSIS — Z79890 Hormone replacement therapy (postmenopausal): Secondary | ICD-10-CM

## 2017-10-29 DIAGNOSIS — C259 Malignant neoplasm of pancreas, unspecified: Secondary | ICD-10-CM | POA: Diagnosis not present

## 2017-10-29 DIAGNOSIS — D62 Acute posthemorrhagic anemia: Secondary | ICD-10-CM | POA: Diagnosis present

## 2017-10-29 DIAGNOSIS — K219 Gastro-esophageal reflux disease without esophagitis: Secondary | ICD-10-CM | POA: Diagnosis present

## 2017-10-29 DIAGNOSIS — E1165 Type 2 diabetes mellitus with hyperglycemia: Secondary | ICD-10-CM | POA: Diagnosis present

## 2017-10-29 DIAGNOSIS — M79609 Pain in unspecified limb: Secondary | ICD-10-CM | POA: Diagnosis not present

## 2017-10-29 DIAGNOSIS — E039 Hypothyroidism, unspecified: Secondary | ICD-10-CM | POA: Diagnosis present

## 2017-10-29 DIAGNOSIS — Z794 Long term (current) use of insulin: Secondary | ICD-10-CM

## 2017-10-29 DIAGNOSIS — E119 Type 2 diabetes mellitus without complications: Secondary | ICD-10-CM | POA: Diagnosis not present

## 2017-10-29 DIAGNOSIS — E1140 Type 2 diabetes mellitus with diabetic neuropathy, unspecified: Secondary | ICD-10-CM | POA: Diagnosis present

## 2017-10-29 DIAGNOSIS — Z9221 Personal history of antineoplastic chemotherapy: Secondary | ICD-10-CM

## 2017-10-29 DIAGNOSIS — Z9049 Acquired absence of other specified parts of digestive tract: Secondary | ICD-10-CM

## 2017-10-29 DIAGNOSIS — Z79899 Other long term (current) drug therapy: Secondary | ICD-10-CM

## 2017-10-29 DIAGNOSIS — C7800 Secondary malignant neoplasm of unspecified lung: Secondary | ICD-10-CM | POA: Diagnosis present

## 2017-10-29 DIAGNOSIS — Z881 Allergy status to other antibiotic agents status: Secondary | ICD-10-CM

## 2017-10-29 DIAGNOSIS — Z90710 Acquired absence of both cervix and uterus: Secondary | ICD-10-CM | POA: Diagnosis not present

## 2017-10-29 DIAGNOSIS — K922 Gastrointestinal hemorrhage, unspecified: Secondary | ICD-10-CM | POA: Diagnosis not present

## 2017-10-29 DIAGNOSIS — Z8507 Personal history of malignant neoplasm of pancreas: Secondary | ICD-10-CM

## 2017-10-29 DIAGNOSIS — D649 Anemia, unspecified: Secondary | ICD-10-CM

## 2017-10-29 DIAGNOSIS — R42 Dizziness and giddiness: Secondary | ICD-10-CM | POA: Diagnosis not present

## 2017-10-29 DIAGNOSIS — C787 Secondary malignant neoplasm of liver and intrahepatic bile duct: Secondary | ICD-10-CM | POA: Diagnosis not present

## 2017-10-29 DIAGNOSIS — R578 Other shock: Secondary | ICD-10-CM | POA: Diagnosis present

## 2017-10-29 DIAGNOSIS — L03116 Cellulitis of left lower limb: Secondary | ICD-10-CM | POA: Diagnosis present

## 2017-10-29 DIAGNOSIS — Z6834 Body mass index (BMI) 34.0-34.9, adult: Secondary | ICD-10-CM

## 2017-10-29 DIAGNOSIS — Z515 Encounter for palliative care: Secondary | ICD-10-CM | POA: Diagnosis not present

## 2017-10-29 DIAGNOSIS — E669 Obesity, unspecified: Secondary | ICD-10-CM | POA: Diagnosis present

## 2017-10-29 DIAGNOSIS — T451X5A Adverse effect of antineoplastic and immunosuppressive drugs, initial encounter: Secondary | ICD-10-CM | POA: Diagnosis present

## 2017-10-29 DIAGNOSIS — R571 Hypovolemic shock: Secondary | ICD-10-CM | POA: Diagnosis present

## 2017-10-29 LAB — BASIC METABOLIC PANEL
Anion gap: 13 (ref 5–15)
BUN: 44 mg/dL — ABNORMAL HIGH (ref 6–20)
CHLORIDE: 104 mmol/L (ref 101–111)
CO2: 18 mmol/L — AB (ref 22–32)
Calcium: 9 mg/dL (ref 8.9–10.3)
Creatinine, Ser: 2.29 mg/dL — ABNORMAL HIGH (ref 0.44–1.00)
GFR calc non Af Amer: 22 mL/min — ABNORMAL LOW (ref >60)
GFR, EST AFRICAN AMERICAN: 25 mL/min — AB (ref >60)
Glucose, Bld: 188 mg/dL — ABNORMAL HIGH (ref 65–99)
POTASSIUM: 4.3 mmol/L (ref 3.5–5.1)
Sodium: 135 mmol/L (ref 135–145)

## 2017-10-29 LAB — HEMOGLOBIN AND HEMATOCRIT, BLOOD
HCT: 22.2 % — ABNORMAL LOW (ref 35.0–47.0)
HEMATOCRIT: 25.6 % — AB (ref 35.0–47.0)
HEMOGLOBIN: 8.1 g/dL — AB (ref 12.0–16.0)
HEMOGLOBIN: 7.1 g/dL — AB (ref 12.0–16.0)

## 2017-10-29 LAB — COMPREHENSIVE METABOLIC PANEL
ALT: 29 U/L (ref 14–54)
AST: 32 U/L (ref 15–41)
Albumin: 2.1 g/dL — ABNORMAL LOW (ref 3.5–5.0)
Alkaline Phosphatase: 91 U/L (ref 38–126)
Anion gap: 9 (ref 5–15)
BUN: 51 mg/dL — ABNORMAL HIGH (ref 6–20)
CALCIUM: 8.4 mg/dL — AB (ref 8.9–10.3)
CO2: 19 mmol/L — ABNORMAL LOW (ref 22–32)
CREATININE: 2.58 mg/dL — AB (ref 0.44–1.00)
Chloride: 108 mmol/L (ref 101–111)
GFR calc Af Amer: 22 mL/min — ABNORMAL LOW (ref >60)
GFR, EST NON AFRICAN AMERICAN: 19 mL/min — AB (ref >60)
GLUCOSE: 164 mg/dL — AB (ref 65–99)
Potassium: 4.1 mmol/L (ref 3.5–5.1)
SODIUM: 136 mmol/L (ref 135–145)
TOTAL PROTEIN: 6.4 g/dL — AB (ref 6.5–8.1)
Total Bilirubin: 2.2 mg/dL — ABNORMAL HIGH (ref 0.3–1.2)

## 2017-10-29 LAB — CBC
HCT: 20.2 % — ABNORMAL LOW (ref 35.0–47.0)
HCT: 22.4 % — ABNORMAL LOW (ref 35.0–47.0)
Hemoglobin: 6.4 g/dL — ABNORMAL LOW (ref 12.0–16.0)
Hemoglobin: 7.2 g/dL — ABNORMAL LOW (ref 12.0–16.0)
MCH: 27.9 pg (ref 26.0–34.0)
MCH: 28.7 pg (ref 26.0–34.0)
MCHC: 31.5 g/dL — ABNORMAL LOW (ref 32.0–36.0)
MCHC: 32.1 g/dL (ref 32.0–36.0)
MCV: 88.5 fL (ref 80.0–100.0)
MCV: 89.5 fL (ref 80.0–100.0)
PLATELETS: 162 10*3/uL (ref 150–440)
Platelets: 196 10*3/uL (ref 150–440)
RBC: 2.29 MIL/uL — AB (ref 3.80–5.20)
RBC: 2.5 MIL/uL — AB (ref 3.80–5.20)
RDW: 18.5 % — ABNORMAL HIGH (ref 11.5–14.5)
RDW: 18.4 % — AB (ref 11.5–14.5)
WBC: 7.6 10*3/uL (ref 3.6–11.0)
WBC: 4.6 10*3/uL (ref 3.6–11.0)

## 2017-10-29 LAB — PREPARE RBC (CROSSMATCH)

## 2017-10-29 LAB — TROPONIN I
Troponin I: <0.03 ng/mL (ref <0.03)
Troponin I: <0.03 ng/mL (ref <0.03)

## 2017-10-29 LAB — GLUCOSE, CAPILLARY
GLUCOSE-CAPILLARY: 125 mg/dL — AB (ref 65–99)
Glucose-Capillary: 143 mg/dL — ABNORMAL HIGH (ref 65–99)

## 2017-10-29 LAB — MAGNESIUM: Magnesium: 2.1 mg/dL (ref 1.7–2.4)

## 2017-10-29 LAB — PHOSPHORUS: Phosphorus: 3.2 mg/dL (ref 2.5–4.6)

## 2017-10-29 LAB — MRSA PCR SCREENING: MRSA BY PCR: NEGATIVE

## 2017-10-29 LAB — LACTIC ACID, PLASMA: Lactic Acid, Venous: 0.8 mmol/L (ref 0.5–1.9)

## 2017-10-29 LAB — PROTIME-INR
INR: 1.17
PROTHROMBIN TIME: 14.8 s (ref 11.4–15.2)

## 2017-10-29 MED ORDER — ACETAMINOPHEN 650 MG RE SUPP: 650.0000 mg | Freq: Four times a day (QID) | RECTAL | Status: DC | PRN

## 2017-10-29 MED ORDER — SODIUM CHLORIDE 0.9% FLUSH
10.0000 mL | Freq: Two times a day (BID) | INTRAVENOUS | Status: DC
  Administered 2017-10-30: 20 mL
  Administered 2017-10-30 – 2017-11-03 (×8): 10 mL

## 2017-10-29 MED ORDER — OXYCODONE HCL 5 MG PO TABS: 5.0000 mg | ORAL_TABLET | ORAL | Status: DC | PRN

## 2017-10-29 MED ORDER — HYDROCODONE-ACETAMINOPHEN 10-325 MG PO TABS: 1.0000 | ORAL_TABLET | ORAL | Status: DC | PRN

## 2017-10-29 MED ORDER — SODIUM CHLORIDE 0.9 % IV BOLUS (SEPSIS)
1000.0000 mL | INTRAVENOUS | Status: AC
  Administered 2017-10-29 (×2): 1000 mL via INTRAVENOUS

## 2017-10-29 MED ORDER — HYDROCODONE-ACETAMINOPHEN 7.5-325 MG/15ML PO SOLN
15.0000 mL | ORAL | Status: DC | PRN
  Administered 2017-10-30 – 2017-10-31 (×9): 15 mL via ORAL
  Filled 2017-10-29 (×9): qty 15

## 2017-10-29 MED ORDER — NOREPINEPHRINE BITARTRATE 1 MG/ML IV SOLN
0.0000 ug/min | INTRAVENOUS | Status: DC
  Filled 2017-10-29 (×2): qty 4

## 2017-10-29 MED ORDER — OXYCODONE HCL ER 15 MG PO T12A
15.0000 mg | EXTENDED_RELEASE_TABLET | Freq: Two times a day (BID) | ORAL | Status: DC
Start: 2017-10-29 — End: 2017-10-29
  Filled 2017-10-29: qty 1

## 2017-10-29 MED ORDER — OXYCODONE HCL ER 15 MG PO T12A: 15.0000 mg | EXTENDED_RELEASE_TABLET | Freq: Two times a day (BID) | ORAL | Status: DC

## 2017-10-29 MED ORDER — SODIUM CHLORIDE 0.9 % IV SOLN
INTRAVENOUS | Status: DC
  Administered 2017-10-29 – 2017-11-01 (×5): via INTRAVENOUS

## 2017-10-29 MED ORDER — VENLAFAXINE HCL ER 75 MG PO CP24
75.0000 mg | ORAL_CAPSULE | Freq: Every day | ORAL | Status: DC
  Administered 2017-10-31 – 2017-11-02 (×2): 75 mg via ORAL
  Filled 2017-10-29 (×4): qty 1

## 2017-10-29 MED ORDER — FENTANYL 50 MCG/HR TD PT72
50.0000 ug | MEDICATED_PATCH | TRANSDERMAL | Status: DC
  Administered 2017-10-30 – 2017-11-01 (×2): 50 ug via TRANSDERMAL
  Filled 2017-10-29 (×2): qty 1

## 2017-10-29 MED ORDER — HYDROCORTISONE NA SUCCINATE PF 100 MG IJ SOLR
100.0000 mg | Freq: Three times a day (TID) | INTRAMUSCULAR | Status: DC
  Administered 2017-10-29 – 2017-10-31 (×5): 100 mg via INTRAVENOUS
  Filled 2017-10-29 (×5): qty 2

## 2017-10-29 MED ORDER — SODIUM CHLORIDE 0.9 % IV SOLN
Freq: Once | INTRAVENOUS | Status: AC
  Administered 2017-10-30: 01:00:00 via INTRAVENOUS

## 2017-10-29 MED ORDER — ONDANSETRON HCL 4 MG PO TABS: 4.0000 mg | ORAL_TABLET | Freq: Four times a day (QID) | ORAL | Status: DC | PRN

## 2017-10-29 MED ORDER — SODIUM CHLORIDE 0.9 % IV SOLN
10.0000 mL/h | Freq: Once | INTRAVENOUS | Status: AC
  Administered 2017-10-29: 10 mL/h via INTRAVENOUS

## 2017-10-29 MED ORDER — HYDROCODONE-ACETAMINOPHEN 7.5-325 MG/15ML PO SOLN: 10.0000 mL | ORAL | Status: DC | PRN

## 2017-10-29 MED ORDER — FENTANYL 50 MCG/HR TD PT72: 50.0000 ug | MEDICATED_PATCH | TRANSDERMAL | Status: DC

## 2017-10-29 MED ORDER — LEVOTHYROXINE SODIUM 25 MCG PO TABS
125.0000 ug | ORAL_TABLET | Freq: Every day | ORAL | Status: DC
  Administered 2017-10-31 – 2017-11-02 (×3): 125 ug via ORAL
  Filled 2017-10-29 (×3): qty 1

## 2017-10-29 MED ORDER — PANTOPRAZOLE SODIUM 40 MG IV SOLR
40.0000 mg | Freq: Two times a day (BID) | INTRAVENOUS | Status: DC
  Administered 2017-10-29 – 2017-11-01 (×7): 40 mg via INTRAVENOUS
  Filled 2017-10-29 (×7): qty 40

## 2017-10-29 MED ORDER — METOPROLOL SUCCINATE ER 50 MG PO TB24: 50.0000 mg | ORAL_TABLET | Freq: Every day | ORAL | Status: DC

## 2017-10-29 MED ORDER — HYDROMORPHONE HCL 1 MG/ML IJ SOLN
INTRAMUSCULAR | Status: AC
  Administered 2017-10-29: 1 mg
  Filled 2017-10-29: qty 1

## 2017-10-29 MED ORDER — FERROUS SULFATE 325 (65 FE) MG PO TABS
325.0000 mg | ORAL_TABLET | Freq: Two times a day (BID) | ORAL | Status: DC
  Administered 2017-10-29 – 2017-11-01 (×7): 325 mg via ORAL
  Filled 2017-10-29 (×7): qty 1

## 2017-10-29 MED ORDER — ONDANSETRON HCL 4 MG/2ML IJ SOLN
4.0000 mg | Freq: Four times a day (QID) | INTRAMUSCULAR | Status: DC | PRN
  Administered 2017-10-29 – 2017-11-03 (×5): 4 mg via INTRAVENOUS
  Filled 2017-10-29 (×5): qty 2

## 2017-10-29 MED ORDER — HYDROMORPHONE HCL 1 MG/ML IJ SOLN: 1.0000 mg | INTRAMUSCULAR | Status: DC | PRN

## 2017-10-29 MED ORDER — NOREPINEPHRINE 4 MG/250ML-% IV SOLN: 0.0000 ug/min | INTRAVENOUS | Status: DC

## 2017-10-29 MED ORDER — LISINOPRIL 10 MG PO TABS: 20.0000 mg | ORAL_TABLET | Freq: Every day | ORAL | Status: DC

## 2017-10-29 MED ORDER — NOREPINEPHRINE 4 MG/250ML-% IV SOLN
0.0000 ug/min | INTRAVENOUS | Status: DC
  Administered 2017-10-29: 5 ug/min via INTRAVENOUS
  Administered 2017-10-30: 6 ug/min via INTRAVENOUS
  Administered 2017-10-30: 7 ug/min via INTRAVENOUS
  Filled 2017-10-29 (×5): qty 250

## 2017-10-29 MED ORDER — ACETAMINOPHEN 325 MG PO TABS
650.0000 mg | ORAL_TABLET | Freq: Four times a day (QID) | ORAL | Status: DC | PRN
  Filled 2017-10-29: qty 2

## 2017-10-29 MED ORDER — HYDROMORPHONE HCL 1 MG/ML IJ SOLN
1.0000 mg | INTRAMUSCULAR | Status: DC | PRN
  Administered 2017-10-29 – 2017-11-01 (×2): 1 mg via INTRAVENOUS
  Filled 2017-10-29 (×2): qty 1

## 2017-10-29 MED ORDER — SODIUM CHLORIDE 0.9 % IV SOLN
2.0000 g | INTRAVENOUS | Status: DC
  Administered 2017-10-30: 2 g via INTRAVENOUS
  Filled 2017-10-29 (×2): qty 20

## 2017-10-29 MED ORDER — SODIUM CHLORIDE 0.9 % IV BOLUS (SEPSIS): 500.0000 mL | Freq: Once | INTRAVENOUS | Status: DC

## 2017-10-29 MED ORDER — INSULIN ASPART 100 UNIT/ML ~~LOC~~ SOLN
0.0000 [IU] | Freq: Three times a day (TID) | SUBCUTANEOUS | Status: DC
  Administered 2017-10-30: 3 [IU] via SUBCUTANEOUS
  Administered 2017-10-30 (×2): 2 [IU] via SUBCUTANEOUS
  Administered 2017-10-31: 3 [IU] via SUBCUTANEOUS
  Filled 2017-10-29 (×4): qty 1

## 2017-10-29 MED ORDER — SODIUM CHLORIDE 0.9% FLUSH: 10.0000 mL | INTRAVENOUS | Status: DC | PRN

## 2017-10-29 NOTE — Progress Notes (Signed)
Pharmacy Antibiotic Note  Ann Larson is a 62 y.o. female admitted on 10/29/2017 with cellulitis.  Pharmacy has been consulted for ceftriaxone dosing.  Plan: Will start ceftriaxone 2g IV daily  Height: 5\' 9"  (175.3 cm) Weight: 231 lb 11.2 oz (105.1 kg) IBW/kg (Calculated) : 66.2  Temp (24hrs), Avg:98.1 F (36.7 C), Min:97.8 F (36.6 C), Max:98.4 F (36.9 C)  Recent Labs  Lab 10/24/17 1458 10/29/17 0945 10/29/17 2236  WBC 12.6* 7.6 4.6  CREATININE 1.27* 2.29*  --     Estimated Creatinine Clearance: 33.3 mL/min (A) (by C-G formula based on SCr of 2.29 mg/dL (H)).    Allergies  Allergen Reactions  . Sulfa Antibiotics     Thank you for allowing pharmacy to be a part of this patient's care.  Tobie Lords, PharmD, BCPS Clinical Pharmacist 10/29/2017

## 2017-10-29 NOTE — H&P (Addendum)
[place] [facility] [place] NAME: Ann Larson    MR#:  [id number]  DATE OF BIRTH:  1956/04/08  DATE OF ADMISSION:  10/29/2017  PRIMARY CARE PHYSICIAN: Lorelee Market, MD   REQUESTING/REFERRING PHYSICIAN:   CHIEF COMPLAINT:   Chief Complaint  Patient presents with  . Weakness    HISTORY OF PRESENT ILLNESS: Ann Larson  is a 62 y.o. female with a known history of diabetes mellitus type 2, GERD, hypercalcemia, hyperlipidemia, hypertension, recently diagnosed pancreatic cancer on chemotherapy presented to the emergency room with generalized weakness patient had oncology doctor appointment today but felt very weak.  She had a lot of fatigue and tiredness.  Hence came to the emergency room hemoglobin is around 6.4.  Case was discussed with oncology physician by ER physician.  Patient had chemotherapy last week for pancreatic cancer.  In the past patient was seen by gastroenterology and had hemorrhagic gastritis.  She has history of DVT in the left lower extremity but anticoagulation deferred in view of hemorrhagic gastritis.  1 unit PRBC IV transfusion was ordered in the emergency room.  Further workup as per gastroenterology.  No vomiting of blood, any rectal bleed has noticed by the patient in the last few days.  PAST MEDICAL HISTORY:   Past Medical History:  Diagnosis Date  . Depression   . Diabetes mellitus without complication ([place])   . Diverticulitis   . DVT (deep vein thrombosis) in pregnancy ([place]) 08/29/2017   Right popliteal and femoral  . GERD (gastroesophageal reflux disease)   . Hypercalcemia 10/22/2017  . Hyperlipidemia   . Hypertension   . Hypothyroidism   . Obesity   . Pancreatic cancer ([place]) 10/22/2017    PAST SURGICAL HISTORY:  Past Surgical History:  Procedure Laterality Date  . ABDOMINAL HYSTERECTOMY  2005  . CHOLECYSTECTOMY  2015  . COLONOSCOPY WITH PROPOFOL N/A 07/01/2015   Procedure: COLONOSCOPY WITH  PROPOFOL;  Surgeon: Josefine Class, MD;  Location: [facility] ENDOSCOPY;  Service: Endoscopy;  Laterality: N/A;  . ESOPHAGOGASTRODUODENOSCOPY (EGD) WITH PROPOFOL N/A 07/01/2015   Procedure: ESOPHAGOGASTRODUODENOSCOPY (EGD) WITH PROPOFOL;  Surgeon: Josefine Class, MD;  Location: [facility] ENDOSCOPY;  Service: Endoscopy;  Laterality: N/A;  . ESOPHAGOGASTRODUODENOSCOPY (EGD) WITH PROPOFOL N/A 09/08/2017   Procedure: ESOPHAGOGASTRODUODENOSCOPY (EGD) WITH PROPOFOL;  Surgeon: Lucilla Lame, MD;  Location: [facility] ENDOSCOPY;  Service: Endoscopy;  Laterality: N/A;  . ESOPHAGOGASTRODUODENOSCOPY (EGD) WITH PROPOFOL N/A 09/21/2017   Procedure: ESOPHAGOGASTRODUODENOSCOPY (EGD) WITH PROPOFOL;  Surgeon: Lucilla Lame, MD;  Location: [facility] ENDOSCOPY;  Service: Endoscopy;  Laterality: N/A;  . ESOPHAGOGASTRODUODENOSCOPY (EGD) WITH PROPOFOL N/A 10/18/2017   Procedure: ESOPHAGOGASTRODUODENOSCOPY (EGD) WITH PROPOFOL;  Surgeon: [name], MD;  Location: [facility] ENDOSCOPY;  Service: Gastroenterology;  Laterality: N/A;  . IVC FILTER INSERTION N/A 09/08/2017   Procedure: IVC FILTER INSERTION;  Surgeon: Algernon Huxley, MD;  Location: [facility] CV LAB;  Service: Cardiovascular;  Laterality: N/A;    SOCIAL HISTORY:  Social History   Tobacco Use  . Smoking status: Never Smoker  . Smokeless tobacco: Never Used  Substance Use Topics  . Alcohol use: No    FAMILY HISTORY:  Family History  Problem Relation Age of Onset  . Deep vein thrombosis Neg Hx     DRUG ALLERGIES:  Allergies  Allergen Reactions  . Sulfa Antibiotics     REVIEW OF SYSTEMS:   CONSTITUTIONAL: No fever, has fatigue and weakness.  EYES: No blurred or double vision.  EARS, NOSE, AND THROAT: No  tinnitus or ear pain.  RESPIRATORY: No cough, shortness of breath, wheezing or hemoptysis.  CARDIOVASCULAR: No chest pain, orthopnea, edema.  GASTROINTESTINAL: No nausea, vomiting, diarrhea or abdominal pain.  GENITOURINARY: No dysuria, hematuria.  ENDOCRINE: No  polyuria, nocturia,  HEMATOLOGY: No anemia, easy bruising or bleeding SKIN: No rash or lesion. MUSCULOSKELETAL: No joint pain or arthritis.   Left leg swelling NEUROLOGIC: No tingling, numbness, weakness.  PSYCHIATRY: No anxiety or depression.   MEDICATIONS AT HOME:  Prior to Admission medications   Medication Sig Start Date End Date Taking? Authorizing Provider  ferrous sulfate 325 (65 FE) MG EC tablet Take 1 tablet (325 mg total) by mouth 2 (two) times daily. 09/09/17 11/08/17 Yes Sudini, Alveta Heimlich, MD  levothyroxine (SYNTHROID, LEVOTHROID) 125 MCG tablet Take 125 mcg by mouth daily before breakfast.   Yes [provider]  lisinopril (PRINIVIL,ZESTRIL) 20 MG tablet Take 20 mg by mouth daily. 07/04/17  Yes [provider]  metoprolol succinate (TOPROL-XL) 100 MG 24 hr tablet Take 50 mg by mouth daily. Take with or immediately following a meal.    Yes [provider]  omeprazole (PRILOSEC) 40 MG capsule Take 1 capsule (40 mg total) by mouth 2 (two) times daily before a meal. 09/09/17  Yes Sudini, Alveta Heimlich, MD  oxyCODONE (OXY IR/ROXICODONE) 5 MG immediate release tablet Take 1 tablet (5 mg total) by mouth every 4 (four) hours as needed for moderate pain. 10/22/17  Yes Epifanio Lesches, MD  oxyCODONE (OXYCONTIN) 15 mg 12 hr tablet Take 1 tablet (15 mg total) by mouth every 12 (twelve) hours. 10/26/17  Yes Earlie Server, MD  pioglitazone (ACTOS) 45 MG tablet Take 45 mg by mouth daily. 06/18/17  Yes [provider]  protein supplement shake (PREMIER PROTEIN) LIQD Take 325 mLs (11 oz total) by mouth daily. 10/22/17  Yes Epifanio Lesches, MD  senna (SENOKOT) 8.6 MG TABS tablet Take 2 tablets (17.2 mg total) by mouth daily. Hold if loose stools. 10/24/17  Yes Earlie Server, MD  sucralfate (CARAFATE) 1 g tablet Take 1 tablet (1 g total) by mouth 4 (four) times daily. 10/09/17  Yes Wohl, Darren, MD  TOUJEO SOLOSTAR 300 UNIT/ML SOPN Inject 15 Units into the skin daily. 10/22/17  Yes  Epifanio Lesches, MD  venlafaxine XR (EFFEXOR-XR) 75 MG 24 hr capsule Take 75 mg by mouth daily with breakfast.   Yes [provider]  HYDROmorphone (DILAUDID) 2 MG tablet Take 1 tablet (2 mg total) by mouth every 4 (four) hours. Patient not taking: Reported on 10/29/2017 10/22/17   Epifanio Lesches, MD  ondansetron (ZOFRAN) 8 MG tablet Take 1 tablet (8 mg total) by mouth 2 (two) times daily as needed (Nausea or vomiting). Patient not taking: Reported on 10/25/2017 10/22/17   Earlie Server, MD  prochlorperazine (COMPAZINE) 10 MG tablet Take 1 tablet (10 mg total) by mouth every 6 (six) hours as needed (Nausea or vomiting). Patient not taking: Reported on 10/25/2017 10/22/17   Earlie Server, MD      PHYSICAL EXAMINATION:   VITAL SIGNS: Blood pressure (!) 93/49, pulse 94, temperature 98.4 F (36.9 C), temperature source Oral, resp. rate 20, height 5\' 9"  (1.753 m), weight 103.4 kg (228 lb), SpO2 98 %.  GENERAL:  62 y.o.-year-old patient lying in the bed with no acute distress.  EYES: Pupils equal, round, reactive to light and accommodation. No scleral icterus. Extraocular muscles intact. Pallor present. HEENT: Head atraumatic, normocephalic. Oropharynx and nasopharynx clear.  NECK:  Supple, no jugular venous distention. No  thyroid enlargement, no tenderness.  LUNGS: Normal breath sounds bilaterally, no wheezing, rales,rhonchi or crepitation. No use of accessory muscles of respiration.  CARDIOVASCULAR: S1, S2 normal. No murmurs, rubs, or gallops.  ABDOMEN: Soft, nontender, nondistended. Bowel sounds present. No organomegaly or mass.  EXTREMITIES: No cyanosis, or clubbing.  Left leg redness, swelling present NEUROLOGIC: Cranial nerves II through XII are intact. Muscle strength 5/5 in all extremities. Sensation intact. Gait not checked.  PSYCHIATRIC: The patient is alert and oriented x 3.  SKIN: No obvious rash, lesion, or ulcer.   LABORATORY PANEL:   CBC Recent Labs  Lab 10/24/17 1458  10/29/17 0945  WBC 12.6* 7.6  HGB 8.0* 6.4*  HCT 25.8* 20.2*  PLT 374 196  MCV 90.5 88.5  MCH 28.0 27.9  MCHC 31.0* 31.5*  RDW 17.2* 18.5*  LYMPHSABS 2.0  --   MONOABS 1.0*  --   EOSABS 0.3  --   BASOSABS 0.1  --    ------------------------------------------------------------------------------------------------------------------  Chemistries  Recent Labs  Lab 10/24/17 1458 10/29/17 0945  NA 138 135  K 4.8 4.3  CL 106 104  CO2 24 18*  GLUCOSE 164* 188*  BUN 50* 44*  CREATININE 1.27* 2.29*  CALCIUM 11.7* 9.0  AST 24  --   ALT 40  --   ALKPHOS 131*  --   BILITOT 0.6  --    ------------------------------------------------------------------------------------------------------------------ estimated creatinine clearance is 33 mL/min (A) (by C-G formula based on SCr of 2.29 mg/dL (H)). ------------------------------------------------------------------------------------------------------------------ No results for input(s): TSH, T4TOTAL, T3FREE, THYROIDAB in the last 72 hours.  Invalid input(s): FREET3   Coagulation profile No results for input(s): INR, PROTIME in the last 168 hours. ------------------------------------------------------------------------------------------------------------------- No results for input(s): DDIMER in the last 72 hours. -------------------------------------------------------------------------------------------------------------------  Cardiac Enzymes Recent Labs  Lab 10/29/17 0945  TROPONINI <0.03   ------------------------------------------------------------------------------------------------------------------ Invalid input(s): POCBNP  ---------------------------------------------------------------------------------------------------------------  Urinalysis    Component Value Date/Time   COLORURINE YELLOW (A) 10/16/2017 0750   APPEARANCEUR HAZY (A) 10/16/2017 0750   LABSPEC >1.046 (H) 10/16/2017 0750   PHURINE 5.0 10/16/2017  0750   GLUCOSEU NEGATIVE 10/16/2017 0750   HGBUR NEGATIVE 10/16/2017 0750   BILIRUBINUR NEGATIVE 10/16/2017 0750   KETONESUR NEGATIVE 10/16/2017 0750   PROTEINUR 30 (A) 10/16/2017 0750   NITRITE NEGATIVE 10/16/2017 0750   LEUKOCYTESUR NEGATIVE 10/16/2017 0750     RADIOLOGY: No results found.  EKG: Orders placed or performed during the hospital encounter of 10/29/17  . ED EKG  . ED EKG    IMPRESSION AND PLAN:  62 year old female patient with history of recently diagnosed pancreatic cancer, hypertension, hyperlipidemia, hypothyroidism, DVT, anemia presented to the emergency room with generalized weakness and fatigue.   1.  Symptomatic anemia Transfuse PRBC IV 1 unit Follow-up hemoglobin hematocrit closely  2.  Gastrointestinal bleeding  Protonix 40 mg IV every 4 hourly Gastroenterology consultation  3.Pancreatic cancer Oncology follow-up as outpatient  4.  Hypertension Resume metoprolol for control of blood pressure      All the records are reviewed and case discussed with ED provider. Management plans discussed with the patient, family and they are in agreement.  CODE STATUS:FULL CODE Code Status History    Date Active Date Inactive Code Status Order ID Comments User Context   10/16/2017 00:26 10/22/2017 20:38 Full Code [id number]  Lance Coon, MD ED   09/07/2017 16:09 09/09/2017 16:37 Full Code [id number]  Hillary Bow, MD ED       TOTAL TIME TAKING CARE OF THIS PATIENT: 51 minutes.  Saundra Shelling M.D on 10/29/2017 at 11:25 AM  Between 7am to 6pm - Pager - [phone]  After 6pm go to [email or web address] - password [email or web address] [facility]  [facility]  Office  [phone]  CC: Primary care physician; Lorelee Market, MD

## 2017-10-29 NOTE — ED Notes (Signed)
Spoke with Hospitalist about pts blood pressure. We are going to wait for blood to transfuse and see if her B/P is better. Pt is asymptomatic.

## 2017-10-29 NOTE — ED Notes (Signed)
Pts got Dilaudid and B/P is 71/35. Called Hospitalist, new orders given.

## 2017-10-29 NOTE — Telephone Encounter (Signed)
Daughter Morey Hummingbird called to cancel appointment this morning as they called EMS for patient who is shortness of breath and having blurred vbision

## 2017-10-29 NOTE — Progress Notes (Signed)
MD Pyreddy was notified of bp 83/49. Order was to check bp in a hour and if SBP less than 90 another 500 cc bolus. No further orders at this time.

## 2017-10-29 NOTE — ED Triage Notes (Signed)
Arrives from EMS with C/O weakness, dizziness, SOB.  Symptoms started this morning.  Patient has Pancreatic Cancer and currently being treated with Chemo.  Has had one treatment done last week.  Was scheduled for an appointment at the Cancer center today for labs and possible iron or blood transfusion.

## 2017-10-29 NOTE — Progress Notes (Signed)
MD Pyreddy was questioned about diet order-place clear liquid. Add SSI NOvolog mild scale. BP was 85/43 ok however goal bp is sbp 90. D/c intenvisit and levophed order. No further orders at this time.

## 2017-10-29 NOTE — ED Notes (Signed)
Pt taken to floor in stretcher. B/P is soft but this is her baseline. Family at bedside. All questions answered. Report called to [facility].

## 2017-10-29 NOTE — Consult Note (Signed)
PULMONARY / CRITICAL CARE MEDICINE   Name: Ann Larson MRN: [id number] DOB: Jan 17, 1956    ADMISSION DATE:  10/29/2017   CONSULTATION DATE:  10/29/2017  REFERRING MD: Dr. Estanislado Pandy  Reason: Hypotension  HISTORY OF PRESENT ILLNESS:   This is a 62 year old African-American female with a recently diagnosis of metastatic metastatic pancreatic cancer with metastases to the lungs and liver who presented to the ED near syncope.  She was found to be anemic and dehydrated.  She was given 1 unit of packed red blood cells and admitted to the floor.  Upon arrival in the unit floor, she became persistently hypotensive.  She was given a 500 cc bolus without any improvement in her blood pressure hence she was transferred to the ICU for further management. Patient is complaining of severe left leg pain.  She has a massive DVT in the left leg.  She has also had a DVT in the right leg that has improved.  She was placed on blood thinners but had a GI bleed hence anticoagulation was discontinued.  She had an IVC filter placed.  She is also had multiple GI procedures for GI bleed. She reports significant worsening of left leg pain and swelling.  She rates the pain in her left leg as a 10 on 10 worse with minimal movement.  She also reports profound anorexia and difficulty swallowing.  She is on not been able to swallow pills.  Her last chemo session was last week.  She has a right PICC line  PAST MEDICAL HISTORY :  She  has a past medical history of Depression, Diabetes mellitus without complication ([place]), Diverticulitis, DVT (deep vein thrombosis) in pregnancy ([place]) (08/29/2017), GERD (gastroesophageal reflux disease), Hypercalcemia (10/22/2017), Hyperlipidemia, Hypertension, Hypothyroidism, Obesity, and Pancreatic cancer ([place]) (10/22/2017).  PAST SURGICAL HISTORY: She  has a past surgical history that includes Colonoscopy with propofol (N/A, 07/01/2015); Esophagogastroduodenoscopy (egd) with propofol (N/A,  07/01/2015); Abdominal hysterectomy (2005); Cholecystectomy (2015); Esophagogastroduodenoscopy (egd) with propofol (N/A, 09/08/2017); IVC FILTER INSERTION (N/A, 09/08/2017); Esophagogastroduodenoscopy (egd) with propofol (N/A, 09/21/2017); and Esophagogastroduodenoscopy (egd) with propofol (N/A, 10/18/2017).  Allergies  Allergen Reactions  . Sulfa Antibiotics     No current facility-administered medications on file prior to encounter.    Current Outpatient Medications on File Prior to Encounter  Medication Sig  . ferrous sulfate 325 (65 FE) MG EC tablet Take 1 tablet (325 mg total) by mouth 2 (two) times daily.  Marland Kitchen levothyroxine (SYNTHROID, LEVOTHROID) 125 MCG tablet Take 125 mcg by mouth daily before breakfast.  . lisinopril (PRINIVIL,ZESTRIL) 20 MG tablet Take 20 mg by mouth daily.  . metoprolol succinate (TOPROL-XL) 100 MG 24 hr tablet Take 50 mg by mouth daily. Take with or immediately following a meal.   . omeprazole (PRILOSEC) 40 MG capsule Take 1 capsule (40 mg total) by mouth 2 (two) times daily before a meal.  . oxyCODONE (OXY IR/ROXICODONE) 5 MG immediate release tablet Take 1 tablet (5 mg total) by mouth every 4 (four) hours as needed for moderate pain.  Marland Kitchen oxyCODONE (OXYCONTIN) 15 mg 12 hr tablet Take 1 tablet (15 mg total) by mouth every 12 (twelve) hours.  . pioglitazone (ACTOS) 45 MG tablet Take 45 mg by mouth daily.  . protein supplement shake (PREMIER PROTEIN) LIQD Take 325 mLs (11 oz total) by mouth daily.  Marland Kitchen senna (SENOKOT) 8.6 MG TABS tablet Take 2 tablets (17.2 mg total) by mouth daily. Hold if loose stools.  . sucralfate (CARAFATE) 1 g tablet Take 1  tablet (1 g total) by mouth 4 (four) times daily.  Nelva Nay SOLOSTAR 300 UNIT/ML SOPN Inject 15 Units into the skin daily.  Marland Kitchen venlafaxine XR (EFFEXOR-XR) 75 MG 24 hr capsule Take 75 mg by mouth daily with breakfast.  . HYDROmorphone (DILAUDID) 2 MG tablet Take 1 tablet (2 mg total) by mouth every 4 (four) hours. (Patient not taking:  Reported on 10/29/2017)  . ondansetron (ZOFRAN) 8 MG tablet Take 1 tablet (8 mg total) by mouth 2 (two) times daily as needed (Nausea or vomiting). (Patient not taking: Reported on 10/25/2017)  . prochlorperazine (COMPAZINE) 10 MG tablet Take 1 tablet (10 mg total) by mouth every 6 (six) hours as needed (Nausea or vomiting). (Patient not taking: Reported on 10/25/2017)    FAMILY HISTORY:  Her indicated that the status of her neg hx is unknown.   SOCIAL HISTORY: She  reports that  has never smoked. she has never used smokeless tobacco. She reports that she does not drink alcohol or use drugs.  REVIEW OF SYSTEMS:   Constitutional: Negative for fever and chills but positive for generalized malaise.  HENT: Negative for congestion and rhinorrhea.  Eyes: Negative for redness and visual disturbance.  Respiratory: Negative for shortness of breath and wheezing.  Cardiovascular: Negative for chest pain and palpitations.  Gastrointestinal: Negative  for nausea , vomiting and abdominal pain and  Loose stools but positive for poor appetite Genitourinary: Negative for dysuria and urgency.  Endocrine: Denies polyuria, polyphagia and heat intolerance Musculoskeletal: Positive for right leg pain, redness and swelling Skin: Negative for pallor and wound but positive for cellulitis of the left leg.  Neurological: Negative for dizziness and headaches   SUBJECTIVE:   VITAL SIGNS: BP (!) 89/45 (BP Location: Left Arm)   Pulse 95   Temp 97.8 F (36.6 C) (Oral)   Resp 19   Ht 5\' 9"  (1.753 m)   Wt 231 lb 11.2 oz (105.1 kg)   SpO2 100%   BMI 34.22 kg/m   HEMODYNAMICS:    VENTILATOR SETTINGS:    INTAKE / OUTPUT: I/O last 3 completed shifts: In: 641.7 [Blood:641.7] Out: -   PHYSICAL EXAMINATION: General: Acutely ill looking  neuro: Alert and oriented x4, cranial nerves intact  HEENT: PERRLA, oral mucosa dry Cardiovascular:  RRR, S1-S2, no murmur regurg, +2 pulses bilaterally, +3 edema in left  lower extremity Lungs: Clear to auscultation bilaterally Abdomen: Obese, normal bowel sounds Musculoskeletal: Limited range of motion in left lower extremity significant swelling in entire extremity Skin: Left lower extremity warm to touch with profound erythema  LABS:  BMET Recent Labs  Lab 10/24/17 1458 10/29/17 0945  NA 138 135  K 4.8 4.3  CL 106 104  CO2 24 18*  BUN 50* 44*  CREATININE 1.27* 2.29*  GLUCOSE 164* 188*    Electrolytes Recent Labs  Lab 10/24/17 1458 10/29/17 0945  CALCIUM 11.7* 9.0    CBC Recent Labs  Lab 10/24/17 1458 10/29/17 0945 10/29/17 1758  WBC 12.6* 7.6  --   HGB 8.0* 6.4* 8.1*  HCT 25.8* 20.2* 25.6*  PLT 374 196  --     Coag's No results for input(s): APTT, INR in the last 168 hours.  Sepsis Markers No results for input(s): LATICACIDVEN, PROCALCITON, O2SATVEN in the last 168 hours.  ABG No results for input(s): PHART, PCO2ART, PO2ART in the last 168 hours.  Liver Enzymes Recent Labs  Lab 10/24/17 1458  AST 24  ALT 40  ALKPHOS 131*  BILITOT 0.6  ALBUMIN 2.8*    Cardiac Enzymes Recent Labs  Lab 10/29/17 0945  TROPONINI <0.03    Glucose Recent Labs  Lab 10/29/17 1818 10/29/17 2053  GLUCAP 125* 143*    Imaging Dg Chest Port 1 View  Result Date: 10/29/2017 CLINICAL DATA:  Syncopal episode EXAM: PORTABLE CHEST 1 VIEW COMPARISON:  10/15/2017 FINDINGS: Right-sided PICC line is noted with the catheter tip in the superior aspect of the right atrium. Lungs are well aerated bilaterally without focal infiltrate. Some nodular appearing densities are noted in the bases bilaterally similar to that seen on prior CT examination. No other focal abnormality is noted. IMPRESSION: Stable appearing metastatic disease within the lungs. PICC line as described. Electronically Signed   By: Inez Catalina M.D.   On: 10/29/2017 21:29   ANTIBIOTICS: Ceftriaxone for lower extremity cellulitis  SIGNIFICANT EVENTS: 3/11>  admitted  LINES/TUBES: Right PICC line Peripheral IVs  DISCUSSION: For 62 year old African-American female with metastatic pancreatic cancer presenting with dehydration secondary to poor oral intake, syncope secondary to volume depletion and severe left leg pain from profound DVTs  ASSESSMENT  Hypovolemic shock Acute blood loss anemia secondary to GI bleed Severe DVT of the left lower extremity with possible compartment syndrome Severe pain secondary to DVT Metastatic pancreatic cancer  PLAN IV fluids and pressors to maintain mean arterial blood pressure greater than 65 Pain management with fentanyl patch, as needed Dilaudid and oral hydrocodone. Oncology is following patient Palliative care consult for goals of care Spoke with Dr. Delana Meyer from vascular surgery and he recommended raising the patient's leg and controlling her pain Continue Protonix IV twice a day No pharmacologic DVT prophylaxis  SCDs  FAMILY  - Updates: Raquel Sarna at bedside will update when available  - Inter-disciplinary family meet or Palliative Care meeting due by:  day [place]. [facility] ANP-BC Pulmonary and Critical Care Medicine [facility] Pager [phone] or [phone]  NB: This document was prepared using Dragon voice recognition software and may include unintentional dictation errors.    10/29/2017, 10:45 PM

## 2017-10-29 NOTE — Progress Notes (Signed)
Pt transferred to CCU 1. Pt alert and oriented with no concerns offered.

## 2017-10-29 NOTE — ED Provider Notes (Signed)
[facility] Emergency Department Provider Note   ____________________________________________    I have reviewed the triage vital signs and the nursing notes.   HISTORY  Chief Complaint Weakness     HPI Ann Larson is a 62 y.o. female with a history of diabetes, pancreatic cancer, metastatic, had chemotherapy last week was due to follow-up at the cancer center today but felt very dizzy and almost syncopized this morning.  Denies vomiting, no dark stools or diarrhea.  Has a history of anemia.  Review of records demonstrates a history of hemorrhagic gastritis treated in the past.  Baseline hemoglobin is 8.  No fevers reported.  Dr. Tasia Catchings is her oncologist.  No chest pain or palpitations.   Past Medical History:  Diagnosis Date  . Depression   . Diabetes mellitus without complication ([place])   . Diverticulitis   . DVT (deep vein thrombosis) in pregnancy ([place]) 08/29/2017   Right popliteal and femoral  . GERD (gastroesophageal reflux disease)   . Hypercalcemia 10/22/2017  . Hyperlipidemia   . Hypertension   . Hypothyroidism   . Obesity   . Pancreatic cancer ([place]) 10/22/2017    Patient Active Problem List   Diagnosis Date Noted  . Goals of care, counseling/discussion 10/23/2017  . Malignant neoplasm of pancreas ([place]) 10/22/2017  . Hypercalcemia 10/22/2017  . Multiple lesions of metastatic malignancy ([place]) 10/16/2017  . Generalized abdominal pain   . Omental mass   . Pancreatic mass   . Hypothyroidism 10/15/2017  . Diabetes ([place]) 10/15/2017  . Hypertension 10/05/2017  . Deep vein thrombosis (DVT) of right lower extremity ([place]) 09/26/2017  . Personal history of peptic ulcer disease   . Gastritis with hemorrhage   . Symptomatic anemia   . Heme + stool   . Acute GI bleeding   . GI bleed 09/07/2017    Past Surgical History:  Procedure Laterality Date  . ABDOMINAL HYSTERECTOMY  2005  . CHOLECYSTECTOMY  2015  . COLONOSCOPY WITH PROPOFOL  N/A 07/01/2015   Procedure: COLONOSCOPY WITH PROPOFOL;  Surgeon: Josefine Class, MD;  Location: [facility] ENDOSCOPY;  Service: Endoscopy;  Laterality: N/A;  . ESOPHAGOGASTRODUODENOSCOPY (EGD) WITH PROPOFOL N/A 07/01/2015   Procedure: ESOPHAGOGASTRODUODENOSCOPY (EGD) WITH PROPOFOL;  Surgeon: Josefine Class, MD;  Location: [facility] ENDOSCOPY;  Service: Endoscopy;  Laterality: N/A;  . ESOPHAGOGASTRODUODENOSCOPY (EGD) WITH PROPOFOL N/A 09/08/2017   Procedure: ESOPHAGOGASTRODUODENOSCOPY (EGD) WITH PROPOFOL;  Surgeon: Lucilla Lame, MD;  Location: [facility] ENDOSCOPY;  Service: Endoscopy;  Laterality: N/A;  . ESOPHAGOGASTRODUODENOSCOPY (EGD) WITH PROPOFOL N/A 09/21/2017   Procedure: ESOPHAGOGASTRODUODENOSCOPY (EGD) WITH PROPOFOL;  Surgeon: Lucilla Lame, MD;  Location: [facility] ENDOSCOPY;  Service: Endoscopy;  Laterality: N/A;  . ESOPHAGOGASTRODUODENOSCOPY (EGD) WITH PROPOFOL N/A 10/18/2017   Procedure: ESOPHAGOGASTRODUODENOSCOPY (EGD) WITH PROPOFOL;  Surgeon: [name], MD;  Location: [facility] ENDOSCOPY;  Service: Gastroenterology;  Laterality: N/A;  . IVC FILTER INSERTION N/A 09/08/2017   Procedure: IVC FILTER INSERTION;  Surgeon: Algernon Huxley, MD;  Location: [place] CV LAB;  Service: Cardiovascular;  Laterality: N/A;    Prior to Admission medications   Medication Sig Start Date End Date Taking? Authorizing Provider  ferrous sulfate 325 (65 FE) MG EC tablet Take 1 tablet (325 mg total) by mouth 2 (two) times daily. 09/09/17 11/08/17 Yes Sudini, Alveta Heimlich, MD  levothyroxine (SYNTHROID, LEVOTHROID) 125 MCG tablet Take 125 mcg by mouth daily before breakfast.   Yes [provider]  lisinopril (PRINIVIL,ZESTRIL) 20 MG tablet Take 20 mg by mouth daily. 07/04/17  Yes [provider]  metoprolol succinate (TOPROL-XL) 100 MG 24 hr tablet Take 50 mg by mouth daily. Take with or immediately following a meal.    Yes [provider]  omeprazole (PRILOSEC) 40 MG capsule Take 1 capsule (40 mg total) by  mouth 2 (two) times daily before a meal. 09/09/17  Yes Sudini, Alveta Heimlich, MD  oxyCODONE (OXY IR/ROXICODONE) 5 MG immediate release tablet Take 1 tablet (5 mg total) by mouth every 4 (four) hours as needed for moderate pain. 10/22/17  Yes Epifanio Lesches, MD  oxyCODONE (OXYCONTIN) 15 mg 12 hr tablet Take 1 tablet (15 mg total) by mouth every 12 (twelve) hours. 10/26/17  Yes Earlie Server, MD  pioglitazone (ACTOS) 45 MG tablet Take 45 mg by mouth daily. 06/18/17  Yes [provider]  protein supplement shake (PREMIER PROTEIN) LIQD Take 325 mLs (11 oz total) by mouth daily. 10/22/17  Yes Epifanio Lesches, MD  senna (SENOKOT) 8.6 MG TABS tablet Take 2 tablets (17.2 mg total) by mouth daily. Hold if loose stools. 10/24/17  Yes Earlie Server, MD  sucralfate (CARAFATE) 1 g tablet Take 1 tablet (1 g total) by mouth 4 (four) times daily. 10/09/17  Yes Wohl, Darren, MD  TOUJEO SOLOSTAR 300 UNIT/ML SOPN Inject 15 Units into the skin daily. 10/22/17  Yes Epifanio Lesches, MD  venlafaxine XR (EFFEXOR-XR) 75 MG 24 hr capsule Take 75 mg by mouth daily with breakfast.   Yes [provider]  HYDROmorphone (DILAUDID) 2 MG tablet Take 1 tablet (2 mg total) by mouth every 4 (four) hours. Patient not taking: Reported on 10/29/2017 10/22/17   Epifanio Lesches, MD  ondansetron (ZOFRAN) 8 MG tablet Take 1 tablet (8 mg total) by mouth 2 (two) times daily as needed (Nausea or vomiting). Patient not taking: Reported on 10/25/2017 10/22/17   Earlie Server, MD  prochlorperazine (COMPAZINE) 10 MG tablet Take 1 tablet (10 mg total) by mouth every 6 (six) hours as needed (Nausea or vomiting). Patient not taking: Reported on 10/25/2017 10/22/17   Earlie Server, MD     Allergies Sulfa antibiotics  Family History  Problem Relation Age of Onset  . Deep vein thrombosis Neg Hx     Social History Social History   Tobacco Use  . Smoking status: Never Smoker  . Smokeless tobacco: Never Used  Substance Use Topics  . Alcohol use: No  .  Drug use: No    Review of Systems  Constitutional: No fevers Eyes: No visual changes.  ENT: No neck pain Cardiovascular: No chest pain Respiratory: No cough or shortness of breath Gastrointestinal: No abdominal pain.  No nausea, no vomiting.  Normal stools Genitourinary: Negative for dysuria. Musculoskeletal: Negative for back pain. Skin: Negative for rash. Neurological: Negative for headaches    ____________________________________________   PHYSICAL EXAM:  VITAL SIGNS: ED Triage Vitals  Enc Vitals Group     BP 10/29/17 0917 (!) 93/49     Pulse Rate 10/29/17 0917 94     Resp 10/29/17 0916 20     Temp 10/29/17 0916 98.4 F (36.9 C)     Temp Source 10/29/17 0916 Oral     SpO2 10/29/17 0917 98 %     Weight 10/29/17 0914 103.4 kg (228 lb)     Height 10/29/17 0914 1.753 m (5\' 9" )     Head Circumference --      Peak Flow --      Pain Score 10/29/17 0913 10     Pain Loc --  Pain Edu? --      Excl. in [place]? --     Constitutional: Alert and oriented. Pleasant and interactive.  Pale appearing Eyes: Conjunctivae are normal.   Nose: No congestion/rhinnorhea. Mouth/Throat: Mucous membranes are dry Neck:  Painless ROM Cardiovascular: Normal rate, regular rhythm. Grossly normal heart sounds.  Good peripheral circulation. Respiratory: Normal respiratory effort.  No retractions. Lungs CTAB. Gastrointestinal: Soft and nontender. No distention.   Genitourinary: deferred Musculoskeletal: No lower extremity tenderness nor edema.  Warm and well perfused.  PICC line right arm Neurologic:  Normal speech and language. No gross focal neurologic deficits are appreciated.  Skin:  Skin is warm, dry and intact. No rash noted. Psychiatric: Mood and affect are normal. Speech and behavior are normal.  ____________________________________________   LABS (all labs ordered are listed, but only abnormal results are displayed)  Labs Reviewed  BASIC METABOLIC PANEL - Abnormal; Notable for  the following components:      Result Value   CO2 18 (*)    Glucose, Bld 188 (*)    BUN 44 (*)    Creatinine, Ser 2.29 (*)    GFR calc non Af Amer 22 (*)    GFR calc Af Amer 25 (*)    All other components within normal limits  CBC - Abnormal; Notable for the following components:   RBC 2.29 (*)    Hemoglobin 6.4 (*)    HCT 20.2 (*)    MCHC 31.5 (*)    RDW 18.5 (*)    All other components within normal limits  TROPONIN I  URINALYSIS, COMPLETE (UACMP) WITH MICROSCOPIC  HEMOGLOBIN AND HEMATOCRIT, BLOOD  HEMOGLOBIN AND HEMATOCRIT, BLOOD  CBG MONITORING, ED  PREPARE RBC (CROSSMATCH)  TYPE AND SCREEN   ____________________________________________  EKG  ED ECG REPORT I, Lavonia Drafts, the attending physician, personally viewed and interpreted this ECG.  Date: 10/29/2017  Rate: 102 Rhythm: Sinus tachycardia QRS Axis: normal Intervals: normal ST/T Wave abnormalities: normal Narrative Interpretation: no evidence of acute ischemia  ____________________________________________  RADIOLOGY  None ____________________________________________   PROCEDURES  Procedure(s) performed: No  Procedures   Critical Care performed: No ____________________________________________   INITIAL IMPRESSION / ASSESSMENT AND PLAN / ED COURSE  Pertinent labs & imaging results that were available during my care of the patient were reviewed by me and considered in my medical decision making (see chart for details).  Patient presents with a history of pancreatic cancer, chemotherapy as above.  Clinically appears dehydrated but also pale.  Lab work confirms anemia, hemoglobin 6.4, also BUN of 44 and creatinine of 2.29 consistent with dehydration.  IV fluids packed red blood cells ordered.  Discussed with Dr. Tasia Catchings of oncology who agrees with the plan    ____________________________________________   FINAL CLINICAL IMPRESSION(S) / ED DIAGNOSES  Final diagnoses:  Dehydration  Dizziness    Symptomatic anemia        Note:  This document was prepared using Dragon voice recognition software and may include unintentional dictation errors.    Lavonia Drafts, MD 10/29/17 1157

## 2017-10-30 DIAGNOSIS — D649 Anemia, unspecified: Secondary | ICD-10-CM

## 2017-10-30 DIAGNOSIS — Z7189 Other specified counseling: Secondary | ICD-10-CM

## 2017-10-30 DIAGNOSIS — R571 Hypovolemic shock: Secondary | ICD-10-CM

## 2017-10-30 DIAGNOSIS — E860 Dehydration: Secondary | ICD-10-CM

## 2017-10-30 DIAGNOSIS — K922 Gastrointestinal hemorrhage, unspecified: Secondary | ICD-10-CM

## 2017-10-30 DIAGNOSIS — Z515 Encounter for palliative care: Secondary | ICD-10-CM

## 2017-10-30 DIAGNOSIS — R42 Dizziness and giddiness: Secondary | ICD-10-CM

## 2017-10-30 LAB — BASIC METABOLIC PANEL
ANION GAP: 11 (ref 5–15)
BUN: 50 mg/dL — ABNORMAL HIGH (ref 6–20)
CO2: 16 mmol/L — AB (ref 22–32)
Calcium: 7.9 mg/dL — ABNORMAL LOW (ref 8.9–10.3)
Chloride: 110 mmol/L (ref 101–111)
Creatinine, Ser: 2.18 mg/dL — ABNORMAL HIGH (ref 0.44–1.00)
GFR calc non Af Amer: 23 mL/min — ABNORMAL LOW (ref >60)
GFR, EST AFRICAN AMERICAN: 27 mL/min — AB (ref >60)
GLUCOSE: 219 mg/dL — AB (ref 65–99)
Potassium: 4.2 mmol/L (ref 3.5–5.1)
Sodium: 137 mmol/L (ref 135–145)

## 2017-10-30 LAB — HEMOGLOBIN AND HEMATOCRIT, BLOOD
HEMATOCRIT: 27.6 % — AB (ref 35.0–47.0)
HEMATOCRIT: 27.4 % — AB (ref 35.0–47.0)
HEMOGLOBIN: 8.9 g/dL — AB (ref 12.0–16.0)
Hemoglobin: 9.2 g/dL — ABNORMAL LOW (ref 12.0–16.0)

## 2017-10-30 LAB — GLUCOSE, CAPILLARY
GLUCOSE-CAPILLARY: 227 mg/dL — AB (ref 65–99)
GLUCOSE-CAPILLARY: 226 mg/dL — AB (ref 65–99)
Glucose-Capillary: 180 mg/dL — ABNORMAL HIGH (ref 65–99)
Glucose-Capillary: 197 mg/dL — ABNORMAL HIGH (ref 65–99)

## 2017-10-30 LAB — TROPONIN I

## 2017-10-30 MED ORDER — SODIUM CHLORIDE 0.9 % IV SOLN
1.0000 g | INTRAVENOUS | Status: DC
  Administered 2017-10-30 – 2017-11-01 (×3): 1 g via INTRAVENOUS
  Filled 2017-10-30 (×7): qty 10

## 2017-10-30 MED ORDER — SODIUM CHLORIDE 0.9 % IV BOLUS (SEPSIS)
1000.0000 mL | INTRAVENOUS | Status: AC
  Administered 2017-10-30 (×2): 1000 mL via INTRAVENOUS

## 2017-10-30 MED ORDER — SODIUM CHLORIDE 0.9 % IV SOLN
2.0000 g | INTRAVENOUS | Status: DC
  Filled 2017-10-30: qty 20

## 2017-10-30 NOTE — Progress Notes (Addendum)
[place] [facility] [place] NAME: Taffany Heiser    MR#:  [id number]  DATE OF BIRTH:  March 13, 1956  SUBJECTIVE:  CHIEF COMPLAINT:   Chief Complaint  Patient presents with  . Weakness   - came in with weakness and noted to have anemia with hemoglobin of 7.1, status post transfusion and hemoglobin at 9.2 - known right DVT and swelling of right leg. Now has significant left leg swelling  REVIEW OF SYSTEMS:  Review of Systems  Constitutional: Positive for malaise/fatigue. Negative for chills and fever.  HENT: Negative for ear discharge, hearing loss and nosebleeds.   Eyes: Negative for blurred vision and double vision.  Respiratory: Negative for cough, shortness of breath and wheezing.   Cardiovascular: Positive for leg swelling. Negative for chest pain and palpitations.  Gastrointestinal: Negative for abdominal pain, constipation, diarrhea, nausea and vomiting.  Genitourinary: Negative for dysuria.  Musculoskeletal: Positive for myalgias.  Neurological: Negative for dizziness, speech change, focal weakness, seizures and headaches.  Psychiatric/Behavioral: Negative for depression.    DRUG ALLERGIES:   Allergies  Allergen Reactions  . Sulfa Antibiotics     VITALS:  Blood pressure (!) 111/97, pulse 89, temperature 97.9 F (36.6 C), temperature source Axillary, resp. rate 18, height 5\' 9"  (1.753 m), weight 105.1 kg (231 lb 11.2 oz), SpO2 100 %.  PHYSICAL EXAMINATION:  Physical Exam  GENERAL:  62 y.o.-year-old patient lying in the bed with no acute distress.  EYES: Pupils equal, round, reactive to light and accommodation. No scleral icterus. Extraocular muscles intact.  HEENT: Head atraumatic, normocephalic. Oropharynx and nasopharynx clear.  NECK:  Supple, no jugular venous distention. No thyroid enlargement, no tenderness.  LUNGS: Normal breath sounds bilaterally, no wheezing, rales,rhonchi or crepitation. No use of accessory muscles of  respiration. Decreased bibasilar breath sounds CARDIOVASCULAR: S1, S2 normal. No murmurs, rubs, or gallops.  ABDOMEN: Soft, nontender, nondistended. Bowel sounds present. No organomegaly or mass.  EXTREMITIES: No  cyanosis, or clubbing. 2+ right leg edema and 3+ left leg swelling with erythema and calf tenderness NEUROLOGIC: Cranial nerves II through XII are intact. Muscle strength 5/5 in all extremities. Sensation intact. Gait not checked. Global weakness noted. PSYCHIATRIC: The patient is alert and oriented x 3.  SKIN: No obvious rash, lesion, or ulcer.    LABORATORY PANEL:   CBC Recent Labs  Lab 10/29/17 2236  10/30/17 0737  WBC 4.6  --   --   HGB 7.2*   < > 9.2*  HCT 22.4*   < > 27.6*  PLT 162  --   --    < > = values in this interval not displayed.   ------------------------------------------------------------------------------------------------------------------  Chemistries  Recent Labs  Lab 10/29/17 2236 10/30/17 0511  NA 136 137  K 4.1 4.2  CL 108 110  CO2 19* 16*  GLUCOSE 164* 219*  BUN 51* 50*  CREATININE 2.58* 2.18*  CALCIUM 8.4* 7.9*  MG 2.1  --   AST 32  --   ALT 29  --   ALKPHOS 91  --   BILITOT 2.2*  --    ------------------------------------------------------------------------------------------------------------------  Cardiac Enzymes Recent Labs  Lab 10/30/17 0511  TROPONINI <0.03   ------------------------------------------------------------------------------------------------------------------  RADIOLOGY:  Dg Chest Port 1 View  Result Date: 10/29/2017 CLINICAL DATA:  Syncopal episode EXAM: PORTABLE CHEST 1 VIEW COMPARISON:  10/15/2017 FINDINGS: Right-sided PICC line is noted with the catheter tip in the superior aspect of the right atrium. Lungs are well aerated bilaterally without  focal infiltrate. Some nodular appearing densities are noted in the bases bilaterally similar to that seen on prior CT examination. No other focal abnormality is  noted. IMPRESSION: Stable appearing metastatic disease within the lungs. PICC line as described. Electronically Signed   By: Inez Catalina M.D.   On: 10/29/2017 21:29    EKG:   Orders placed or performed during the hospital encounter of 10/29/17  . ED EKG  . ED EKG    ASSESSMENT AND PLAN:    62 year old female with past medical history significant for recent diagnosis of metastatic pancreatic cancer with DVT, diabetes, hypertension and hypercalcemia presents to hospital secondary to weakness and noted to have anemia with hemoglobin of 6.4  1. Acute on chronic anemia-recent admissions in the last couple of months with anemia secondary to GI bleed status post argon beam coagulation of bleeding gastric mucosa 3 times in the last 2 months. -Could be either GI bleed or from her recent chemotherapy -No active bleeding at this time. -Not on any anticoagulation. Status post blood transfusion and hemoglobin at 9.2. -Continue to monitor at this time. -on clear liquids at this time. GI has been consulted  2.hypotension-secondary to hypovolemia and hemorrhagic shock -Status post transfusion. On stress dose steroids -Remains on Levophed. Wean as tolerated  3. Bilateral DVT-with significant leg pain, started on fentanyl patch. -Ultrasound 2 weeks ago showing extensive occlusive bilateral lower extremity DVT. -Secondary to her pancreatic cancer. Patient is not a candidate for anticoagulation secondary to significant GI bleed on Xarelto. -Also has underlying anemia. She is status post IVC filter placement-  4. Hypothyroidism-continue Synthroid  5. Metastatic pancreatic cancer-status post first dose of chemotherapy last week. Oncology consult recommended. Overall poor prognosis  6. DVT prophylaxis-Ted's if she can tolerate  Recommend palliative care consult   All the records are reviewed and case discussed with Care Management/Social Workerr. Management plans discussed with the patient, family  and they are in agreement.  CODE STATUS: Full Code  TOTAL TIME TAKING CARE OF THIS PATIENT: 39 minutes.   POSSIBLE D/C IN  2-3 DAYS, DEPENDING ON CLINICAL CONDITION.   Gladstone Lighter M.D on 10/30/2017 at 8:59 AM  Between 7am to 6pm - Pager - [phone]  After 6pm go to [email or web address] - password [email or web address] [facility]  Office  [phone]  CC: Primary care physician; Lorelee Market, MD

## 2017-10-30 NOTE — Consult Note (Signed)
Lucilla Lame, MD [facility]  [address], [place], [place] [address] Phone: [phone] Fax : [phone]  Consultation  Referring Provider:     Dr. Estanislado Pandy Primary Care Physician:  Lorelee Market, MD Primary Gastroenterologist:  Dr. Allen Norris         Reason for Consultation:     Anemia  Date of Admission:  10/29/2017 Date of Consultation:  10/30/2017         HPI:   Ann Larson is a 62 y.o. female who has been in the hospital a few times with upper GI bleeding.  The patient was found to have a large inflammatory lesion in her stomach which was cauterized on multiple occasions.  The patient then came back and had further bleeding and underwent a CT scan that showed her to have a mass in her abdomen.  This was biopsied and consistent with pancreatic cancer.  The patient had a MRI back in July 2018 with suspicion of some abnormality and recommendation for repeat MRI in 3 months.  The patient never had that MRI done.  The patient is now admitted with weakness and was found to have a decreased hemoglobin.  On admission the patient had a hemoglobin of 6.4 which was down from her previous 8.0.  The patient was transfused and this morning has a hemoglobin of 9.2.  She denies any abdominal pain fevers chills nausea or vomiting.  The patient does report that she is been getting chemotherapy and has been found to have a DVT.  There is no report of any black stools or bloody stools or overt signs of any GI bleeding.  Past Medical History:  Diagnosis Date  . Depression   . Diabetes mellitus without complication ([place])   . Diverticulitis   . DVT (deep vein thrombosis) in pregnancy ([place]) 08/29/2017   Right popliteal and femoral  . GERD (gastroesophageal reflux disease)   . Hypercalcemia 10/22/2017  . Hyperlipidemia   . Hypertension   . Hypothyroidism   . Obesity   . Pancreatic cancer ([place]) 10/22/2017    Past Surgical History:  Procedure Laterality Date  . ABDOMINAL HYSTERECTOMY  2005    . CHOLECYSTECTOMY  2015  . COLONOSCOPY WITH PROPOFOL N/A 07/01/2015   Procedure: COLONOSCOPY WITH PROPOFOL;  Surgeon: Josefine Class, MD;  Location: [facility] ENDOSCOPY;  Service: Endoscopy;  Laterality: N/A;  . ESOPHAGOGASTRODUODENOSCOPY (EGD) WITH PROPOFOL N/A 07/01/2015   Procedure: ESOPHAGOGASTRODUODENOSCOPY (EGD) WITH PROPOFOL;  Surgeon: Josefine Class, MD;  Location: [facility] ENDOSCOPY;  Service: Endoscopy;  Laterality: N/A;  . ESOPHAGOGASTRODUODENOSCOPY (EGD) WITH PROPOFOL N/A 09/08/2017   Procedure: ESOPHAGOGASTRODUODENOSCOPY (EGD) WITH PROPOFOL;  Surgeon: Lucilla Lame, MD;  Location: [facility] ENDOSCOPY;  Service: Endoscopy;  Laterality: N/A;  . ESOPHAGOGASTRODUODENOSCOPY (EGD) WITH PROPOFOL N/A 09/21/2017   Procedure: ESOPHAGOGASTRODUODENOSCOPY (EGD) WITH PROPOFOL;  Surgeon: Lucilla Lame, MD;  Location: [facility] ENDOSCOPY;  Service: Endoscopy;  Laterality: N/A;  . ESOPHAGOGASTRODUODENOSCOPY (EGD) WITH PROPOFOL N/A 10/18/2017   Procedure: ESOPHAGOGASTRODUODENOSCOPY (EGD) WITH PROPOFOL;  Surgeon: [name], MD;  Location[facility] ENDOSCOPY;  Service: Gastroenterology;  Laterality: N/A;  . IVC FILTER INSERTION N/A 09/08/2017   Procedure: IVC FILTER INSERTION;  Surgeon: Algernon Huxley, MD;  Location: [place] CV LAB;  Service: Cardiovascular;  Laterality: N/A;    Prior to Admission medications   Medication Sig Start Date End Date Taking? Authorizing Provider  ferrous sulfate 325 (65 FE) MG EC tablet Take 1 tablet (325 mg total) by mouth 2 (two) times daily. 09/09/17 11/08/17 Yes  Sudini, Artist, MD  levothyroxine (SYNTHROID, LEVOTHROID) 125 MCG tablet Take 125 mcg by mouth daily before breakfast.   Yes [provider]  lisinopril (PRINIVIL,ZESTRIL) 20 MG tablet Take 20 mg by mouth daily. 07/04/17  Yes [provider]  metoprolol succinate (TOPROL-XL) 100 MG 24 hr tablet Take 50 mg by mouth daily. Take with or immediately following a meal.    Yes [provider]  omeprazole  (PRILOSEC) 40 MG capsule Take 1 capsule (40 mg total) by mouth 2 (two) times daily before a meal. 09/09/17  Yes Sudini, Alveta Heimlich, MD  oxyCODONE (OXY IR/ROXICODONE) 5 MG immediate release tablet Take 1 tablet (5 mg total) by mouth every 4 (four) hours as needed for moderate pain. 10/22/17  Yes Epifanio Lesches, MD  oxyCODONE (OXYCONTIN) 15 mg 12 hr tablet Take 1 tablet (15 mg total) by mouth every 12 (twelve) hours. 10/26/17  Yes Earlie Server, MD  pioglitazone (ACTOS) 45 MG tablet Take 45 mg by mouth daily. 06/18/17  Yes [provider]  protein supplement shake (PREMIER PROTEIN) LIQD Take 325 mLs (11 oz total) by mouth daily. 10/22/17  Yes Epifanio Lesches, MD  senna (SENOKOT) 8.6 MG TABS tablet Take 2 tablets (17.2 mg total) by mouth daily. Hold if loose stools. 10/24/17  Yes Earlie Server, MD  sucralfate (CARAFATE) 1 g tablet Take 1 tablet (1 g total) by mouth 4 (four) times daily. 10/09/17  Yes Josia Cueva, MD  TOUJEO SOLOSTAR 300 UNIT/ML SOPN Inject 15 Units into the skin daily. 10/22/17  Yes Epifanio Lesches, MD  venlafaxine XR (EFFEXOR-XR) 75 MG 24 hr capsule Take 75 mg by mouth daily with breakfast.   Yes [provider]  HYDROmorphone (DILAUDID) 2 MG tablet Take 1 tablet (2 mg total) by mouth every 4 (four) hours. Patient not taking: Reported on 10/29/2017 10/22/17   Epifanio Lesches, MD  ondansetron (ZOFRAN) 8 MG tablet Take 1 tablet (8 mg total) by mouth 2 (two) times daily as needed (Nausea or vomiting). Patient not taking: Reported on 10/25/2017 10/22/17   Earlie Server, MD  prochlorperazine (COMPAZINE) 10 MG tablet Take 1 tablet (10 mg total) by mouth every 6 (six) hours as needed (Nausea or vomiting). Patient not taking: Reported on 10/25/2017 10/22/17   Earlie Server, MD    Family History  Problem Relation Age of Onset  . Deep vein thrombosis Neg Hx      Social History   Tobacco Use  . Smoking status: Never Smoker  . Smokeless tobacco: Never Used  Substance Use Topics  . Alcohol  use: No  . Drug use: No    Allergies as of 10/29/2017 - Review Complete 10/29/2017  Allergen Reaction Noted  . Sulfa antibiotics  06/30/2015    Review of Systems:    All systems reviewed and negative except where noted in HPI.   Physical Exam:  Vital signs in last 24 hours: Temp:  [97.8 F (36.6 C)-98.8 F (37.1 C)] 98.8 F (37.1 C) (03/12 1200) Pulse Rate:  [82-102] 90 (03/12 1430) Resp:  [14-25] 20 (03/12 1430) BP: (66-126)/(37-97) 106/56 (03/12 1430) SpO2:  [92 %-100 %] 97 % (03/12 1430) Weight:  [231 lb 11.2 oz (105.1 kg)] 231 lb 11.2 oz (105.1 kg) (03/11 1747) Last BM Date: 10/27/17 General:   Pleasant, cooperative in NAD Head:  Normocephalic and atraumatic. Eyes:   No icterus.   Conjunctiva pink. PERRLA. Ears:  Normal auditory acuity. Neck:  Supple; no masses or thyroidomegaly Lungs: Respirations even and unlabored. Lungs clear to  auscultation bilaterally.   No wheezes, crackles, or rhonchi.  Heart:  Regular rate and rhythm;  Without murmur, clicks, rubs or gallops Abdomen:  Soft, nondistended, nontender. Normal bowel sounds. No appreciable masses or hepatomegaly.  No rebound or guarding.  Rectal:  Not performed. Msk:  Symmetrical without gross deformities.  S  Extremities: The patient has a tender left leg where she states her DVT is located. Neurologic:  Alert and oriented x3;  grossly normal neurologically. Skin:  Intact without significant lesions or rashes. Cervical Nodes:  No significant cervical adenopathy. Psych:  Alert and cooperative. Normal affect.  LAB RESULTS: Recent Labs    10/29/17 0945  10/29/17 2236 10/30/17 0511 10/30/17 0737  WBC 7.6  --  4.6  --   --   HGB 6.4*   < > 7.2* TEST WILL BE CREDITED 9.2*  HCT 20.2*   < > 22.4* TEST WILL BE CREDITED 27.6*  PLT 196  --  162  --   --    < > = values in this interval not displayed.   BMET Recent Labs    10/29/17 0945 10/29/17 2236 10/30/17 0511  NA 135 136 137  K 4.3 4.1 4.2  CL 104 108 110   CO2 18* 19* 16*  GLUCOSE 188* 164* 219*  BUN 44* 51* 50*  CREATININE 2.29* 2.58* 2.18*  CALCIUM 9.0 8.4* 7.9*   LFT Recent Labs    10/29/17 2236  PROT 6.4*  ALBUMIN 2.1*  AST 32  ALT 29  ALKPHOS 91  BILITOT 2.2*   PT/INR Recent Labs    10/29/17 2236  LABPROT 14.8  INR 1.17    STUDIES: Dg Chest Port 1 View  Result Date: 10/29/2017 CLINICAL DATA:  Syncopal episode EXAM: PORTABLE CHEST 1 VIEW COMPARISON:  10/15/2017 FINDINGS: Right-sided PICC line is noted with the catheter tip in the superior aspect of the right atrium. Lungs are well aerated bilaterally without focal infiltrate. Some nodular appearing densities are noted in the bases bilaterally similar to that seen on prior CT examination. No other focal abnormality is noted. IMPRESSION: Stable appearing metastatic disease within the lungs. PICC line as described. Electronically Signed   By: Inez Catalina M.D.   On: 10/29/2017 21:29      Impression / Plan:   Ann Larson is a 62 y.o. y/o female with has a history of pancreatic cancer which was proven by CT-guided biopsy.  The patient now was admitted with weakness and low hemoglobin.  The patient has not had any overt sign of GI bleeding and her hemoglobin may be down due to her recent chemotherapy.  I recommend following the patient's hemoglobin and if it continues to drop or she does any sign of GI bleeding then she may need a repeat EGD.  The patient has been explained the plan and agrees with it.  Thank you for involving me in the care of this patient.      LOS: 1 day   Lucilla Lame, MD  10/30/2017, 2:40 PM   Note: This dictation was prepared with Dragon dictation along with smaller phrase technology. Any transcriptional errors that result from this process are unintentional.

## 2017-10-30 NOTE — Progress Notes (Signed)
   [place] at [facility] Day: 1 day Ann Larson is a 62 y.o. female presenting with Weakness .   Advance care planning discussed with patient at bedside. All questions in regards to overall condition and expected prognosis answered. She states she is not ready to give [place] at this time.The decision was made to continuecurrent code status. Palliative care has been consulted  CODE STATUS: full code Time spent: 18 minutes

## 2017-10-30 NOTE — Progress Notes (Signed)
Pharmacy Antibiotic Note  Ann Larson is a 62 y.o. female admitted on 10/29/2017 with cellulitis.  Pharmacy has been consulted for ceftriaxone dosing.  Plan: Will transition patient from ceftriaxone 1g q 24h to ceftriaxone 2g q24h   Height: 5\' 9"  (175.3 cm) Weight: 231 lb 11.2 oz (105.1 kg) IBW/kg (Calculated) : 66.2  Temp (24hrs), Avg:98.4 F (36.9 C), Min:97.8 F (36.6 C), Max:98.8 F (37.1 C)  Recent Labs  Lab 10/24/17 1458 10/29/17 0945 10/29/17 2109 10/29/17 2236 10/30/17 0511  WBC 12.6* 7.6  --  4.6  --   CREATININE 1.27* 2.29*  --  2.58* 2.18*  LATICACIDVEN  --   --  0.8  --   --     Estimated Creatinine Clearance: 35 mL/min (A) (by C-G formula based on SCr of 2.18 mg/dL (H)).    Allergies  Allergen Reactions  . Sulfa Antibiotics     Antimicrobials this admission: 3/12 Ceftriaxone >>   Dose adjustments this admission: Ceftriaxone 2g q24q to ceftriaxone 1g q24h   Microbiology results: MRSA PCR: negative   Thank you for allowing pharmacy to be a part of this patient's care.  Candelaria Stagers, PharmD Pharmacy Resident  10/30/2017 3:26 PM

## 2017-10-30 NOTE — Progress Notes (Signed)
   10/30/17 1115  Clinical Encounter Type  Visited With Patient and family together  Visit Type Follow-up  Spiritual Encounters  Spiritual Needs Emotional;Prayer   Rounding on the unit, chaplain met with patient and family.  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  Chaplain prayed with patient and family.  During visit, chaplain was called to a code on another unit.  Family open to ongoing chaplain follow up.

## 2017-10-30 NOTE — Progress Notes (Signed)
Patient having trouble voiding, bladder scanned and 422 mL showed. Dr. Jefferson Fuel notified and gave verbal order for in and out cath. In and out cath preformed and 525 mL collected.  Wilnette Kales

## 2017-10-30 NOTE — Consult Note (Signed)
Consultation Note Date: 10/30/2017   Patient Name: Ann Larson  DOB: 16-Dec-1955  MRN: [id number]  Age / Sex: 62 y.o., female  PCP: Lorelee Market, MD Referring Physician: Gladstone Lighter, MD  Reason for Consultation: Establishing goals of care  HPI/Patient Profile: Ann Larson  is a 62 y.o. female with a known history of diabetes mellitus type 2, GERD, hypercalcemia, hyperlipidemia, hypertension, recently diagnosed pancreatic cancer on chemotherapy presented to the emergency room with generalized weakness. Patient had oncology doctor appointment but felt very weak.  She had a lot of fatigue and tiredness.  Hence came to the emergency room hemoglobin is around 6.4.      Clinical Assessment and Goals of Care: Patient resting in bed, son in law at bedside. 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 She states at the time of retirement, she was not as cheerful as she had been and the walks became long to the bathroom and cafeteria. She states she has been dealing with what she thought was chronic pancreatitis with stomach and bowel issues. She states she only found out 2-3 weeks ago that she has pancreatic cancer with metastasis. She states she has had 1 round of chemo and did well with it, but came here the day of next treatment for low hemaglobin.   She states she is tired of people coming in to ask if she has a support network and coming in being negative. She states she just would like for people to come in give her the daily updates, and ask her if there is anything she needs. We discussed the purpose of [place] conversations, and because this is so new, she wants to continue all aggressive care. She is hopeful God will heal her. Her goal is to be able to walk as she used to without feeling labored.    She has had nausea and a poor appetite, she does not want an  appetite stimulant. PRN Zofran in place. Pain controlled by current regimen. Will continue to follow.        SUMMARY OF RECOMMENDATIONS    Continue aggressive care. Palliative will continue to follow.   Code Status/Advance Care Planning:  Full code    Symptom Management:   Per primary team.   Palliative Prophylaxis:   Oral Care    Psycho-social/Spiritual:   Desire for further Chaplaincy support:chaplain continuing to follow.   Prognosis:   Poor. Pancreatic cancer with mets  Discharge Planning: To Be Determined      Primary Diagnoses: Present on Admission: . GI bleed   I have reviewed the medical record, interviewed the patient and family, and examined the patient. The following aspects are pertinent.  Past Medical History:  Diagnosis Date  . Depression   . Diabetes mellitus without complication ([place])   . Diverticulitis   . DVT (deep vein thrombosis) in pregnancy ([place]) 08/29/2017   Right popliteal and femoral  . GERD (gastroesophageal reflux disease)   . Hypercalcemia 10/22/2017  . Hyperlipidemia   .  Hypertension   . Hypothyroidism   . Obesity   . Pancreatic cancer ([place]) 10/22/2017   Social History   Socioeconomic History  . Marital status: Married    Spouse name: None  . Number of children: None  . Years of education: None  . Highest education level: None  Social Needs  . Financial resource strain: None  . Food insecurity - worry: None  . Food insecurity - inability: None  . Transportation needs - medical: None  . Transportation needs - non-medical: None  Occupational History  . None  Tobacco Use  . Smoking status: Never Smoker  . Smokeless tobacco: Never Used  Substance and Sexual Activity  . Alcohol use: No  . Drug use: No  . Sexual activity: None  Other Topics Concern  . None  Social History Narrative  . None   Family History  Problem Relation Age of Onset  . Deep vein thrombosis Neg Hx    Scheduled Meds: . fentaNYL  50 mcg  Transdermal Q72H  . ferrous sulfate  325 mg Oral BID  . hydrocortisone sod succinate (SOLU-CORTEF) inj  100 mg Intravenous Q8H  . insulin aspart  0-9 Units Subcutaneous TID WC  . levothyroxine  125 mcg Oral Q0600  . pantoprazole (PROTONIX) IV  40 mg Intravenous Q12H  . sodium chloride flush  10-40 mL Intracatheter Q12H  . venlafaxine XR  75 mg Oral Q breakfast   Continuous Infusions: . sodium chloride 75 mL/hr at 10/30/17 0700  . cefTRIAXone (ROCEPHIN)  IV    . norepinephrine (LEVOPHED) Adult infusion 6 mcg/min (10/30/17 1432)  . sodium chloride     PRN Meds:.acetaminophen **OR** acetaminophen, HYDROcodone-acetaminophen, HYDROmorphone (DILAUDID) injection, ondansetron **OR** ondansetron (ZOFRAN) IV, sodium chloride flush Medications Prior to Admission:  Prior to Admission medications   Medication Sig Start Date End Date Taking? Authorizing Provider  ferrous sulfate 325 (65 FE) MG EC tablet Take 1 tablet (325 mg total) by mouth 2 (two) times daily. 09/09/17 11/08/17 Yes Sudini, Alveta Heimlich, MD  levothyroxine (SYNTHROID, LEVOTHROID) 125 MCG tablet Take 125 mcg by mouth daily before breakfast.   Yes [provider]  lisinopril (PRINIVIL,ZESTRIL) 20 MG tablet Take 20 mg by mouth daily. 07/04/17  Yes [provider]  metoprolol succinate (TOPROL-XL) 100 MG 24 hr tablet Take 50 mg by mouth daily. Take with or immediately following a meal.    Yes [provider]  omeprazole (PRILOSEC) 40 MG capsule Take 1 capsule (40 mg total) by mouth 2 (two) times daily before a meal. 09/09/17  Yes Sudini, Alveta Heimlich, MD  oxyCODONE (OXY IR/ROXICODONE) 5 MG immediate release tablet Take 1 tablet (5 mg total) by mouth every 4 (four) hours as needed for moderate pain. 10/22/17  Yes Epifanio Lesches, MD  oxyCODONE (OXYCONTIN) 15 mg 12 hr tablet Take 1 tablet (15 mg total) by mouth every 12 (twelve) hours. 10/26/17  Yes Earlie Server, MD  pioglitazone (ACTOS) 45 MG tablet Take 45 mg by mouth daily. 06/18/17   Yes [provider]  protein supplement shake (PREMIER PROTEIN) LIQD Take 325 mLs (11 oz total) by mouth daily. 10/22/17  Yes Epifanio Lesches, MD  senna (SENOKOT) 8.6 MG TABS tablet Take 2 tablets (17.2 mg total) by mouth daily. Hold if loose stools. 10/24/17  Yes Earlie Server, MD  sucralfate (CARAFATE) 1 g tablet Take 1 tablet (1 g total) by mouth 4 (four) times daily. 10/09/17  Yes Wohl, Darren, MD  TOUJEO SOLOSTAR 300 UNIT/ML SOPN Inject 15 Units into the  skin daily. 10/22/17  Yes Epifanio Lesches, MD  venlafaxine XR (EFFEXOR-XR) 75 MG 24 hr capsule Take 75 mg by mouth daily with breakfast.   Yes [provider]  HYDROmorphone (DILAUDID) 2 MG tablet Take 1 tablet (2 mg total) by mouth every 4 (four) hours. Patient not taking: Reported on 10/29/2017 10/22/17   Epifanio Lesches, MD  ondansetron (ZOFRAN) 8 MG tablet Take 1 tablet (8 mg total) by mouth 2 (two) times daily as needed (Nausea or vomiting). Patient not taking: Reported on 10/25/2017 10/22/17   Earlie Server, MD  prochlorperazine (COMPAZINE) 10 MG tablet Take 1 tablet (10 mg total) by mouth every 6 (six) hours as needed (Nausea or vomiting). Patient not taking: Reported on 10/25/2017 10/22/17   Earlie Server, MD   Allergies  Allergen Reactions  . Sulfa Antibiotics    Review of Systems  Physical Exam  Vital Signs: BP (!) 99/59   Pulse 92   Temp 98.8 F (37.1 C) (Axillary)   Resp 14   Ht 5\' 9"  (1.753 m)   Wt 105.1 kg (231 lb 11.2 oz)   SpO2 97%   BMI 34.22 kg/m  Pain Assessment: No/denies pain POSS *See Group Information*: 1-Acceptable,Awake and alert Pain Score: 4    SpO2: SpO2: 97 % O2 Device:SpO2: 97 % O2 Flow Rate: .O2 Flow Rate (L/min): 2 L/min  IO: Intake/output summary:   Intake/Output Summary (Last 24 hours) at 10/30/2017 1552 Last data filed at 10/30/2017 1100 Gross per 24 hour  Intake 3279.26 ml  Output 525 ml  Net 2754.26 ml    LBM: Last BM Date: 10/27/17 Baseline Weight: Weight: 103.4 kg (228  lb) Most recent weight: Weight: 105.1 kg (231 lb 11.2 oz)     Palliative Assessment/Data:     Time In: 3:55 Time Out: 4:05 Time Total: 70 min Greater than 50%  of this time was spent counseling and coordinating care related to the above assessment and plan.  Signed by: Asencion Gowda, NP   Please contact Palliative Medicine Team phone at [phone] for questions and concerns.  For individual provider: See Shea Evans

## 2017-10-30 NOTE — Progress Notes (Signed)
Patient has not voided since in and out cath. Bladder scan showed 200 mL. No interventions at this time per protocol. Will report off to next shift. Wilnette Kales

## 2017-10-30 NOTE — Progress Notes (Signed)
Chaplain was following up from a previous visit. Pt asked chaplain to come in. Pt apologized to chaplain  And asked for forgiveness. Chaplain let her know there were no need for forgiveness. Chaplain discussed God's sovereignty in matters of life and death. Its ok to have the hope for healing but its really Gods decision. Its the unknown that we often have anxiety about . She asked for scripture. Chaplain let her know he will find it and report back.    10/30/17 1500  Clinical Encounter Type  Visited With Patient;Family  Visit Type Follow-up;Spiritual support  Referral From [place] Needs Prayer;Emotional

## 2017-10-31 DIAGNOSIS — C259 Malignant neoplasm of pancreas, unspecified: Secondary | ICD-10-CM

## 2017-10-31 DIAGNOSIS — Z86718 Personal history of other venous thrombosis and embolism: Secondary | ICD-10-CM

## 2017-10-31 DIAGNOSIS — C787 Secondary malignant neoplasm of liver and intrahepatic bile duct: Secondary | ICD-10-CM

## 2017-10-31 DIAGNOSIS — R42 Dizziness and giddiness: Secondary | ICD-10-CM

## 2017-10-31 DIAGNOSIS — C7800 Secondary malignant neoplasm of unspecified lung: Secondary | ICD-10-CM

## 2017-10-31 DIAGNOSIS — E860 Dehydration: Secondary | ICD-10-CM

## 2017-10-31 DIAGNOSIS — E119 Type 2 diabetes mellitus without complications: Secondary | ICD-10-CM

## 2017-10-31 DIAGNOSIS — Z794 Long term (current) use of insulin: Secondary | ICD-10-CM

## 2017-10-31 LAB — BPAM RBC
BLOOD PRODUCT EXPIRATION DATE: 201904042359
Blood Product Expiration Date: 201904042359
ISSUE DATE / TIME: 201903111227
ISSUE DATE / TIME: 201903120223
UNIT TYPE AND RH: 5100
UNIT TYPE AND RH: 5100

## 2017-10-31 LAB — TYPE AND SCREEN
ABO/RH(D): O POS
ANTIBODY SCREEN: NEGATIVE
UNIT DIVISION: 0
Unit division: 0

## 2017-10-31 LAB — BASIC METABOLIC PANEL
ANION GAP: 7 (ref 5–15)
BUN: 38 mg/dL — ABNORMAL HIGH (ref 6–20)
CHLORIDE: 112 mmol/L — AB (ref 101–111)
CO2: 19 mmol/L — ABNORMAL LOW (ref 22–32)
Calcium: 7.7 mg/dL — ABNORMAL LOW (ref 8.9–10.3)
Creatinine, Ser: 1.44 mg/dL — ABNORMAL HIGH (ref 0.44–1.00)
GFR calc non Af Amer: 38 mL/min — ABNORMAL LOW (ref >60)
GFR, EST AFRICAN AMERICAN: 44 mL/min — AB (ref >60)
Glucose, Bld: 297 mg/dL — ABNORMAL HIGH (ref 65–99)
POTASSIUM: 3.8 mmol/L (ref 3.5–5.1)
Sodium: 138 mmol/L (ref 135–145)

## 2017-10-31 LAB — CBC
HEMATOCRIT: 26.8 % — AB (ref 35.0–47.0)
HEMOGLOBIN: 8.5 g/dL — AB (ref 12.0–16.0)
MCH: 28.6 pg (ref 26.0–34.0)
MCHC: 31.8 g/dL — ABNORMAL LOW (ref 32.0–36.0)
MCV: 90 fL (ref 80.0–100.0)
Platelets: 111 10*3/uL — ABNORMAL LOW (ref 150–440)
RBC: 2.98 MIL/uL — ABNORMAL LOW (ref 3.80–5.20)
RDW: 17.9 % — ABNORMAL HIGH (ref 11.5–14.5)
WBC: 5.4 10*3/uL (ref 3.6–11.0)

## 2017-10-31 LAB — GLUCOSE, CAPILLARY
GLUCOSE-CAPILLARY: 250 mg/dL — AB (ref 65–99)
GLUCOSE-CAPILLARY: 197 mg/dL — AB (ref 65–99)
GLUCOSE-CAPILLARY: 155 mg/dL — AB (ref 65–99)
Glucose-Capillary: 253 mg/dL — ABNORMAL HIGH (ref 65–99)

## 2017-10-31 MED ORDER — SODIUM CHLORIDE 0.9 % IV BOLUS (SEPSIS)
1000.0000 mL | Freq: Once | INTRAVENOUS | Status: AC
  Administered 2017-10-31: 1000 mL via INTRAVENOUS

## 2017-10-31 MED ORDER — GABAPENTIN 100 MG PO CAPS
100.0000 mg | ORAL_CAPSULE | Freq: Two times a day (BID) | ORAL | Status: DC
  Administered 2017-10-31 – 2017-11-01 (×3): 100 mg via ORAL
  Filled 2017-10-31 (×3): qty 1

## 2017-10-31 MED ORDER — INSULIN ASPART 100 UNIT/ML ~~LOC~~ SOLN: 0.0000 [IU] | Freq: Every day | SUBCUTANEOUS | Status: DC

## 2017-10-31 MED ORDER — INSULIN ASPART 100 UNIT/ML ~~LOC~~ SOLN
0.0000 [IU] | Freq: Three times a day (TID) | SUBCUTANEOUS | Status: DC
  Administered 2017-10-31: 3 [IU] via SUBCUTANEOUS
  Administered 2017-10-31: 8 [IU] via SUBCUTANEOUS
  Administered 2017-11-01 (×2): 3 [IU] via SUBCUTANEOUS
  Administered 2017-11-01: 5 [IU] via SUBCUTANEOUS
  Administered 2017-11-02: 08:00:00 3 [IU] via SUBCUTANEOUS
  Administered 2017-11-02: 8 [IU] via SUBCUTANEOUS
  Administered 2017-11-02: 17:00:00 5 [IU] via SUBCUTANEOUS
  Administered 2017-11-03: 08:00:00 3 [IU] via SUBCUTANEOUS
  Administered 2017-11-03: 5 [IU] via SUBCUTANEOUS
  Filled 2017-10-31 (×10): qty 1

## 2017-10-31 MED ORDER — HYDROCORTISONE NA SUCCINATE PF 100 MG IJ SOLR
25.0000 mg | Freq: Two times a day (BID) | INTRAMUSCULAR | Status: DC
  Administered 2017-10-31 – 2017-11-02 (×4): 25 mg via INTRAVENOUS
  Filled 2017-10-31: qty 0.5
  Filled 2017-10-31 (×2): qty 2
  Filled 2017-10-31 (×2): qty 0.5

## 2017-10-31 NOTE — Progress Notes (Signed)
  Ann Lame, MD [facility]   [address], [place], [place] [address] Phone: [phone] Fax : [phone]    The patient was admitted with a presumed GI bleed since she has had 2 episodes of GI bleeding in the past.  The patient had been treated with coagulation at 1 episode of bleeding and with the most spray at the other.  The patient was found to have a inflammatory mass as the cause of her bleeding.  The patient has had no further sign of any GI bleeding and her anemia may be due to her recent chemotherapy.  We will hold off on any further endoscopic intervention at this time.   LOS: 2 days   Ann Larson 10/31/2017, 8:17 AM

## 2017-10-31 NOTE — Progress Notes (Signed)
Inpatient Diabetes Program Recommendations  AACE/ADA: New Consensus Statement on Inpatient Glycemic Control (2015)  Target Ranges:  Prepandial:   less than 140 mg/dL      Peak postprandial:   less than 180 mg/dL (1-2 hours)      Critically ill patients:  140 - 180 mg/dL   Results for GINAMARIE, BANFIELD (MRN [id number]) as of 10/31/2017 07:57  Ref. Range 10/30/2017 08:10 10/30/2017 11:25 10/30/2017 16:38 10/30/2017 21:46 10/31/2017 07:31  Glucose-Capillary Latest Ref Range: 65 - 99 mg/dL 180 (H) 227 (H) 197 (H) 226 (H) 250 (H)   Review of Glycemic Control  Diabetes history: DM2 Outpatient Diabetes medications: Actos 45 mg daily Current orders for Inpatient glycemic control: Novolog 0-9 units TID with meals  Inpatient Diabetes Program Recommendations: Correction (SSI): Please consider increasing Novolog correction to moderate scale (Novolog 0-15 units) and adding Novolog 0-5 units QHS for bedtime correction.  Thanks, Barnie Alderman, RN, MSN, CDE Diabetes Coordinator Inpatient Diabetes Program [phone] (Team Pager from 8am to 5pm)

## 2017-10-31 NOTE — Consult Note (Signed)
Hematology/Oncology Consult note [facility] [phone] [phone]  Patient Care Team: Lorelee Market, MD as PCP - General (Family Medicine)   Name of the patient: Ann Larson  [id number]  [birth year]   Date of visit: 10/31/17 REASON FOR COSULTATION:  Pancreatic cancer.   Pertinent Oncology History:   Ann Larson is a  62 y.o.  female with PMH listed below who was referred to me for follow up after hospital discharge. I saw patient during her recent admission.  This is a 62 year old female with history of  right lower extremity DVT in January 2019, GI bleeding due to anticoagulation, status post IVC filter who presents to emergency room with episode of epigastric abdominal pain, shortness of breath, fatigue, and near syncope. Hemoglobin was found to be 6.6. Denies seeing blood in the stool or having black stool. # 08/29/2017.Shedeveloped right lower extremity occlusive cough and popliteal DVT extending into the distal femoral vein on She was started on Xarelto and had subsequent GI bleeding.On 09/17/2017 she presented with symptomatic anemia, GI bleeding.She had another follow-up US venous lower extremity done,which showed propagation of deep vein thrombosis of the right lower extremitynow involving right common femoral and femoral veins in addition to previously identified right popliteal and calf veins. #EGD showed gastritis with hemorrhage, treated with argon plasma coagulation. Anticoagulation was held and patient had IVC filter placed. Ann Larson was hold.  2/3/19She had a repeat right lower extremity venous Doppler done which showed occlusive DVT of the right common femoral, superficial femoral, profunda femoral, popliteal, posterior tibial Veins. # 10/15/2017 Patient continues to have right thigh pain and discomfort and abdominal pain which triggered a CT abdomen pelvis angiogram which showed extensive near complete  occlusive thrombus extending from the common femoral vein to the IVC at the level of IVC filter, no definite thrombus noted above I VC, enlarged distal body and tail of the pancreas likely from extension of the previously seen pancreatic mass, metastatic disease including omental implant, splenic lesions, pulmonary nodules. Small a situs. Mildly enlarged porta hepatic lymph node as well as slightly rounded retroperitoneal and periaortic lymph nodes. Focal ill-defined heterogeneous enhancement of the left lobe of the liver. # Biopsy of omental nodule pathology revealed metastatic pancreatic cancer, CA 19.9 is 124,491, consistent with metastatic pancreatic cancer.  # Goal of care was discussed with patient. She is aware that she has very aggressive cancer and prognosis is very poor. Patient and family hope to at least try chemotherapy.  # 10/24/2016 - 10/26/2016, patient received cycle one Gemcitabine and Abraxane. Due to lack of access, she had picc line placed.    History of presenting illness-  This is a 62 yo female who is know to oncology service due to newly found metastatic pancreatic cancer, hemorraghic gastritis, bilateral massive lower extremity acute DVT presented to ED due to weakness and near syncope. She was found to be anemic with hemoglobin of 6.6 and in acute kidney failure, and also hypotension. Currently admitted in ICU. S/p 2 units of PRBCs transfusion, on levophed to support blood pressure. AKI improved with transfusion and hydration. Creatinine close to baseline.  Today patient reports feeling slightly better. She took a nap in the morning and is about to have her lunch. Continue to have lower extremity pain and swelling, L>R. Vascular surgeon recommends elevation of LE.    Review of systems- Review of Systems  Constitutional: Positive for malaise/fatigue. Negative for chills and fever.  HENT: Negative for nosebleeds.   Respiratory:  Negative for cough and sputum production.     Cardiovascular: Negative for chest pain and palpitations.  Gastrointestinal: Positive for abdominal pain. Negative for blood in stool, diarrhea, nausea and vomiting.  Genitourinary: Negative for dysuria and hematuria.  Musculoskeletal:       B/L LE pain  Skin: Negative for rash.  Neurological: Positive for weakness. Negative for dizziness and sensory change.  Endo/Heme/Allergies: Does not bruise/bleed easily.  Psychiatric/Behavioral: The patient is not nervous/anxious.     Allergies  Allergen Reactions  . Sulfa Antibiotics     Patient Active Problem List   Diagnosis Date Noted  . Goals of care, counseling/discussion 10/23/2017  . Malignant neoplasm of pancreas ([place]) 10/22/2017  . Hypercalcemia 10/22/2017  . Multiple lesions of metastatic malignancy ([place]) 10/16/2017  . Generalized abdominal pain   . Omental mass   . Pancreatic mass   . Hypothyroidism 10/15/2017  . Diabetes ([place]) 10/15/2017  . Hypertension 10/05/2017  . Deep vein thrombosis (DVT) of right lower extremity ([place]) 09/26/2017  . Personal history of peptic ulcer disease   . Gastritis with hemorrhage   . Symptomatic anemia   . Heme + stool   . Acute GI bleeding   . GI bleed 09/07/2017     Past Medical History:  Diagnosis Date  . Depression   . Diabetes mellitus without complication ([place])   . Diverticulitis   . DVT (deep vein thrombosis) in pregnancy ([place]) 08/29/2017   Right popliteal and femoral  . GERD (gastroesophageal reflux disease)   . Hypercalcemia 10/22/2017  . Hyperlipidemia   . Hypertension   . Hypothyroidism   . Obesity   . Pancreatic cancer ([place]) 10/22/2017     Past Surgical History:  Procedure Laterality Date  . ABDOMINAL HYSTERECTOMY  2005  . CHOLECYSTECTOMY  2015  . COLONOSCOPY WITH PROPOFOL N/A 07/01/2015   Procedure: COLONOSCOPY WITH PROPOFOL;  Surgeon: Josefine Class, MD;  Location: [facility] ENDOSCOPY;  Service: Endoscopy;  Laterality: N/A;  . ESOPHAGOGASTRODUODENOSCOPY (EGD) WITH  PROPOFOL N/A 07/01/2015   Procedure: ESOPHAGOGASTRODUODENOSCOPY (EGD) WITH PROPOFOL;  Surgeon: Josefine Class, MD;  Location: [facility] ENDOSCOPY;  Service: Endoscopy;  Laterality: N/A;  . ESOPHAGOGASTRODUODENOSCOPY (EGD) WITH PROPOFOL N/A 09/08/2017   Procedure: ESOPHAGOGASTRODUODENOSCOPY (EGD) WITH PROPOFOL;  Surgeon: Lucilla Lame, MD;  Location: [facility] ENDOSCOPY;  Service: Endoscopy;  Laterality: N/A;  . ESOPHAGOGASTRODUODENOSCOPY (EGD) WITH PROPOFOL N/A 09/21/2017   Procedure: ESOPHAGOGASTRODUODENOSCOPY (EGD) WITH PROPOFOL;  Surgeon: Lucilla Lame, MD;  Location: [facility] ENDOSCOPY;  Service: Endoscopy;  Laterality: N/A;  . ESOPHAGOGASTRODUODENOSCOPY (EGD) WITH PROPOFOL N/A 10/18/2017   Procedure: ESOPHAGOGASTRODUODENOSCOPY (EGD) WITH PROPOFOL;  Surgeon: [name], MD;  Location: [facility] ENDOSCOPY;  Service: Gastroenterology;  Laterality: N/A;  . IVC FILTER INSERTION N/A 09/08/2017   Procedure: IVC FILTER INSERTION;  Surgeon: Algernon Huxley, MD;  Location: [place] CV LAB;  Service: Cardiovascular;  Laterality: N/A;    Social History   Socioeconomic History  . Marital status: Married    Spouse name: Not on file  . Number of children: Not on file  . Years of education: Not on file  . Highest education level: Not on file  Social Needs  . Financial resource strain: Not on file  . Food insecurity - worry: Not on file  . Food insecurity - inability: Not on file  . Transportation needs - medical: Not on file  . Transportation needs - non-medical: Not on file  Occupational History  . Not on file  Tobacco Use  . Smoking status: Never Smoker  .  Smokeless tobacco: Never Used  Substance and Sexual Activity  . Alcohol use: No  . Drug use: No  . Sexual activity: Not on file  Other Topics Concern  . Not on file  Social History Narrative  . Not on file     Family History  Problem Relation Age of Onset  . Deep vein thrombosis Neg Hx      Current Facility-Administered Medications:  .  0.9 %   sodium chloride infusion, , Intravenous, Continuous, Pyreddy, Pavan, MD, Last Rate: 75 mL/hr at 10/31/17 1918 .  acetaminophen (TYLENOL) tablet 650 mg, 650 mg, Oral, Q6H PRN **OR** acetaminophen (TYLENOL) suppository 650 mg, 650 mg, Rectal, Q6H PRN, Pyreddy, Pavan, MD .  cefTRIAXone (ROCEPHIN) 1 g in sodium chloride 0.9 % 100 mL IVPB, 1 g, Intravenous, Q24H, Gladstone Lighter, MD, Stopped at 10/31/17 1703 .  fentaNYL (DURAGESIC - dosed mcg/hr) 50 mcg, 50 mcg, Transdermal, Q72H, Tukov, Magadalene S, NP, 50 mcg at 10/30/17 0010 .  ferrous sulfate tablet 325 mg, 325 mg, Oral, BID, Pyreddy, Pavan, MD, 325 mg at 10/31/17 2159 .  gabapentin (NEURONTIN) capsule 100 mg, 100 mg, Oral, BID, Laurann Montana, Crystal, NP, 100 mg at 10/31/17 2201 .  HYDROcodone-acetaminophen (HYCET) 7.5-325 mg/15 ml solution 15 mL, 15 mL, Oral, Q3H PRN, Patria Mane, Magadalene S, NP, 15 mL at 10/31/17 1633 .  hydrocortisone sodium succinate (SOLU-CORTEF) 100 MG injection 25 mg, 25 mg, Intravenous, Q12H, Conforti, John, DO, 25 mg at 10/31/17 1932 .  HYDROmorphone (DILAUDID) injection 1 mg, 1 mg, Intravenous, Q2H PRN, Tukov, Magadalene S, NP, 1 mg at 10/29/17 2151 .  insulin aspart (novoLOG) injection 0-15 Units, 0-15 Units, Subcutaneous, TID WC, Conforti, John, DO, 3 Units at 10/31/17 1633 .  insulin aspart (novoLOG) injection 0-5 Units, 0-5 Units, Subcutaneous, QHS, Conforti, John, DO .  levothyroxine (SYNTHROID, LEVOTHROID) tablet 125 mcg, 125 mcg, Oral, Q0600, Saundra Shelling, MD, 125 mcg at 10/31/17 0525 .  norepinephrine (LEVOPHED) 4mg  in D5W 230mL premix infusion, 0-40 mcg/min, Intravenous, Titrated, Saundra Shelling, MD, Stopped at 10/31/17 1842 .  ondansetron (ZOFRAN) tablet 4 mg, 4 mg, Oral, Q6H PRN **OR** ondansetron (ZOFRAN) injection 4 mg, 4 mg, Intravenous, Q6H PRN, Pyreddy, Pavan, MD, 4 mg at 10/29/17 1856 .  pantoprazole (PROTONIX) injection 40 mg, 40 mg, Intravenous, Q12H, Pyreddy, Pavan, MD, 40 mg at 10/31/17 2201 .  sodium  chloride 0.9 % bolus 500 mL, 500 mL, Intravenous, Once, Pyreddy, Pavan, MD .  sodium chloride flush (NS) 0.9 % injection 10-40 mL, 10-40 mL, Intracatheter, Q12H, Pyreddy, Pavan, MD, 10 mL at 10/31/17 2208 .  sodium chloride flush (NS) 0.9 % injection 10-40 mL, 10-40 mL, Intracatheter, PRN, Pyreddy, Pavan, MD .  venlafaxine XR (EFFEXOR-XR) 24 hr capsule 75 mg, 75 mg, Oral, Q breakfast, Pyreddy, Pavan, MD, 75 mg at 10/31/17 0812   Physical exam:  Vitals:   10/31/17 1815 10/31/17 1830 10/31/17 1845 10/31/17 1900  BP: (!) 101/57 (!) 101/55 101/68 104/63  Pulse: 92 87 90 87  Resp: 18 18 18 19   Temp:    98 F (36.7 C)  TempSrc:    Oral  SpO2: 94% 95% 98% 97%  Weight:      Height:        GENERAL:Alert, no distress and comfortable.  EYES:+Pallor, nonicterus OROPHARYNX: no thrush or ulceration; good dentition  NECK: supple, no masses felt LUNGS: clear to auscultation and No wheeze or crackles HEART/CVS: regular rate & rhythm and no murmurs; No lower extremity edema ABDOMEN: abdomen soft, has normal  bowel sounds. Diffuse discomfort with palpation.  Musculoskeletal:no cyanosis of digits and no clubbing. Lower extremities tenderness.  PSYCH: alert &oriented x 3  NEURO: no focal motor/sensory deficits SKIN: no rashes or significant lesions     CMP Latest Ref Rng & Units 10/31/2017  Glucose 65 - 99 mg/dL 297(H)  BUN 6 - 20 mg/dL 38(H)  Creatinine 0.44 - 1.00 mg/dL 1.44(H)  Sodium 135 - 145 mmol/L 138  Potassium 3.5 - 5.1 mmol/L 3.8  Chloride 101 - 111 mmol/L 112(H)  CO2 22 - 32 mmol/L 19(L)  Calcium 8.9 - 10.3 mg/dL 7.7(L)  Total Protein 6.5 - 8.1 g/dL -  Total Bilirubin 0.3 - 1.2 mg/dL -  Alkaline Phos 38 - 126 U/L -  AST 15 - 41 U/L -  ALT 14 - 54 U/L -   CBC Latest Ref Rng & Units 10/31/2017  WBC 3.6 - 11.0 K/uL 5.4  Hemoglobin 12.0 - 16.0 g/dL 8.5(L)  Hematocrit 35.0 - 47.0 % 26.8(L)  Platelets 150 - 440 K/uL 111(L)    @IMAGES @  Dg Chest 1 View  Result Date:  10/15/2017 CLINICAL DATA:  Upper abdominal pain EXAM: CHEST 1 VIEW COMPARISON:  09/07/2016 FINDINGS: The heart size and mediastinal contours are within normal limits. Both lungs are clear. The visualized skeletal structures are unremarkable. IMPRESSION: No active disease. Electronically Signed   By: Donavan Foil M.D.   On: 10/15/2017 22:48   Nm Pulmonary Perf And Vent  Result Date: 10/16/2017 CLINICAL DATA:  Chest pain and shortness of breath, high clinical suspicion of pulmonary embolism EXAM: NUCLEAR MEDICINE VENTILATION - PERFUSION LUNG SCAN TECHNIQUE: Ventilation images were obtained in multiple projections using inhaled aerosol Tc-78m DTPA. Perfusion images were obtained in multiple projections after intravenous injection of Tc-66m-MAA. RADIOPHARMACEUTICALS:  34.104 mCi of Tc-50m DTPA aerosol inhalation and 4.008 mCi Tc37m-MAA IV COMPARISON:  None Correlation: Chest radiograph 10/15/2017 FINDINGS: Ventilation: No focal ventilation defects. Minimal central airway deposition of aerosol. Small amount of swallowed aerosol within stomach. Perfusion: Single small subsegmental perfusion defect at if RIGHT upper lobe. No other definite focal perfusion abnormalities. Chest radiograph: Clear lungs.  Normal heart size. IMPRESSION: Very low probability for pulmonary embolism. Electronically Signed   By: Lavonia Dana M.D.   On: 10/16/2017 16:41   US Venous Img Lower Bilateral  Result Date: 10/17/2017 CLINICAL DATA:  Bilateral lower extremity pain and edema, right greater than left. History of prior DVT and IVC filter placement. EXAM: BILATERAL LOWER EXTREMITY VENOUS DOPPLER ULTRASOUND TECHNIQUE: Gray-scale sonography with graded compression, as well as color Doppler and duplex ultrasound were performed to evaluate the lower extremity deep venous systems from the level of the common femoral vein and including the common femoral, femoral, profunda femoral, popliteal and calf veins including the posterior tibial,  peroneal and gastrocnemius veins when visible. The superficial great saphenous vein was also interrogated. Spectral Doppler was utilized to evaluate flow at rest and with distal augmentation maneuvers in the common femoral, femoral and popliteal veins. COMPARISON:  Right lower extremity venous Doppler - 09/23/2017; 09/17/2017; 08/29/2017; CT scan abdomen pelvis-10/16/2017 FINDINGS: RIGHT LOWER EXTREMITY There is mixed echogenic occlusive DVT extending from the right common femoral vein through the saphenofemoral junction, the imaged portions of the deep femoral vein, the proximal, mid and distal aspects of the right femoral vein, the popliteal vein and extending to involve the imaged portions of the tibial veins. Other Findings:  None. LEFT LOWER EXTREMITY There is mixed echogenic occlusive DVT extending from the left common femoral  vein through the saphenofemoral junction, the imaged portions of the deep femoral vein, the proximal, mid and distal aspects of the left femoral vein, the popliteal vein and extending to involve the imaged portions of the tibial veins. Other Findings:  None. IMPRESSION: Examination is positive for extensive occlusive bilateral lower extremity DVT involving the entirety of the bilateral lower extremity venous system. Note, bilateral pelvic venous and IVC thrombus was seen on preceding abdominal CT performed 10/16/2017. These results will be called to the ordering clinician or representative by the Radiologist Assistant, and communication documented in the PACS or zVision Dashboard. Electronically Signed   By: Sandi Mariscal M.D.   On: 10/17/2017 11:02   Ct Biopsy  Result Date: 10/17/2017 INDICATION: Right lower quadrant omental mass, unknown primary EXAM: CT-GUIDED BIOPSY RIGHT LOWER QUADRANT OMENTAL MASS MEDICATIONS: 1% LIDOCAINE LOCAL ANESTHESIA/SEDATION: 2.0 mg IV Versed; 75 mcg IV Fentanyl Moderate Sedation Time:  11 MINUTES The patient was continuously monitored during the procedure  by the interventional radiology nurse under my direct supervision. PROCEDURE: The procedure, risks, benefits, and alternatives were explained to the patient. Questions regarding the procedure were encouraged and answered. The patient understands and consents to the procedure. Previous imaging reviewed. Patient positioned supine. Noncontrast localization CT performed. The right lower quadrant omental mass anterior to the cecum was localized. Overlying skin marked. Under sterile conditions and local anesthesia, a 17 gauge 6.8 cm access needle was advanced from a lateral approach to the right lower quadrant omental mass. Needle position confirmed with CT. 18 gauge core biopsies obtained. Samples placed in formalin. Needle removed. Postprocedure imaging demonstrates no hemorrhage or hematoma. Patient tolerated the procedure well without complication. Vital sign monitoring by nursing staff during the procedure will continue as patient is in the special procedures unit for post procedure observation. FINDINGS: The images document guide needle placement within the right lower quadrant omental mass. Post biopsy images demonstrate no hemorrhage or hematoma. COMPLICATIONS: None immediate. IMPRESSION: Successful CT-guided core biopsy of the right lower quadrant omental mass Electronically Signed   By: Jerilynn Mages.  Shick M.D.   On: 10/17/2017 13:14   Dg Chest Port 1 View  Result Date: 10/29/2017 CLINICAL DATA:  Syncopal episode EXAM: PORTABLE CHEST 1 VIEW COMPARISON:  10/15/2017 FINDINGS: Right-sided PICC line is noted with the catheter tip in the superior aspect of the right atrium. Lungs are well aerated bilaterally without focal infiltrate. Some nodular appearing densities are noted in the bases bilaterally similar to that seen on prior CT examination. No other focal abnormality is noted. IMPRESSION: Stable appearing metastatic disease within the lungs. PICC line as described. Electronically Signed   By: Inez Catalina M.D.   On:  10/29/2017 21:29   [place] Ekg Site Rite  Result Date: 10/26/2017 If Site Rite image not attached, placement could not be confirmed due to current cardiac rhythm.  Ct Angio Abd/pel W And/or Wo Contrast  Result Date: 10/16/2017 CLINICAL DATA:  62 year old female with history of pancreatitis presents with leg and abdominal pain. EXAM: CTA ABDOMEN AND PELVIS wITHOUT AND WITH CONTRAST TECHNIQUE: Multidetector CT imaging of the abdomen and pelvis was performed using the standard protocol during bolus administration of intravenous contrast. Multiplanar reconstructed images and MIPs were obtained and reviewed to evaluate the vascular anatomy. CONTRAST:  137mL ISOVUE-370 IOPAMIDOL (ISOVUE-370) INJECTION 76% COMPARISON:  Abdominal CT dated 02/07/2017 FINDINGS: VASCULAR Aorta: Mild atherosclerotic calcification. No aneurysmal dilatation or dissection. Celiac: Patent without evidence of aneurysm, dissection, vasculitis or significant stenosis. SMA: Patent without evidence of aneurysm,  dissection, vasculitis or significant stenosis. Renals: Both renal arteries are patent without evidence of aneurysm, dissection, vasculitis, fibromuscular dysplasia or significant stenosis. IMA: Patent without evidence of aneurysm, dissection, vasculitis or significant stenosis. Inflow: Mild atherosclerotic calcification of the iliac vessels. No aneurysmal dilatation or dissection. The iliac arteries are patent bilaterally. Proximal Outflow: Bilateral common femoral and visualized portions of the superficial and profunda femoral arteries are patent without evidence of aneurysm, dissection, vasculitis or significant stenosis. Veins: Extensive near occlusive thrombus extending from the visualized portions of the common femoral veins, external and common iliac veins and IVC to the level of the IVC filter. There is small amount of contrast in the peripheral lumen of these vessels. The suprarenal IVC filled with contrast and appears patent. No  definite thrombus identified above the level of the IVC filter. The SMV, the SMV, and main portal vein are patent. 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 No portal venous gas. Review of the MIP images confirms the above findings. NON-VASCULAR Lower chest: Multiple bilateral pulmonary nodules most consistent with metastatic disease. An infectious process or septic emboli are less likely. Clinical correlation is recommended. Partially visualized area of lower attenuation in the right lower lobe/infrahilar vascular confluence (series 6, image 5). This is incompletely evaluated and may represent volume averaging artifact with perihilar fat or scarring. Acute pulmonary embolus is not entirely excluded. Clinical correlation is recommended. If there is clinical concern for acute PE further evaluation with CTA of the chest or V/Q scan recommended. There is no intra-abdominal free air. Small upper abdominal ascites as well as small amount of fluid within the pelvis. Hepatobiliary: An ill-defined focal area of heterogeneous enhancement is seen in the left lobe of the liver (series 6, image 23). The liver is otherwise unremarkable. No intrahepatic biliary ductal dilatation. Cholecystectomy. Pancreas: There is fatty infiltration of the uncinate and head of the pancreas. There is prominence of the soft tissues of the distal body and tail of the pancreas which may represent extension of the previously seen pancreatic mass or pancreatitis. Correlation with pancreatic enzymes recommended. Spleen: Multiple small splenic hypodense lesions appear new or more prominent compared to the prior CT and concerning for extension of pancreatic neoplasm. Areas of infarct is less likely but not excluded. Adrenals/Urinary Tract: The adrenal glands are unremarkable. The kidneys, and the visualized ureters appear unremarkable as well. The urinary bladder is predominantly collapsed. There is apparent  diffuse thickening of the bladder wall which may be partly related to underdistention. Cystitis is not excluded. Correlation with urinalysis recommended. Stomach/Bowel: Postsurgical changes of bowel with anastomotic suture in the sigmoid colon. There is sigmoid diverticulosis without active inflammatory changes. Moderate amount of stool noted throughout the colon no bowel obstruction. Normal appendix. Lymphatic: Mildly enlarged lymph node in the porta hepaticas measuring 14 mm in short axis. Multiple mildly rounded small retroperitoneal and para-aortic lymph nodes. Reproductive: Hysterectomy. Other: Extensive omental implants and nodularity, new compared to prior CT and consistent with omental caking. Musculoskeletal: Degenerative changes of the spine. No acute osseous pathology. IMPRESSION: VASCULAR 1. Extensive near complete occlusive thrombus extending from the common femoral veins to the IVC at the level of the IVC filter. No definite thrombus noted above the IVC. 2. Partially visualized small low-attenuation in the right lower lobe/right infrahilar pulmonary artery confluence may represent perivascular hilar fat or scarring. Acute PE is not entirely excluded. CTA of the chest or V/Q scan may provide better evaluation if there is clinical concern  for acute PE. 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 NON-VASCULAR 1. Enlargement of the distal body and tail of the pancreas likely from extension of the previously seen pancreatic mass. 2. Metastatic disease including omental implant, splenic lesions, and pulmonary nodules. Small ascites, likely malignant. Further evaluation with PET-CT and oncological consult is advised. 3. Mildly enlarged porta hepatic lymph node as well as slightly rounded retroperitoneal and para-aortic lymph nodes. 4. Focal ill-defined heterogeneous enhancement of the left lobe of the liver. These results were called by telephone at the time of interpretation on 10/16/2017 at 1:28 am to Dr.  Jannifer Franklin, who verbally acknowledged these results. Electronically Signed   By: Anner Crete M.D.   On: 10/16/2017 01:29     Assessment and plan- Patient is a42 y.o.femalewith history of hemorraghic gastritis, extensivebilaterallower extremity DVT not on anticoagulation, newly diagnosed metastatic pancreatic cancer currently admitted due to generalized weakness, near syncope, hypotension. Marland Kitchen   #Metastatic pancreatic cancer, involving lung, liver, omental caking. Goal of care has been discussed multiple times with patient and she is aware of her extremely poor prognosis. She remains in good spirit and hope to try chemotherapy. s/p cycle 1 day 1 Gemcitabine and Abraxane.  Patient is not willing to discuss much of her prognosis today.  Agree with involving palliative care, discussed with Crystal.   # symptomatic anemia, likely combination of bleeding ulcer and chemotherapy side effects. Agree with PRBC transfusion and keep Hemoglobin >7 # Hypotension: mange per ICU. On levophed.  # Hypercalcemia secondary to metastatic pancreatic cancer, S/p outpatient Zometa  3mg  x 1, calcium level comes down.   # Extensivebilaterallower extremity DVT: anticoagulation is contraindicated at this point due to ongoing blood loss. DVT is related to her metastatic tumor burden. continue elevation of LE, pain control.   Thank you for this kind referral and the opportunity to participate in the care of this patient.   Earlie Server, MD, PhD Hematology Oncology [facility] Pager- [phone] 10/31/2017

## 2017-10-31 NOTE — Progress Notes (Addendum)
[place] [facility] [place] NAME: Ann Larson    MR#:  [id number]  DATE OF BIRTH:  April 27, 1956  SUBJECTIVE:  CHIEF COMPLAINT:   Chief Complaint  Patient presents with  . Weakness   - feels some better today, hb stable at 8.5 - received 2 units Tx on admission - left leg swelling is slightly better - remains on 5mcg of levophed  REVIEW OF SYSTEMS:  Review of Systems  Constitutional: Positive for malaise/fatigue. Negative for chills and fever.  HENT: Negative for ear discharge, hearing loss and nosebleeds.   Eyes: Negative for blurred vision and double vision.  Respiratory: Negative for cough, shortness of breath and wheezing.   Cardiovascular: Positive for leg swelling. Negative for chest pain and palpitations.  Gastrointestinal: Negative for abdominal pain, constipation, diarrhea, nausea and vomiting.  Genitourinary: Negative for dysuria.  Musculoskeletal: Positive for myalgias.  Neurological: Negative for dizziness, speech change, focal weakness, seizures and headaches.  Psychiatric/Behavioral: Negative for depression.    DRUG ALLERGIES:   Allergies  Allergen Reactions  . Sulfa Antibiotics     VITALS:  Blood pressure (!) 101/52, pulse 95, temperature 98.9 F (37.2 C), temperature source Axillary, resp. rate (!) 25, height 5\' 9"  (1.753 m), weight 105.1 kg (231 lb 11.2 oz), SpO2 97 %.  PHYSICAL EXAMINATION:  Physical Exam  GENERAL:  62 y.o.-year-old patient lying in the bed with no acute distress.  EYES: Pupils equal, round, reactive to light and accommodation. No scleral icterus. Extraocular muscles intact.  HEENT: Head atraumatic, normocephalic. Oropharynx and nasopharynx clear.  NECK:  Supple, no jugular venous distention. No thyroid enlargement, no tenderness.  LUNGS: Normal breath sounds bilaterally, no wheezing, rales,rhonchi or crepitation. No use of accessory muscles of respiration. Decreased bibasilar breath  sounds CARDIOVASCULAR: S1, S2 normal. No murmurs, rubs, or gallops.  ABDOMEN: Soft, nontender, nondistended. Bowel sounds present. No organomegaly or mass.  EXTREMITIES: No  cyanosis, or clubbing. 2+ right leg edema and 3+ left leg swelling with erythema and calf tenderness NEUROLOGIC: Cranial nerves II through XII are intact. Muscle strength 5/5 in all extremities. Sensation intact. Gait not checked. Global weakness noted. PSYCHIATRIC: The patient is alert and oriented x 3.  SKIN: No obvious rash, lesion, or ulcer.    LABORATORY PANEL:   CBC Recent Labs  Lab 10/31/17 0817  WBC 5.4  HGB 8.5*  HCT 26.8*  PLT 111*   ------------------------------------------------------------------------------------------------------------------  Chemistries  Recent Labs  Lab 10/29/17 2236  10/31/17 0817  NA 136   < > 138  K 4.1   < > 3.8  CL 108   < > 112*  CO2 19*   < > 19*  GLUCOSE 164*   < > 297*  BUN 51*   < > 38*  CREATININE 2.58*   < > 1.44*  CALCIUM 8.4*   < > 7.7*  MG 2.1  --   --   AST 32  --   --   ALT 29  --   --   ALKPHOS 91  --   --   BILITOT 2.2*  --   --    < > = values in this interval not displayed.   ------------------------------------------------------------------------------------------------------------------  Cardiac Enzymes Recent Labs  Lab 10/30/17 1043  TROPONINI <0.03   ------------------------------------------------------------------------------------------------------------------  RADIOLOGY:  Dg Chest Port 1 View  Result Date: 10/29/2017 CLINICAL DATA:  Syncopal episode EXAM: PORTABLE CHEST 1 VIEW COMPARISON:  10/15/2017 FINDINGS: Right-sided PICC line is noted with  the catheter tip in the superior aspect of the right atrium. Lungs are well aerated bilaterally without focal infiltrate. Some nodular appearing densities are noted in the bases bilaterally similar to that seen on prior CT examination. No other focal abnormality is noted. IMPRESSION:  Stable appearing metastatic disease within the lungs. PICC line as described. Electronically Signed   By: Inez Catalina M.D.   On: 10/29/2017 21:29    EKG:   Orders placed or performed during the hospital encounter of 10/29/17  . ED EKG  . ED EKG  . EKG    ASSESSMENT AND PLAN:    62 year old female with past medical history significant for recent diagnosis of metastatic pancreatic cancer with DVT, diabetes, hypertension and hypercalcemia presents to hospital secondary to weakness and noted to have anemia with hemoglobin of 6.4  1. Acute on chronic anemia-recent admissions in the last couple of months with anemia secondary to GI bleed status post argon beam coagulation of bleeding gastric mucosa 3 times in the last 2 months. -Could be either GI bleed or from her recent chemotherapy -No active bleeding at this time. -Not on any anticoagulation. Status post blood transfusion and hemoglobin at 8.5. -Continue to monitor at this time. On IV protonix -advanced to full liquids as GI recommended, no plans for any procedure  2.hypotension-secondary to hypovolemia and hemorrhagic shock -Status post transfusion. On stress dose steroids -Remains on Levophed. Wean as tolerated  3. Bilateral DVT-with significant leg pain, started on fentanyl patch. -Ultrasound 2 weeks ago showing extensive occlusive bilateral lower extremity DVT. -Secondary to her pancreatic cancer. Patient is not a candidate for anticoagulation secondary to significant GI bleed on Xarelto. -Also has underlying anemia. She is status post IVC filter placement-  4. ARF- ATN from anemia and hypertension. Improving with transfusion and fluids. Creatinine close to baseline -Avoid nephrotoxins  5. Metastatic pancreatic cancer-status post first dose of chemotherapy last week. Oncology consult recommended. Overall poor prognosis  6. DVT prophylaxis-Ted's if she can tolerate  appreciate palliative care consult -can be transferred to  oncology floor once off of Levophed  -we'll - get physical therapy consult after Floor transfer   All the records are reviewed and case discussed with Care Management/Social Workerr. Management plans discussed with the patient, family and they are in agreement.  CODE STATUS: Full Code  TOTAL TIME TAKING CARE OF THIS PATIENT: 36 minutes.   POSSIBLE D/C IN  2-3 DAYS, DEPENDING ON CLINICAL CONDITION.   Gladstone Lighter M.D on 10/31/2017 at 9:13 AM  Between 7am to 6pm - Pager - [phone]  After 6pm go to [email or web address] - password [email or web address] [facility]  Office  [phone]  CC: Primary care physician; Lorelee Market, MD

## 2017-10-31 NOTE — Progress Notes (Signed)
[facility] Progess Note    SYNOPSIS   Patient with metastatic pancreatic cancer to liver and lungs, presented with hypotension, gastrointestinal bleeding, swollen left leg with prior history of DVT/pulmonary emboli, status post filter placement, unable to anticoagulate secondary to recurrent bleeding  ASSESSMENT/PLAN   Shock. Patient's hemoglobin is stable, is still on norepinephrine. May reflect a component of sepsis as patient left lower extremity initial cellulitis. We'll try to wean as tolerated.  GI bleed. No further evidence of bleeding  DVT. Prior history of deep venous thrombosis, status post IVC filter, improved left lower extremity welling  Renal failure. Improved, multifactorial etiology to include volume contraction, hypotension with ATN  Metastatic pancreatic cancer  Hyperglycemia. On scale coverage    INTAKE / OUTPUT:  Intake/Output Summary (Last 24 hours) at 10/31/2017 0815 Last data filed at 10/31/2017 0600 Gross per 24 hour  Intake 3264.82 ml  Output 975 ml  Net 2289.82 ml    Name: Ann Larson MRN: [id number] DOB: Jun 21, 1956    ADMISSION DATE:  10/29/2017  SUBJECTIVE:   Patient still on low-dose norepinephrine. States that her leg has improved. She states she has adequate pain control  VITAL SIGNS: Temp:  [97.7 F (36.5 C)-98.8 F (37.1 C)] 97.7 F (36.5 C) (03/13 0400) Pulse Rate:  [79-95] 88 (03/13 0600) Resp:  [13-27] 18 (03/13 0600) BP: (71-114)/(49-77) 98/57 (03/13 0600) SpO2:  [92 %-100 %] 98 % (03/13 0600)  PhYSICAL EXAMINATION: Physical Examination:   VS: BP (!) 98/57   Pulse 88   Temp 97.7 F (36.5 C) (Oral)   Resp 18   Ht 5\' 9"  (1.753 m)   Wt 105.1 kg (231 lb 11.2 oz)   SpO2 98%   BMI 34.22 kg/m   General Appearance: No distress  Neuro:without focal findings, mental status normal. Pulmonary: normal breath sounds   CardiovascularNormal S1,S2.  No m/r/g.   Abdomen: Benign, Soft,  non-tender. Extremities: improved left lower extremity swelling    LABORATORY PANEL:   CBC Recent Labs  Lab 10/29/17 2236  10/30/17 1643  WBC 4.6  --   --   HGB 7.2*   < > 8.9*  HCT 22.4*   < > 27.4*  PLT 162  --   --    < > = values in this interval not displayed.    Chemistries  Recent Labs  Lab 10/29/17 2236 10/30/17 0511  NA 136 137  K 4.1 4.2  CL 108 110  CO2 19* 16*  GLUCOSE 164* 219*  BUN 51* 50*  CREATININE 2.58* 2.18*  CALCIUM 8.4* 7.9*  MG 2.1  --   PHOS 3.2  --   AST 32  --   ALT 29  --   ALKPHOS 91  --   BILITOT 2.2*  --     Recent Labs  Lab 10/29/17 2053 10/30/17 0810 10/30/17 1125 10/30/17 1638 10/30/17 2146 10/31/17 0731  GLUCAP 143* 180* 227* 197* 226* 250*   No results for input(s): PHART, PCO2ART, PO2ART in the last 168 hours. Recent Labs  Lab 10/24/17 1458 10/29/17 2236  AST 24 32  ALT 40 29  ALKPHOS 131* 91  BILITOT 0.6 2.2*  ALBUMIN 2.8* 2.1*    Cardiac Enzymes Recent Labs  Lab 10/30/17 1043  TROPONINI <0.03    RADIOLOGY:  Dg Chest Port 1 View  Result Date: 10/29/2017 CLINICAL DATA:  Syncopal episode EXAM: PORTABLE CHEST 1 VIEW COMPARISON:  10/15/2017 FINDINGS: Right-sided PICC line is noted with the catheter tip  in the superior aspect of the right atrium. Lungs are well aerated bilaterally without focal infiltrate. Some nodular appearing densities are noted in the bases bilaterally similar to that seen on prior CT examination. No other focal abnormality is noted. IMPRESSION: Stable appearing metastatic disease within the lungs. PICC line as described. Electronically Signed   By: Inez Catalina M.D.   On: 10/29/2017 21:29   Hermelinda Dellen, DO 10/31/2017

## 2017-10-31 NOTE — Progress Notes (Addendum)
Daily Progress Note   Patient Name: Ann Larson       Date: 10/31/2017 DOB: 1955-10-01  Age: 62 y.o. MRN#: [id number] Attending Physician: Ann Lighter, MD Primary Care Physician: Ann Market, MD Admit Date: 10/29/2017  Reason for Consultation/Follow-up: Establishing goals of care and Pain control  Subjective: Patient resting in bed. She has just eaten about 50% of her breakfast; she denies nausea. She states her goal is to be able to walk like she did before. She states she has burning pain in her foot, and feels like something is sticking into her foot. Neurontin initiated. 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  Patient explained yesterday, she was recently diagnosed with cancer and does not want to discuss the negative, she wants to focus on the positive. Yesterday, she explained her favorite Bible passage being 2 Kings 20:1-7 regarding God granting 15 more years of life.Today she states she understands God will do his will in her life. She would like to continue focus on care needed for goal of improvement in status.    Length of Stay: 2  Current Medications: Scheduled Meds:  . fentaNYL  50 mcg Transdermal Q72H  . ferrous sulfate  325 mg Oral BID  . gabapentin  100 mg Oral BID  . hydrocortisone sod succinate (SOLU-CORTEF) inj  100 mg Intravenous Q8H  . insulin aspart  0-9 Units Subcutaneous TID WC  . levothyroxine  125 mcg Oral Q0600  . pantoprazole (PROTONIX) IV  40 mg Intravenous Q12H  . sodium chloride flush  10-40 mL Intracatheter Q12H  . venlafaxine XR  75 mg Oral Q breakfast    Continuous Infusions: . sodium chloride 75 mL/hr at 10/31/17 0600  . cefTRIAXone (ROCEPHIN)  IV Stopped (10/30/17 1902)    . norepinephrine (LEVOPHED) Adult infusion 3 mcg/min (10/31/17 0820)  . sodium chloride      PRN Meds: acetaminophen **OR** acetaminophen, HYDROcodone-acetaminophen, HYDROmorphone (DILAUDID) injection, ondansetron **OR** ondansetron (ZOFRAN) IV, sodium chloride flush  Physical Exam  Constitutional: No distress.  Pulmonary/Chest: Effort normal.  Neurological: She is alert.  Oriented.   Skin: Skin is warm and dry.            Vital Signs: BP (!) 101/52   Pulse 95  Temp 98.9 F (37.2 C) (Axillary)   Resp (!) 25   Ht 5\' 9"  (1.753 m)   Wt 105.1 kg (231 lb 11.2 oz)   SpO2 97%   BMI 34.22 kg/m  SpO2: SpO2: 97 % O2 Device: O2 Device: Nasal Cannula O2 Flow Rate: O2 Flow Rate (L/min): 2 L/min  Intake/output summary:   Intake/Output Summary (Last 24 hours) at 10/31/2017 8676 Last data filed at 10/31/2017 0600 Gross per 24 hour  Intake 3264.82 ml  Output 975 ml  Net 2289.82 ml   LBM: Last BM Date: 10/27/17 Baseline Weight: Weight: 103.4 kg (228 lb) Most recent weight: Weight: 105.1 kg (231 lb 11.2 oz)       Palliative Assessment/Data: 50%      Patient Active Problem List   Diagnosis Date Noted  . Goals of care, counseling/discussion 10/23/2017  . Malignant neoplasm of pancreas ([place]) 10/22/2017  . Hypercalcemia 10/22/2017  . Multiple lesions of metastatic malignancy ([place]) 10/16/2017  . Generalized abdominal pain   . Omental mass   . Pancreatic mass   . Hypothyroidism 10/15/2017  . Diabetes ([place]) 10/15/2017  . Hypertension 10/05/2017  . Deep vein thrombosis (DVT) of right lower extremity ([place]) 09/26/2017  . Personal history of peptic ulcer disease   . Gastritis with hemorrhage   . Symptomatic anemia   . Heme + stool   . Acute GI bleeding   . GI bleed 09/07/2017    Palliative Care Assessment & Plan   Patient Profile:  [name] a56 y.o.femalewith a known history of diabetes mellitus type 2, GERD, hypercalcemia, hyperlipidemia, hypertension,  recently diagnosed pancreatic cancer on chemotherapy presented to the emergency room with generalized weakness. Patient had oncology doctor appointment but felt very weak.She had a lot of fatigue and tiredness.Hence came to the emergency room hemoglobin is around 6.4.   Assessment/Recommendations/Plan: Neuropathic pain, Neurontin initiated.   Code Status:    Code Status Orders  (From admission, onward)        Start     Ordered   10/29/17 1131  Full code  Continuous     10/29/17 1130    Code Status History    Date Active Date Inactive Code Status Order ID Comments User Context   10/16/2017 00:26 10/22/2017 20:38 Full Code [id number]  Lance Coon, MD ED   09/07/2017 16:09 09/09/2017 16:37 Full Code [id number]  Hillary Bow, MD ED       Prognosis:   poor, metastatic cancer.   Discharge Planning:  To Be Determined  Care plan was discussed with Dr. Gillermina Phy  Thank you for allowing the Palliative Medicine Team to assist in the care of this patient.   Time In: 8:50 Time Out: 9:55 Total Time 65 min Prolonged Time Billed  yes      Greater than 50%  of this time was spent counseling and coordinating care related to the above assessment and plan.  Ann Gowda, NP  Please contact Palliative Medicine Team phone at [phone] for questions and concerns.

## 2017-11-01 DIAGNOSIS — M79609 Pain in unspecified limb: Secondary | ICD-10-CM

## 2017-11-01 LAB — CBC
HCT: 25.6 % — ABNORMAL LOW (ref 35.0–47.0)
HEMOGLOBIN: 8.2 g/dL — AB (ref 12.0–16.0)
MCH: 28.6 pg (ref 26.0–34.0)
MCHC: 31.8 g/dL — ABNORMAL LOW (ref 32.0–36.0)
MCV: 89.9 fL (ref 80.0–100.0)
Platelets: 85 10*3/uL — ABNORMAL LOW (ref 150–440)
RBC: 2.85 MIL/uL — AB (ref 3.80–5.20)
RDW: 17.3 % — ABNORMAL HIGH (ref 11.5–14.5)
WBC: 6.7 10*3/uL (ref 3.6–11.0)

## 2017-11-01 LAB — GLUCOSE, CAPILLARY
GLUCOSE-CAPILLARY: 178 mg/dL — AB (ref 65–99)
GLUCOSE-CAPILLARY: 180 mg/dL — AB (ref 65–99)
Glucose-Capillary: 203 mg/dL — ABNORMAL HIGH (ref 65–99)
Glucose-Capillary: 175 mg/dL — ABNORMAL HIGH (ref 65–99)

## 2017-11-01 MED ORDER — PANTOPRAZOLE SODIUM 40 MG PO TBEC
40.0000 mg | DELAYED_RELEASE_TABLET | Freq: Two times a day (BID) | ORAL | Status: DC
  Administered 2017-11-01: 23:00:00 40 mg via ORAL
  Filled 2017-11-01: qty 1

## 2017-11-01 MED ORDER — SENNOSIDES-DOCUSATE SODIUM 8.6-50 MG PO TABS
1.0000 | ORAL_TABLET | Freq: Two times a day (BID) | ORAL | Status: DC
  Administered 2017-11-01: 1 via ORAL
  Filled 2017-11-01: qty 1

## 2017-11-01 MED ORDER — GABAPENTIN 100 MG PO CAPS
200.0000 mg | ORAL_CAPSULE | Freq: Two times a day (BID) | ORAL | Status: DC
  Administered 2017-11-01: 200 mg via ORAL
  Filled 2017-11-01: qty 2

## 2017-11-01 MED ORDER — NYSTATIN 100000 UNIT/ML MT SUSP
5.0000 mL | Freq: Four times a day (QID) | OROMUCOSAL | Status: DC
  Administered 2017-11-01 – 2017-11-03 (×7): 500000 [IU] via ORAL
  Filled 2017-11-01 (×7): qty 5

## 2017-11-01 MED ORDER — SENNOSIDES-DOCUSATE SODIUM 8.6-50 MG PO TABS
2.0000 | ORAL_TABLET | Freq: Two times a day (BID) | ORAL | Status: DC
  Administered 2017-11-01: 2 via ORAL
  Filled 2017-11-01: qty 2

## 2017-11-01 MED ORDER — SIMETHICONE 80 MG PO CHEW
80.0000 mg | CHEWABLE_TABLET | Freq: Four times a day (QID) | ORAL | Status: DC | PRN
  Administered 2017-11-01: 80 mg via ORAL
  Filled 2017-11-01 (×2): qty 1

## 2017-11-01 NOTE — Progress Notes (Signed)
Daily Progress Note   Patient Name: Ann Larson       Date: 11/01/2017 DOB: [birth year]  Age: 62 y.o. MRN#: [id number] Attending Physician: Gladstone Lighter, MD Primary Care Physician: Lorelee Market, MD Admit Date: 10/29/2017  Reason for Consultation/Follow-up: Establishing goals of care and Pain control  Subjective: Patient resting in bed. She states she is happy she is moving to a regular room today. She tells me the burning pain in her foot is improving. Increased Neurontin to 200mg  BID.    Length of Stay: 3  Current Medications: Scheduled Meds:  . fentaNYL  50 mcg Transdermal Q72H  . ferrous sulfate  325 mg Oral BID  . gabapentin  200 mg Oral BID  . hydrocortisone sod succinate (SOLU-CORTEF) inj  25 mg Intravenous Q12H  . insulin aspart  0-15 Units Subcutaneous TID WC  . insulin aspart  0-5 Units Subcutaneous QHS  . levothyroxine  125 mcg Oral Q0600  . pantoprazole (PROTONIX) IV  40 mg Intravenous Q12H  . senna-docusate  1 tablet Oral BID  . sodium chloride flush  10-40 mL Intracatheter Q12H  . venlafaxine XR  75 mg Oral Q breakfast    Continuous Infusions: . sodium chloride 75 mL/hr at 11/01/17 0808  . cefTRIAXone (ROCEPHIN)  IV Stopped (10/31/17 1703)  . norepinephrine (LEVOPHED) Adult infusion Stopped (10/31/17 1842)  . sodium chloride      PRN Meds: acetaminophen **OR** acetaminophen, HYDROcodone-acetaminophen, HYDROmorphone (DILAUDID) injection, ondansetron **OR** ondansetron (ZOFRAN) IV, simethicone, sodium chloride flush  Physical Exam  Constitutional: No distress.  Pulmonary/Chest: Effort normal.  Neurological: She is alert.  Oriented.   Skin: Skin is warm and dry.            Vital Signs: BP (!) 96/57   Pulse 91   Temp 98 F (36.7 C) (Oral)    Resp (!) 21   Ht 5\' 9"  (1.753 m)   Wt 105.1 kg (231 lb 11.2 oz)   SpO2 97%   BMI 34.22 kg/m  SpO2: SpO2: 97 % O2 Device: O2 Device: Room Air O2 Flow Rate: O2 Flow Rate (L/min): 2 L/min  Intake/output summary:   Intake/Output Summary (Last 24 hours) at 11/01/2017 0901 Last data filed at 11/01/2017 0400 Gross per 24 hour  Intake 1879.67 ml  Output 450 ml  Net 1429.67 ml  LBM: Last BM Date: 10/27/17 Baseline Weight: Weight: 103.4 kg (228 lb) Most recent weight: Weight: 105.1 kg (231 lb 11.2 oz)       Palliative Assessment/Data: 50%      Patient Active Problem List   Diagnosis Date Noted  . Dehydration   . Dizziness   . Goals of care, counseling/discussion 10/23/2017  . Malignant neoplasm of pancreas ([place]) 10/22/2017  . Hypercalcemia 10/22/2017  . Multiple lesions of metastatic malignancy ([place]) 10/16/2017  . Generalized abdominal pain   . Omental mass   . Pancreatic mass   . Hypothyroidism 10/15/2017  . Diabetes ([place]) 10/15/2017  . Hypertension 10/05/2017  . Deep vein thrombosis (DVT) of right lower extremity ([place]) 09/26/2017  . Personal history of peptic ulcer disease   . Gastritis with hemorrhage   . Symptomatic anemia   . Heme + stool   . Acute GI bleeding   . GI bleed 09/07/2017    Palliative Care Assessment & Plan   Patient Profile:  Ann Larson a43 y.o.femalewith a known history of diabetes mellitus type 2, GERD, hypercalcemia, hyperlipidemia, hypertension, recently diagnosed pancreatic cancer on chemotherapy presented to the emergency room with generalized weakness. Patient had oncology doctor appointment but felt very weak.She had a lot of fatigue and tiredness.Hence came to the emergency room hemoglobin is around 6.4.   Assessment/Recommendations/Plan: Neuropathic pain improving, Neurontin increased to 200mg  BID.  Continue to increase daily as needed for pain. Monitor for side effects.  Oncology has discussed [place] with patient as  well as palliative. Continue agressive care.     Code Status:    Code Status Orders  (From admission, onward)        Start     Ordered   10/29/17 1131  Full code  Continuous     10/29/17 1130    Code Status History    Date Active Date Inactive Code Status Order ID Comments User Context   10/16/2017 00:26 10/22/2017 20:38 Full Code [id number]  Lance Coon, MD ED   09/07/2017 16:09 09/09/2017 16:37 Full Code [id number]  Hillary Bow, MD ED       Prognosis:   Poor, metastatic cancer.   Discharge Planning:  To Be Determined  Care plan was discussed with Dr. Gillermina Phy  Thank you for allowing the Palliative Medicine Team to assist in the care of this patient.   Total Time 25min Prolonged Time Billed  No      Greater than 50%  of this time was spent counseling and coordinating care related to the above assessment and plan.  Asencion Gowda, NP  Please contact Palliative Medicine Team phone at [phone] for questions and concerns.

## 2017-11-01 NOTE — Progress Notes (Signed)
Report given to [facility], RN on 1C. Patient transferring with no cardiac monitoring. CCMD notified. Patient and family updated on plan of care. Ann Larson

## 2017-11-01 NOTE — Progress Notes (Signed)
Chaplain followed up with pattive care Pt. She was more attentive but seems family lack of appreciation was a concern. Ch. Present a prayer shawl and th  Pt was very thankful. I believe it was very important to her.    11/01/17 1000  Clinical Encounter Type  Visited With Patient;Family  Visit Type Follow-up  Referral From Chaplain  Spiritual Encounters  Spiritual Needs Prayer;Emotional

## 2017-11-01 NOTE — Evaluation (Addendum)
Physical Therapy Evaluation Patient Details Name: Ann Larson MRN: [id number] DOB: 06/25/1956 Today's Date: 11/01/2017   History of Present Illness  presented to ER secondary to generalized weakness; admitted with symptomatic anemia secondary to GIB.  Of note, patient with recently diagnosed pancreatic cancer with mets to liver and lungs; has undergone first round of chemo.  Bilat LEs noted with extensive DVTs, IVC filter in place; cleared for participation as tolerated by attending physician Tressia Miners)  Clinical Impression  Upon evalulation, patient alert and oriented to basic information; follows commands, but demonstrates mild confusion throughout session. L LE generally edematous with associated limitations in strength and ROM.  Currently requiring min assist for bed mobility; mod assist +2 for sit/stand, basic transfers and very short-distance gait.  Limited stance time/weight acceptance L LE; poor standing balance and overall activity tolerance.  High fall risk; unsafe to attempt without RW and +1-2 assist.   Unable to negotiate stairs required to enter/exit home at this time. Would benefit from skilled PT to address above deficits and promote optimal return to PLOF; recommend transition to STR upon discharge from acute hospitalization.     Follow Up Recommendations SNF    Equipment Recommendations  Wheelchair (measurements PT);Wheelchair cushion (measurements PT);3in1 (PT)    Recommendations for Other Services       Precautions / Restrictions Precautions Precautions: Fall Restrictions Weight Bearing Restrictions: No      Mobility  Bed Mobility Overal bed mobility: Needs Assistance Bed Mobility: Supine to Sit     Supine to sit: Min assist        Transfers Overall transfer level: Needs assistance Equipment used: Rolling walker (2 wheeled) Transfers: Sit to/from Stand Sit to Stand: Mod assist;+2 physical assistance         General transfer comment:  requires elevated bed/seating surface and assist +2 for lift off; step by step cuing for hand placement and sequencing  Ambulation/Gait Ambulation/Gait assistance: Mod assist;+2 safety/equipment Ambulation Distance (Feet): 5 Feet         General Gait Details: broad BOS with decreased weight acceptance/stance time L LE; poor balance and overall activity tolerance. Unsafe to attempt without RW and +2 assist  Stairs            Wheelchair Mobility    Modified Rankin (Stroke Patients Only)       Balance Overall balance assessment: Needs assistance Sitting-balance support: No upper extremity supported;Feet supported Sitting balance-Leahy Scale: Good     Standing balance support: Bilateral upper extremity supported Standing balance-Leahy Scale: Poor                               Pertinent Vitals/Pain Pain Assessment: No/denies pain    Home Living Family/patient expects to be discharged to:: Private residence Living Arrangements: Spouse/significant other(husband works outside of home) Available Help at Discharge: Family;Available PRN/intermittently Type of Home: Mobile home Home Access: Stairs to enter Entrance Stairs-Rails: Can reach both;Left;Right Entrance Stairs-Number of Steps: 3   Home Equipment: None      Prior Function Level of Independence: Independent         Comments: Indep with ADLs, household and community mobility without assist device; denies fall history.     Hand Dominance        Extremity/Trunk Assessment   Upper Extremity Assessment Upper Extremity Assessment: (grossly at least 4/5 throughout)    Lower Extremity Assessment Lower Extremity Assessment: Generalized weakness(R LE at least 4/5, L LE 3-/5  throughout.  L LE generally edematous with associated restrictions in ROM)       Communication   Communication: No difficulties  Cognition Arousal/Alertness: Awake/alert Behavior During Therapy: WFL for tasks  assessed/performed                                   General Comments: mild confusion evident at times      General Comments      Exercises Other Exercises Other Exercises: Educated in positioning and HEP (ankle pumps, LAQs) for use outside of therapy; patient voiced understanding   Assessment/Plan    PT Assessment Patient needs continued PT services  PT Problem List Decreased strength;Decreased activity tolerance;Decreased balance;Decreased mobility;Obesity;Pain;Decreased range of motion;Decreased cognition;Decreased knowledge of use of DME;Decreased knowledge of precautions;Decreased safety awareness       PT Treatment Interventions DME instruction;Gait training;Stair training;Functional mobility training;Therapeutic activities;Therapeutic exercise;Balance training;Patient/family education    PT Goals (Current goals can be found in the Care Plan section)  Acute Rehab PT Goals Patient Stated Goal: to get strength back PT Goal Formulation: With patient Time For Goal Achievement: 11/15/17 Potential to Achieve Goals: Fair    Frequency Min 2X/week   Barriers to discharge Inaccessible home environment;Decreased caregiver support      Co-evaluation               AM-PAC PT "6 Clicks" Daily Activity  Outcome Measure Difficulty turning over in bed (including adjusting bedclothes, sheets and blankets)?: Unable Difficulty moving from lying on back to sitting on the side of the bed? : Unable Difficulty sitting down on and standing up from a chair with arms (e.g., wheelchair, bedside commode, etc,.)?: Unable Help needed moving to and from a bed to chair (including a wheelchair)?: A Lot Help needed walking in hospital room?: A Lot Help needed climbing 3-5 steps with a railing? : Total 6 Click Score: 8    End of Session Equipment Utilized During Treatment: Gait belt Activity Tolerance: Patient tolerated treatment well;Patient limited by fatigue Patient left:  in bed;with call bell/phone within reach;with family/visitor present(pad in place and connected; RN to change batteries and activate as appropriate) Nurse Communication: Mobility status PT Visit Diagnosis: Unsteadiness on feet (R26.81);Difficulty in walking, not elsewhere classified (R26.2);Muscle weakness (generalized) (M62.81)    Time: 8841-6606 PT Time Calculation (min) (ACUTE ONLY): 27 min   Charges:   PT Evaluation $PT Eval Moderate Complexity: 1 Mod PT Treatments $Therapeutic Exercise: 8-22 mins   PT G Codes:       Numa Schroeter H. Owens Shark, PT, DPT, NCS 11/01/17, 9:49 PM [phone]

## 2017-11-01 NOTE — Progress Notes (Signed)
Pt received to 106 from ccu. A/l no distress. Left leg  Elevated on pillows   Cont reddened and swollen/ tight.  Pink foam to bottom. Rt upper  Single lumen  picc site intact / clean and dry.  Connected to [place] per nt . Oriented to room and controls husband at bedside

## 2017-11-01 NOTE — Progress Notes (Signed)
PHARMACIST PAIN CONSULT   SAIGE CANTON is an 62 y.o. female who presented to [facility] on 10/29/2017 with a chief complaint of GI Bleed.   Chief Complaint  Patient presents with  . Weakness   .   Past Medical History:  Diagnosis Date  . Depression   . Diabetes mellitus without complication ([place])   . Diverticulitis   . DVT (deep vein thrombosis) in pregnancy ([place]) 08/29/2017   Right popliteal and femoral  . GERD (gastroesophageal reflux disease)   . Hypercalcemia 10/22/2017  . Hyperlipidemia   . Hypertension   . Hypothyroidism   . Obesity   . Pancreatic cancer ([place]) 10/22/2017    Cancer Diagnosis: metastatic pancreatic cancer  Opioid Naive: no  Home Pain Medications: Oxycodone 5mg  Q4hr prn and Oxycontin 15mg  Q12hr.  Home Morphine Equivalents/24 hours: 90  Baseline Pain Score at Home:  Will need to interview patient.   Assessment: Patient's course has   Date Pain Score   Pain Goal in Hospital: will need to interview patient.   Patient's pain has been moderately controled on 3/13, but has had pain of 8 since leaving the ICU. If remains high, will need to adjust current regimen which is providing approximately up to 238 Morphine equivalents.   Plan:   Continuous: Fentanyl 64mcg Patch  Mild: APAP Moderate & Severe: Hycet 7.5/325 (44mL) q3hr prn and hydromorphone 1mg  q2hr prn Constipation: senna-docusate 1 tab BID. Last BM 2/9; will increase to senna-docusate 2 tab BID.   Pharmacist will continue to monitor patient's pain scores and bowel movements and adjust therapies as needed.   Charlett Nose [facility] Clinical Pharmacist  11/01/2017 2:53 PM

## 2017-11-01 NOTE — Progress Notes (Signed)
  [place] [facility] Progess Note    SYNOPSIS   Patient with metastatic pancreatic cancer to liver and lungs, presented with hypotension, gastrointestinal bleeding, swollen left leg with prior history of DVT/pulmonary emboli, status post filter placement, unable to anticoagulate secondary to recurrent bleeding  ASSESSMENT/PLAN   Shock. Patient is presently off of pressors.Doing much better. Adequate pain control. No evidence of active bleeding GI bleed. No further evidence of bleeding  DVT. Prior history of deep venous thrombosis, status post IVC filter, improved left lower extremity welling  Renal failure. Improved, multifactorial etiology to include volume contraction, hypotension with ATN  Metastatic pancreatic cancer Oncology consultation  Hyperglycemia. On scale coverage  At this point patient is doing significantly better, stable for floor transfer    INTAKE / OUTPUT:  Intake/Output Summary (Last 24 hours) at 11/01/2017 0756 Last data filed at 11/01/2017 0400 Gross per 24 hour  Intake 1879.67 ml  Output 450 ml  Net 1429.67 ml    Name: Ann Larson MRN: [id number] DOB: [birth year]    ADMISSION DATE:  10/29/2017  SUBJECTIVE:   Patient still on low-dose norepinephrine. States that her leg has improved. She states she has adequate pain control  VITAL SIGNS: Temp:  [97.6 F (36.4 C)-98.9 F (37.2 C)] 98 F (36.7 C) (03/14 0730) Pulse Rate:  [85-101] 91 (03/14 0730) Resp:  [14-26] 21 (03/14 0730) BP: (84-112)/(44-76) 96/57 (03/14 0700) SpO2:  [91 %-99 %] 97 % (03/14 0730)  PhYSICAL EXAMINATION: Physical Examination:   VS: BP (!) 96/57   Pulse 91   Temp 98 F (36.7 C) (Oral)   Resp (!) 21   Ht 5\' 9"  (1.753 m)   Wt 105.1 kg (231 lb 11.2 oz)   SpO2 97%   BMI 34.22 kg/m   General Appearance: No distress  Neuro:without focal findings, mental status normal. Pulmonary: normal breath sounds   CardiovascularNormal S1,S2.  No m/r/g.     Abdomen: Benign, Soft, non-tender. Extremities: improved left lower extremity swelling    LABORATORY PANEL:   CBC Recent Labs  Lab 11/01/17 0510  WBC 6.7  HGB 8.2*  HCT 25.6*  PLT 85*    Chemistries  Recent Labs  Lab 10/29/17 2236  10/31/17 0817  NA 136   < > 138  K 4.1   < > 3.8  CL 108   < > 112*  CO2 19*   < > 19*  GLUCOSE 164*   < > 297*  BUN 51*   < > 38*  CREATININE 2.58*   < > 1.44*  CALCIUM 8.4*   < > 7.7*  MG 2.1  --   --   PHOS 3.2  --   --   AST 32  --   --   ALT 29  --   --   ALKPHOS 91  --   --   BILITOT 2.2*  --   --    < > = values in this interval not displayed.    Recent Labs  Lab 10/30/17 2146 10/31/17 0731 10/31/17 1121 10/31/17 1617 10/31/17 2049 11/01/17 0730  GLUCAP 226* 250* 253* 197* 155* 203*   No results for input(s): PHART, PCO2ART, PO2ART in the last 168 hours. Recent Labs  Lab 10/29/17 2236  AST 32  ALT 29  ALKPHOS 91  BILITOT 2.2*  ALBUMIN 2.1*    Cardiac Enzymes Recent Labs  Lab 10/30/17 1043  TROPONINI <0.03    RADIOLOGY:  No results found. Hermelinda Dellen, DO 11/01/2017

## 2017-11-01 NOTE — Evaluation (Signed)
Clinical/Bedside Swallow Evaluation Patient Details  Name: Ann Larson MRN: [id number] Date of Birth: [birth year]  Today's Date: 11/01/2017 Time: SLP Start Time (ACUTE ONLY): 1200 SLP Stop Time (ACUTE ONLY): 1300 SLP Time Calculation (min) (ACUTE ONLY): 60 min  Past Medical History:  Past Medical History:  Diagnosis Date  . Depression   . Diabetes mellitus without complication ([place])   . Diverticulitis   . DVT (deep vein thrombosis) in pregnancy ([place]) 08/29/2017   Right popliteal and femoral  . GERD (gastroesophageal reflux disease)   . Hypercalcemia 10/22/2017  . Hyperlipidemia   . Hypertension   . Hypothyroidism   . Obesity   . Pancreatic cancer ([place]) 10/22/2017   Past Surgical History:  Past Surgical History:  Procedure Laterality Date  . ABDOMINAL HYSTERECTOMY  2005  . CHOLECYSTECTOMY  2015  . COLONOSCOPY WITH PROPOFOL N/A 07/01/2015   Procedure: COLONOSCOPY WITH PROPOFOL;  Surgeon: Josefine Class, MD;  Location: [facility] ENDOSCOPY;  Service: Endoscopy;  Laterality: N/A;  . ESOPHAGOGASTRODUODENOSCOPY (EGD) WITH PROPOFOL N/A 07/01/2015   Procedure: ESOPHAGOGASTRODUODENOSCOPY (EGD) WITH PROPOFOL;  Surgeon: Josefine Class, MD;  Location: [facility] ENDOSCOPY;  Service: Endoscopy;  Laterality: N/A;  . ESOPHAGOGASTRODUODENOSCOPY (EGD) WITH PROPOFOL N/A 09/08/2017   Procedure: ESOPHAGOGASTRODUODENOSCOPY (EGD) WITH PROPOFOL;  Surgeon: Lucilla Lame, MD;  Location: [facility] ENDOSCOPY;  Service: Endoscopy;  Laterality: N/A;  . ESOPHAGOGASTRODUODENOSCOPY (EGD) WITH PROPOFOL N/A 09/21/2017   Procedure: ESOPHAGOGASTRODUODENOSCOPY (EGD) WITH PROPOFOL;  Surgeon: Lucilla Lame, MD;  Location: [facility] ENDOSCOPY;  Service: Endoscopy;  Laterality: N/A;  . ESOPHAGOGASTRODUODENOSCOPY (EGD) WITH PROPOFOL N/A 10/18/2017   Procedure: ESOPHAGOGASTRODUODENOSCOPY (EGD) WITH PROPOFOL;  Surgeon: [name], MD;  Location: [facility] ENDOSCOPY;  Service: Gastroenterology;  Laterality: N/A;  . IVC FILTER INSERTION N/A  09/08/2017   Procedure: IVC FILTER INSERTION;  Surgeon: Algernon Huxley, MD;  Location: [place] CV LAB;  Service: Cardiovascular;  Laterality: N/A;   HPI:  Pt is a 62 yo female who is known to Oncology service due to newly found metastatic pancreatic cancer to liver and lungs, hemorraghic gastritis, GERD, Obesity, hypothyroidism, depression, diverticulitis, w/ bilateral massive lower extremity acute DVT presented to ED due to weakness and near syncope. She was found to be anemic with hemoglobin of 6.6 and in acute kidney failure, and also hypotension. Admitted in ICU. S/p 2 units of PRBCs transfusion. Recent EGD showed Gastritis with hemorrhage, treated with argon plasma coagulation.    Assessment / Plan / Recommendation Clinical Impression  Pt appears to present w/ Mild oropharyngeal phase dysphagia c/b inconsistent delay in initiation of A-P transfer of liquid boluses w/ hesitancy to initiate the swallow. This seemed more pronounced when pt was "thinking" about it more and when being watched. When pt took 2 trials w/ less monitoring, the oropharyngeal phases of swallowing was adequate and timely. It appeared that once pt relaxed and allowed the bolus material move posteriorly, the pharyngeal swallow was initiated more timely w/ less oral holding noted. Unsure of reason for pt's hesitancy in the transfer and swallow of liquids. This behavior was NOT noted w/ trial of puree. Pt declined she had "no trouble swallowing foods". Pt had initially c/o difficulty swallowing pills but is exhibiting more approprirate swallowing and clearing w/ Pills in Puree - crushed or broken into small pieces. Husband asked about other options such as puddings and ice cream; SLP agreed. Pt does have increased Esophageal irritation from the recent Gastritis which may be causing dysmotility thus impacting the pharyngeal phase initiation of swallowing. Unsure if this  behavior is related to pt's aggressive Cancer issues impacting  her Cognitive focus for swallowing task - increased textures(solids) may give more stimulation during swallowing.   SLP Visit Diagnosis: Dysphagia, oropharyngeal phase (R13.12)    Aspiration Risk  Mild aspiration risk(reduced following general aspiration precautions)    Diet Recommendation  Dysphagia level 3 [facility] SOFT) w/ Thin liquids; general aspiration precautions. Moistening foods.  Medication Administration: Crushed with puree(check w/ Pharmacy; liquid form)    Other  Recommendations Recommended Consults: Consider ENT evaluation;Consider GI evaluation(Dietician f/u) Oral Care Recommendations: Oral care BID;Patient independent with oral care   Follow up Recommendations (TBD)      Frequency and Duration min 2x/week  1 week       Prognosis Prognosis for Safe Diet Advancement: Good Barriers to Reach Goals: Behavior      Swallow Study   General Date of Onset: 10/29/17 HPI: Pt is a 62 yo female who is known to Oncology service due to newly found metastatic pancreatic cancer to liver and lungs, hemorraghic gastritis, GERD, Obesity, hypothyroidism, depression, diverticulitis, w/ bilateral massive lower extremity acute DVT presented to ED due to weakness and near syncope. She was found to be anemic with hemoglobin of 6.6 and in acute kidney failure, and also hypotension. Admitted in ICU. S/p 2 units of PRBCs transfusion. Recent EGD showed Gastritis with hemorrhage, treated with argon plasma coagulation.  Type of Study: Bedside Swallow Evaluation Previous Swallow Assessment: no - pt describes longstanding, ongoing difficulty swallowing Pills, and drinking water at times. Pt denied any difficulty swallowing foods.   Diet Prior to this Study: Regular;Thin liquids(GI SOFT diet) Temperature Spikes Noted: No(wbc 6.7) Respiratory Status: Room air History of Recent Intubation: No Behavior/Cognition: Alert;Cooperative;Pleasant mood(family present) Oral Cavity Assessment: Within Functional  Limits Oral Care Completed by SLP: Recent completion by staff Oral Cavity - Dentition: Adequate natural dentition Vision: Functional for self-feeding Self-Feeding Abilities: Able to feed self;Needs set up(min) Patient Positioning: Upright in bed(needed more positioning upright) Baseline Vocal Quality: Normal Volitional Cough: Strong Volitional Swallow: Able to elicit    Oral/Motor/Sensory Function Overall Oral Motor/Sensory Function: Within functional limits   Ice Chips Ice chips: Not tested   Thin Liquid Thin Liquid: Impaired(intermittent) Presentation: Self Fed;Straw(7 trials) Oral Phase Impairments: (oral holding; dec. coordination to transfer) Oral Phase Functional Implications: Oral holding Pharyngeal  Phase Impairments: (appeared to have hesitation to swallow) Other Comments: no overt s/s of aspiration noted    Nectar Thick Nectar Thick Liquid: Not tested   Honey Thick Honey Thick Liquid: Not tested   Puree Puree: Within functional limits Presentation: Self Fed;Spoon(1 trial accepted) Other Comments: no other trials accepted by pt   Solid   GO   Solid: Not tested Other Comments: declined po trials         Orinda Kenner, MS, CCC-SLP Darnita Woodrum 11/01/2017,4:42 PM

## 2017-11-01 NOTE — Progress Notes (Signed)
Ann Larson at [place] NAME: Ann Larson    MR#:  [id number]  DATE OF BIRTH:  Dec 26, 1955  SUBJECTIVE:  CHIEF COMPLAINT:   Chief Complaint  Patient presents with  . Weakness   - hemoglobin is stable. Off Levophed. -Feels about the same  REVIEW OF SYSTEMS:  Review of Systems  Constitutional: Positive for malaise/fatigue. Negative for chills and fever.  HENT: Negative for ear discharge, hearing loss and nosebleeds.   Eyes: Negative for blurred vision and double vision.  Respiratory: Negative for cough, shortness of breath and wheezing.   Cardiovascular: Positive for leg swelling. Negative for chest pain and palpitations.  Gastrointestinal: Negative for abdominal pain, constipation, diarrhea, nausea and vomiting.  Genitourinary: Negative for dysuria.  Musculoskeletal: Positive for myalgias.  Neurological: Negative for dizziness, speech change, focal weakness, seizures and headaches.  Psychiatric/Behavioral: Negative for depression.    DRUG ALLERGIES:   Allergies  Allergen Reactions  . Sulfa Antibiotics     VITALS:  Blood pressure 118/67, pulse 100, temperature 98 F (36.7 C), temperature source Oral, resp. rate (!) 22, height 5\' 9"  (1.753 m), weight 105.1 kg (231 lb 11.2 oz), SpO2 99 %.  PHYSICAL EXAMINATION:  Physical Exam  GENERAL:  61 y.o.-year-old patient lying in the bed with no acute distress.  EYES: Pupils equal, round, reactive to light and accommodation. No scleral icterus. Extraocular muscles intact.  HEENT: Head atraumatic, normocephalic. Oropharynx and nasopharynx clear.  NECK:  Supple, no jugular venous distention. No thyroid enlargement, no tenderness.  LUNGS: Normal breath sounds bilaterally, no wheezing, rales,rhonchi or crepitation. No use of accessory muscles of respiration. Decreased bibasilar breath sounds CARDIOVASCULAR: S1, S2 normal. No murmurs, rubs, or gallops.  ABDOMEN: Soft, nontender,  nondistended. Bowel sounds present. No organomegaly or mass.  EXTREMITIES: No  cyanosis, or clubbing. 2+ right leg edema and 3+ left leg swelling with erythema and calf tenderness NEUROLOGIC: Cranial nerves II through XII are intact. Muscle strength 5/5 in all extremities. Sensation intact. Gait not checked. Global weakness noted. PSYCHIATRIC: The patient is alert and oriented x 3.  SKIN: No obvious rash, lesion, or ulcer.    LABORATORY PANEL:   CBC Recent Labs  Lab 11/01/17 0510  WBC 6.7  HGB 8.2*  HCT 25.6*  PLT 85*   ------------------------------------------------------------------------------------------------------------------  Chemistries  Recent Labs  Lab 10/29/17 2236  10/31/17 0817  NA 136   < > 138  K 4.1   < > 3.8  CL 108   < > 112*  CO2 19*   < > 19*  GLUCOSE 164*   < > 297*  BUN 51*   < > 38*  CREATININE 2.58*   < > 1.44*  CALCIUM 8.4*   < > 7.7*  MG 2.1  --   --   AST 32  --   --   ALT 29  --   --   ALKPHOS 91  --   --   BILITOT 2.2*  --   --    < > = values in this interval not displayed.   ------------------------------------------------------------------------------------------------------------------  Cardiac Enzymes Recent Labs  Lab 10/30/17 1043  TROPONINI <0.03   ------------------------------------------------------------------------------------------------------------------  RADIOLOGY:  No results found.  EKG:   Orders placed or performed during the hospital encounter of 10/29/17  . ED EKG  . ED EKG  . EKG    ASSESSMENT AND PLAN:    62 year old female with past medical history significant for recent diagnosis of metastatic  pancreatic cancer with DVT, diabetes, hypertension and hypercalcemia presents to hospital secondary to weakness and noted to have anemia with hemoglobin of 6.4  1. Acute on chronic anemia-recent admissions in the last couple of months with anemia secondary to GI bleed status post argon beam coagulation of  bleeding gastric mucosa 3 times in the last 2 months. -Could be either GI bleed or from her recent chemotherapy -No active bleeding at this time. -Not on any anticoagulation. Status post blood transfusion and hemoglobin around 8. Transfusion if hemoglobin is less than 7. -Continue to monitor at this time. On IV protonix-page to oral Protonix -advanced diet as GI recommended, no plans for any procedure  2.hypotension-secondary to hypovolemia and hemorrhagic shock -Status post transfusion. On stress dose steroids -off Levophed.   3. Bilateral DVT-with significant leg pain, started on fentanyl patch. -Neurontin has been added -Ultrasound 2 weeks ago showing extensive occlusive bilateral lower extremity DVT. -Secondary to her pancreatic cancer. Patient is not a candidate for anticoagulation secondary to significant GI bleed on Xarelto. -Also has underlying anemia. She is status post IVC filter placement- -some cellulitis of the leg noted as well. On Rocephin  4. ARF- ATN from anemia and hypertension. Improving with transfusion and fluids. Creatinine close to baseline -Avoid nephrotoxins  5. Metastatic pancreatic cancer-status post first dose of chemotherapy last week. Oncology consult recommended. Overall poor prognosis -patient wants aggressive care  6. DVT prophylaxis-Ted's if she can tolerate   appreciate palliative care consult -we'll get physical therapy consult once transferred to floor   All the records are reviewed and case discussed with Care Management/Social Workerr. Management plans discussed with the patient, family and they are in agreement.  CODE STATUS: Full Code  TOTAL TIME TAKING CARE OF THIS PATIENT: 36 minutes.   POSSIBLE D/C IN  2-3 DAYS, DEPENDING ON CLINICAL CONDITION.   Gladstone Lighter M.D on 11/01/2017 at 10:37 AM  Between 7am to 6pm - Pager - [phone]  After 6pm go to [email or web address] - password [email or web address] [place] [facility]  Office   [phone]  CC: Primary care physician; Lorelee Market, MD

## 2017-11-02 LAB — CBC
HEMATOCRIT: 26.2 % — AB (ref 35.0–47.0)
HEMOGLOBIN: 8.4 g/dL — AB (ref 12.0–16.0)
MCH: 28.8 pg (ref 26.0–34.0)
MCHC: 32.2 g/dL (ref 32.0–36.0)
MCV: 89.4 fL (ref 80.0–100.0)
Platelets: 71 10*3/uL — ABNORMAL LOW (ref 150–440)
RBC: 2.93 MIL/uL — AB (ref 3.80–5.20)
RDW: 18 % — ABNORMAL HIGH (ref 11.5–14.5)
WBC: 8.4 10*3/uL (ref 3.6–11.0)

## 2017-11-02 LAB — GLUCOSE, CAPILLARY
GLUCOSE-CAPILLARY: 186 mg/dL — AB (ref 65–99)
Glucose-Capillary: 175 mg/dL — ABNORMAL HIGH (ref 65–99)
Glucose-Capillary: 263 mg/dL — ABNORMAL HIGH (ref 65–99)
Glucose-Capillary: 210 mg/dL — ABNORMAL HIGH (ref 65–99)

## 2017-11-02 MED ORDER — VENLAFAXINE HCL 25 MG PO TABS
25.0000 mg | ORAL_TABLET | Freq: Three times a day (TID) | ORAL | Status: DC
  Administered 2017-11-02 – 2017-11-03 (×2): 25 mg via ORAL
  Filled 2017-11-02 (×4): qty 1

## 2017-11-02 MED ORDER — SENNOSIDES 8.8 MG/5ML PO SYRP
10.0000 mL | ORAL_SOLUTION | Freq: Two times a day (BID) | ORAL | Status: DC
  Administered 2017-11-02 – 2017-11-03 (×3): 10 mL via ORAL
  Filled 2017-11-02 (×4): qty 10

## 2017-11-02 MED ORDER — DOCUSATE SODIUM 50 MG/5ML PO LIQD
100.0000 mg | Freq: Two times a day (BID) | ORAL | Status: DC
  Administered 2017-11-02 – 2017-11-03 (×3): 100 mg via ORAL
  Filled 2017-11-02 (×4): qty 10

## 2017-11-02 MED ORDER — VALPROATE SODIUM 250 MG/5ML PO SOLN
25.0000 mg | Freq: Three times a day (TID) | ORAL | Status: DC
  Filled 2017-11-02 (×3): qty 0.5

## 2017-11-02 MED ORDER — PANTOPRAZOLE SODIUM 40 MG IV SOLR
40.0000 mg | Freq: Two times a day (BID) | INTRAVENOUS | Status: DC
  Administered 2017-11-02 – 2017-11-03 (×3): 40 mg via INTRAVENOUS
  Filled 2017-11-02 (×3): qty 40

## 2017-11-02 MED ORDER — AMOXICILLIN-POT CLAVULANATE 400-57 MG/5ML PO SUSR
800.0000 mg | Freq: Two times a day (BID) | ORAL | Status: DC
  Administered 2017-11-02 – 2017-11-03 (×2): 800 mg via ORAL
  Filled 2017-11-02 (×3): qty 10

## 2017-11-02 MED ORDER — MAGIC MOUTHWASH W/LIDOCAINE
5.0000 mL | Freq: Three times a day (TID) | ORAL | Status: DC | PRN
  Filled 2017-11-02: qty 5

## 2017-11-02 MED ORDER — GABAPENTIN 250 MG/5ML PO SOLN
200.0000 mg | Freq: Two times a day (BID) | ORAL | Status: DC
  Administered 2017-11-02 – 2017-11-03 (×3): 200 mg via ORAL
  Filled 2017-11-02 (×4): qty 4

## 2017-11-02 MED ORDER — LEVOTHYROXINE SODIUM 100 MCG IV SOLR
62.5000 ug | Freq: Every day | INTRAVENOUS | Status: DC
  Administered 2017-11-03: 62.5 ug via INTRAVENOUS
  Filled 2017-11-02: qty 5

## 2017-11-02 MED ORDER — MAGIC MOUTHWASH: 5.0000 mL | Freq: Three times a day (TID) | ORAL | Status: DC | PRN

## 2017-11-02 MED ORDER — LIDOCAINE VISCOUS 2 % MT SOLN
5.0000 mL | Freq: Three times a day (TID) | OROMUCOSAL | Status: DC | PRN
  Administered 2017-11-02: 5 mL via OROMUCOSAL
  Filled 2017-11-02 (×2): qty 15

## 2017-11-02 MED ORDER — FERROUS SULFATE 220 (44 FE) MG/5ML PO ELIX
300.0000 mg | ORAL_SOLUTION | Freq: Two times a day (BID) | ORAL | Status: DC
  Administered 2017-11-02 – 2017-11-03 (×2): 300 mg via ORAL
  Filled 2017-11-02 (×3): qty 6.9

## 2017-11-02 MED ORDER — FERROUS SULFATE 300 (60 FE) MG/5ML PO SYRP
300.0000 mg | ORAL_SOLUTION | Freq: Two times a day (BID) | ORAL | Status: DC
  Filled 2017-11-02: qty 5

## 2017-11-02 NOTE — Care Management Note (Signed)
Case Management Note  Patient Details  Name: Ann Larson MRN: [id number] Date of Birth: August 05, 1956  Subjective/Objective:    Admitted to [facility] with the diagnosis of GI bleed. Lives with husband, [name] [phone]). Seen Dr. Brunetta Genera 2 weeks ago. Prescriptions are filled at [facility]. States that [facility] hasn't had the opportunity to come to her home. No skilled nursing. No home oxygen. Wheelchair and rolling walker in the home.  Takes care of all basic activities of daily living herself, can drive if needed. No falls. Appetite is better. PICC placed 10/26/17. Started 1st round of chemotherapy.               Action/Plan: Will continue to follow  Expected Discharge Date:                  Expected Discharge Plan:     In-House Referral:     Discharge planning Services  CM Consult  Post Acute Care Choice:  Home Health Choice offered to:     DME Arranged:    DME Agency:     HH Arranged:    [place] Agency:  [facility]  Status of Service:  In process, will continue to follow  If discussed at Long Length of Stay Meetings, dates discussed:    Additional Comments:  Shelbie Ammons, RN 11/02/2017, 11:24 AM

## 2017-11-02 NOTE — Clinical Social Work Note (Signed)
Clinical Social Work Assessment  Patient Details  Name: Ann Larson MRN: [id number] Date of Birth: December 08, 1955  Date of referral:  11/02/17               Reason for consult:  Facility Placement                Permission sought to share information with:    Permission granted to share information::     Name::        Agency::     Relationship::     Contact Information:     Housing/Transportation Living arrangements for the past 2 months:  Single Family Home Source of Information:  Patient Patient Interpreter Needed:  None Criminal Activity/Legal Involvement Pertinent to Current Situation/Hospitalization:  No - Comment as needed Significant Relationships:  Spouse, Siblings Lives with:  Siblings, Spouse Do you feel safe going back to the place where you live?  Yes Need for family participation in patient care:  Yes (Comment)  Care giving concerns:  Patient lives in [place] with her husband Ann Larson and sister Ann Larson.    Social Worker assessment / plan:  Holiday representative (CSW) received SNF consult. PT is recommending SNF. CSW met with patient and her sister Ann Larson was at bedside. CSW introduced self and explained role of CSW department. Patient was alert and oriented X4 and was laying in the bed. Patient reported that she lives with her sister and husband. Patient reported that she started chemo at [facility] center and will go once a week. Patient reported that she will drive herself or her husband will transport to the cancer center. CSW explained SNF process. Patient refused SNF and stated that she will go home. Patient is agreeable to home health. RN case manager aware of above. CSW will continue to follow and assist as needed.   Employment status:  Disabled (Comment on whether or not currently receiving Disability), Retired Forensic scientist:  Managed Care PT Recommendations:  [place] / Referral to community resources:  Other (Comment  Required)(Patient refused SNF and wants home health. )  Patient/Family's Response to care:  Patient refused SNF.   Patient/Family's Understanding of and Emotional Response to Diagnosis, Current Treatment, and Prognosis:  Patient was very pleasant and thanked CSW for assistance.   Emotional Assessment Appearance:  Appears stated age Attitude/Demeanor/Rapport:    Affect (typically observed):  Accepting, Adaptable, Pleasant Orientation:  Oriented to Self, Oriented to Place, Oriented to  Time, Oriented to Situation Alcohol / Substance use:  Not Applicable Psych involvement (Current and /or in the community):  No (Comment)  Discharge Needs  Concerns to be addressed:  Discharge Planning Concerns Readmission within the last 30 days:  No Current discharge risk:  Dependent with Mobility Barriers to Discharge:  Continued Medical Work up   [employer], Ann Beets, Ann Larson 11/02/2017, 4:54 PM

## 2017-11-02 NOTE — Progress Notes (Signed)
[place] [facility] [place] NAME: Ann Larson    MR#:  [id number]  DATE OF BIRTH:  September 07, 1955  SUBJECTIVE:  CHIEF COMPLAINT:   Chief Complaint  Patient presents with  . Weakness   - hemoglobin is stable. Off Levophed. -Feels about the same  REVIEW OF SYSTEMS:  Review of Systems  Constitutional: Positive for malaise/fatigue. Negative for chills and fever.  HENT: Negative for ear discharge, hearing loss and nosebleeds.   Eyes: Negative for blurred vision and double vision.  Respiratory: Negative for cough, shortness of breath and wheezing.   Cardiovascular: Positive for leg swelling. Negative for chest pain and palpitations.  Gastrointestinal: Negative for abdominal pain, constipation, diarrhea, nausea and vomiting.  Genitourinary: Negative for dysuria.  Musculoskeletal: Positive for myalgias.  Neurological: Negative for dizziness, speech change, focal weakness, seizures and headaches.  Psychiatric/Behavioral: Negative for depression.    DRUG ALLERGIES:   Allergies  Allergen Reactions  . Sulfa Antibiotics     VITALS:  Blood pressure 119/63, pulse 84, temperature 98.1 F (36.7 C), temperature source Oral, resp. rate (!) 22, height 5\' 9"  (1.753 m), weight 105.1 kg (231 lb 11.2 oz), SpO2 99 %.  PHYSICAL EXAMINATION:  Physical Exam  GENERAL:  62 y.o.-year-old patient lying in the bed with no acute distress.  EYES: Pupils equal, round, reactive to light and accommodation. No scleral icterus. Extraocular muscles intact.  HEENT: Head atraumatic, normocephalic. Oropharynx and nasopharynx clear. Dry tongue and oral thrush noted. NECK:  Supple, no jugular venous distention. No thyroid enlargement, no tenderness.  LUNGS: Normal breath sounds bilaterally, no wheezing, rales,rhonchi or crepitation. No use of accessory muscles of respiration. Decreased bibasilar breath sounds CARDIOVASCULAR: S1, S2 normal. No murmurs, rubs, or gallops.    ABDOMEN: Soft, nontender, nondistended. Bowel sounds present. No organomegaly or mass.  EXTREMITIES: No  cyanosis, or clubbing. 2+ right leg edema and 3+ left leg swelling with erythema and calf tenderness NEUROLOGIC: Cranial nerves II through XII are intact. Muscle strength 5/5 in all extremities. Sensation intact. Gait not checked. Global weakness noted. PSYCHIATRIC: The patient is alert and oriented x 3.  SKIN: No obvious rash, lesion, or ulcer.    LABORATORY PANEL:   CBC Recent Labs  Lab 11/02/17 0522  WBC 8.4  HGB 8.4*  HCT 26.2*  PLT 71*   ------------------------------------------------------------------------------------------------------------------  Chemistries  Recent Labs  Lab 10/29/17 2236  10/31/17 0817  NA 136   < > 138  K 4.1   < > 3.8  CL 108   < > 112*  CO2 19*   < > 19*  GLUCOSE 164*   < > 297*  BUN 51*   < > 38*  CREATININE 2.58*   < > 1.44*  CALCIUM 8.4*   < > 7.7*  MG 2.1  --   --   AST 32  --   --   ALT 29  --   --   ALKPHOS 91  --   --   BILITOT 2.2*  --   --    < > = values in this interval not displayed.   ------------------------------------------------------------------------------------------------------------------  Cardiac Enzymes Recent Labs  Lab 10/30/17 1043  TROPONINI <0.03   ------------------------------------------------------------------------------------------------------------------  RADIOLOGY:  No results found.  EKG:   Orders placed or performed during the hospital encounter of 10/29/17  . ED EKG  . ED EKG  . EKG    ASSESSMENT AND PLAN:    62 year old female with past medical history  significant for recent diagnosis of metastatic pancreatic cancer with DVT, diabetes, hypertension and hypercalcemia presents to hospital secondary to weakness and noted to have anemia with hemoglobin of 6.4  1. Acute on chronic anemia-recent admissions in the last couple of months with anemia secondary to GI bleed status post  argon beam coagulation of bleeding gastric mucosa 3 times in the last 2 months. -Could be either GI bleed or from her recent chemotherapy -No active bleeding at this time. -Not on any anticoagulation. Status post blood transfusion and hemoglobin around 8. Transfusion if hemoglobin is less than 7. -Continue to monitor at this time. On protonix -advanced diet as GI recommended, no plans for any procedure  2.hypotension-secondary to hypovolemia and hemorrhagic shock -Status post transfusion. Off stress dose steroids -off Levophed.  BP is better  3. Bilateral DVT-with significant leg pain, started on fentanyl patch. -Neurontin has been added -Ultrasound 2 weeks ago showing extensive occlusive bilateral lower extremity DVT. -Secondary to her pancreatic cancer. Patient is not a candidate for anticoagulation secondary to significant GI bleed on Xarelto. -Also has underlying anemia. She is status post IVC filter placement- -some cellulitis of the leg noted as well. On Rocephin  4. ARF- ATN from anemia and hypertension. Improving with transfusion and fluids. Creatinine close to baseline -Avoid nephrotoxins  5. Metastatic pancreatic cancer-status post first dose of chemotherapy last week. Oncology consult recommended. Overall poor prognosis -patient wants aggressive care  6. DVT prophylaxis-Ted's if she can tolerate   appreciate palliative care consult Physical Therapy consulted- plan to discharge home with home health tomorrow   All the records are reviewed and case discussed with Care Management/Social Workerr. Management plans discussed with the patient, family and they are in agreement.  CODE STATUS: Full Code  TOTAL TIME TAKING CARE OF THIS PATIENT: 36 minutes.   POSSIBLE D/C TOMORROW, DEPENDING ON CLINICAL CONDITION.   Gladstone Lighter M.D on 11/02/2017 at 1:53 PM  Between 7am to 6pm - Pager - [phone]  After 6pm go to [email or web address] - password [email or web address]  [facility]  Office  [phone]  CC: Primary care physician; Lorelee Market, MD

## 2017-11-02 NOTE — Progress Notes (Signed)
PHARMACIST PAIN CONSULT   Ann Larson is an 62 y.o. female who presented to [facility] on 10/29/2017 with a chief complaint of GI Bleed.   Chief Complaint  Patient presents with  . Weakness   .   Past Medical History:  Diagnosis Date  . Depression   . Diabetes mellitus without complication ([place])   . Diverticulitis   . DVT (deep vein thrombosis) in pregnancy ([place]) 08/29/2017   Right popliteal and femoral  . GERD (gastroesophageal reflux disease)   . Hypercalcemia 10/22/2017  . Hyperlipidemia   . Hypertension   . Hypothyroidism   . Obesity   . Pancreatic cancer ([place]) 10/22/2017    Cancer Diagnosis: metastatic pancreatic cancer  Opioid Naive: no  Home Pain Medications: Oxycodone 5mg  Q4hr prn and Oxycontin 15mg  Q12hr.  Home Morphine Equivalents/24 hours: 90  Baseline Pain Score at Home:  Will need to interview patient.   Assessment:  Patient's pain is well-controlled with 50 mcg/hr fentanyl patch providing 120 morphine equivalents daily. Only one dose of breakthrough hydromorphone used in the last 24 hours. Patient reports being mostly pain free but does complain of mouth irritation due to thrush and is concerned about difficulty with oral medications.  Plan:   Continuous: Fentanyl 15mcg Patch  Mild: APAP Moderate & Severe: Hycet 7.5/325 (47mL) q3hr prn and hydromorphone 1mg  q2hr prn  Will continue pain medications as ordered. Will add magic mouthwash with lidocaine tid prn to help with mouth pain. Will convert medications to liquid or IV as possible. Dicussed plan with patient and RN.   Pharmacist will continue to monitor patient's pain scores and bowel movements and adjust therapies as needed.   Napoleon Form, [facility] Clinical Pharmacist  11/02/2017 10:25 AM

## 2017-11-02 NOTE — NC FL2 (Signed)
[place] LEVEL OF CARE SCREENING TOOL     IDENTIFICATION  Patient Name: Ann Larson Birthdate: 1955/12/14 Sex: female Admission Date (Current Location): 10/29/2017  [place] and [place] Number:  Engineering geologist and Address:  [facility], [address] [place],  [address]      Provider Number: [id number]  Attending Physician Name and Address:  Gladstone Lighter, MD  Relative Name and Phone Number:       Current Level of Care: Hospital Recommended Level of Care: [place] Prior Approval Number:    Date Approved/Denied:   PASRR Number: [id number])  Discharge Plan: SNF    Current Diagnoses: Patient Active Problem List   Diagnosis Date Noted  . Dehydration   . Dizziness   . Goals of care, counseling/discussion 10/23/2017  . Malignant neoplasm of pancreas ([place]) 10/22/2017  . Hypercalcemia 10/22/2017  . Multiple lesions of metastatic malignancy ([place]) 10/16/2017  . Generalized abdominal pain   . Omental mass   . Pancreatic mass   . Hypothyroidism 10/15/2017  . Diabetes ([place]) 10/15/2017  . Hypertension 10/05/2017  . Deep vein thrombosis (DVT) of right lower extremity ([place]) 09/26/2017  . Personal history of peptic ulcer disease   . Gastritis with hemorrhage   . Symptomatic anemia   . Heme + stool   . Acute GI bleeding   . GI bleed 09/07/2017    Orientation RESPIRATION BLADDER Height & Weight     Self, Time, Situation, Place  Normal Continent Weight: 231 lb 11.2 oz (105.1 kg) Height:  5\' 9"  (175.3 cm)  BEHAVIORAL SYMPTOMS/MOOD NEUROLOGICAL BOWEL NUTRITION STATUS      Continent Diet(Diet: Soft. )  AMBULATORY STATUS COMMUNICATION OF NEEDS Skin   Extensive Assist Verbally Normal                       Personal Care Assistance Level of Assistance  Bathing, Feeding, Dressing Bathing Assistance: Limited assistance Feeding assistance: Independent Dressing Assistance: Limited  assistance     Functional Limitations Info  Sight, Hearing, Speech Sight Info: Adequate Hearing Info: Adequate Speech Info: Adequate    SPECIAL CARE FACTORS FREQUENCY  PT (By licensed PT), OT (By licensed OT)     PT Frequency: (5) OT Frequency: (5)            Contractures      Additional Factors Info  Code Status, Allergies Code Status Info: (Full Code. ) Allergies Info: (Sulfa Antibiotics)           Current Medications (11/02/2017):  This is the current hospital active medication list Current Facility-Administered Medications  Medication Dose Route Frequency Provider Last Rate Last Dose  . acetaminophen (TYLENOL) tablet 650 mg  650 mg Oral Q6H PRN Saundra Shelling, MD       Or  . acetaminophen (TYLENOL) suppository 650 mg  650 mg Rectal Q6H PRN Pyreddy, Reatha Harps, MD      . amoxicillin-clavulanate (AUGMENTIN) 400-57 MG/5ML suspension 800 mg  800 mg Oral Q12H Kalisetti, Hart Rochester, MD      . sennosides (SENOKOT) 8.8 MG/5ML syrup 10 mL  10 mL Oral BID Gladstone Lighter, MD   10 mL at 11/02/17 1101   And  . docusate (COLACE) 50 MG/5ML liquid 100 mg  100 mg Oral BID Gladstone Lighter, MD   100 mg at 11/02/17 1102  . fentaNYL (DURAGESIC - dosed mcg/hr) 50 mcg  50 mcg Transdermal Q72H Dorene Sorrow S, NP   50 mcg at 11/01/17  2241  . ferrous sulfate 220 (44 Fe) MG/5ML solution 300 mg  300 mg Oral BID WC Gladstone Lighter, MD      . gabapentin (NEURONTIN) 250 MG/5ML solution 200 mg  200 mg Oral Q12H Gladstone Lighter, MD   200 mg at 11/02/17 1102  . HYDROcodone-acetaminophen (HYCET) 7.5-325 mg/15 ml solution 15 mL  15 mL Oral Q3H PRN Dorene Sorrow S, NP   15 mL at 10/31/17 1633  . HYDROmorphone (DILAUDID) injection 1 mg  1 mg Intravenous Q2H PRN Dorene Sorrow S, NP   1 mg at 11/01/17 1208  . insulin aspart (novoLOG) injection 0-15 Units  0-15 Units Subcutaneous TID WC Conforti, John, DO   8 Units at 11/02/17 1205  . insulin aspart (novoLOG) injection 0-5 Units  0-5 Units  Subcutaneous QHS Conforti, John, DO      . [START ON 11/03/2017] levothyroxine (SYNTHROID, LEVOTHROID) injection 62.5 mcg  62.5 mcg Intravenous Daily Gladstone Lighter, MD      . magic mouthwash  5 mL Oral TID PRN Gladstone Lighter, MD       And  . lidocaine (XYLOCAINE) 2 % viscous mouth solution 5 mL  5 mL Mouth/Throat TID PRN Gladstone Lighter, MD   5 mL at 11/02/17 1102  . nystatin (MYCOSTATIN) 100000 UNIT/ML suspension 500,000 Units  5 mL Oral QID Gladstone Lighter, MD   500,000 Units at 11/02/17 1410  . ondansetron (ZOFRAN) tablet 4 mg  4 mg Oral Q6H PRN Saundra Shelling, MD       Or  . ondansetron (ZOFRAN) injection 4 mg  4 mg Intravenous Q6H PRN Saundra Shelling, MD   4 mg at 11/01/17 1657  . pantoprazole (PROTONIX) injection 40 mg  40 mg Intravenous Q12H Gladstone Lighter, MD   40 mg at 11/02/17 1100  . simethicone (MYLICON) chewable tablet 80 mg  80 mg Oral QID PRN Conforti, John, DO   80 mg at 11/01/17 0942  . sodium chloride flush (NS) 0.9 % injection 10-40 mL  10-40 mL Intracatheter Q12H Pyreddy, Pavan, MD   10 mL at 11/02/17 1108  . sodium chloride flush (NS) 0.9 % injection 10-40 mL  10-40 mL Intracatheter PRN Pyreddy, Reatha Harps, MD      . venlafaxine [facility]) tablet 25 mg  25 mg Oral TID WC Gladstone Lighter, MD         Discharge Medications: Please see discharge summary for a list of discharge medications.  Relevant Imaging Results:  Relevant Lab Results:   Additional Information (SSN: [id number])  Alfred Harrel, Veronia Beets, LCSW

## 2017-11-03 LAB — GLUCOSE, CAPILLARY
GLUCOSE-CAPILLARY: 172 mg/dL — AB (ref 65–99)
GLUCOSE-CAPILLARY: 221 mg/dL — AB (ref 65–99)

## 2017-11-03 MED ORDER — LIDOCAINE VISCOUS 2 % MT SOLN: 5.0000 mL | Freq: Three times a day (TID) | OROMUCOSAL | 0 refills | Status: AC | PRN

## 2017-11-03 MED ORDER — NYSTATIN 100000 UNIT/ML MT SUSP: 5.0000 mL | Freq: Four times a day (QID) | OROMUCOSAL | 0 refills | Status: AC

## 2017-11-03 MED ORDER — LACTULOSE 10 GM/15ML PO SOLN
30.0000 g | Freq: Two times a day (BID) | ORAL | Status: DC | PRN
  Administered 2017-11-03: 11:00:00 30 g via ORAL
  Filled 2017-11-03: qty 60

## 2017-11-03 MED ORDER — SENNOSIDES 8.8 MG/5ML PO SYRP: 15.0000 mL | ORAL_SOLUTION | Freq: Two times a day (BID) | ORAL | 0 refills | Status: AC

## 2017-11-03 MED ORDER — FENTANYL 50 MCG/HR TD PT72: 50.0000 ug | MEDICATED_PATCH | TRANSDERMAL | 0 refills | Status: DC

## 2017-11-03 MED ORDER — AMOXICILLIN-POT CLAVULANATE 400-57 MG/5ML PO SUSR: 800.0000 mg | Freq: Two times a day (BID) | ORAL | 0 refills | Status: DC

## 2017-11-03 MED ORDER — HYDROCODONE-ACETAMINOPHEN 7.5-325 MG/15ML PO SOLN: 15.0000 mL | ORAL | 0 refills | Status: AC | PRN

## 2017-11-03 MED ORDER — GABAPENTIN 250 MG/5ML PO SOLN: 200.0000 mg | Freq: Two times a day (BID) | ORAL | 0 refills | Status: AC

## 2017-11-03 MED ORDER — BISACODYL 10 MG RE SUPP
10.0000 mg | Freq: Once | RECTAL | Status: AC
  Administered 2017-11-03: 10 mg via RECTAL
  Filled 2017-11-03: qty 1

## 2017-11-03 NOTE — Care Management (Signed)
Patient for discharge home today.  Contacted [facility] and confirmed that patient currently open to services.  Patient declines need for home health aide.  Family to transport home

## 2017-11-03 NOTE — Progress Notes (Signed)
Discharge instructions along with home medications and follow up gone over with patient and family. They verbalize that they understood instructions. Prescriptions given to patient. IV removed and PICC line intact. Pt being discharged home on room air, no distress noted. Ammie Dalton, RN

## 2017-11-03 NOTE — Progress Notes (Signed)
  Speech Language Pathology Treatment: Dysphagia  Patient Details Name: Ann Larson MRN: [id number] DOB: 1956-06-18 Today's Date: 11/03/2017 Time: 1884-1660 SLP Time Calculation (min) (ACUTE ONLY): 18 min  Assessment / Plan / Recommendation Clinical Impression  Pt continues to present with a minimal oral pharyngeal disphagia characterized by minimal throat clear with medications. Pt had no overt ssx aspiration during intake of solid or semi solid bolus trials. Pt was not noted to have throat clar, coughing, wet vocal quality or watery eyes or nose. Pt independently increased her head for better positioning and stated she does not lay down to eat or drink. NSG states pt intakes pills WFL and has no noted difficulties at meal times. Pt will continue with current recommended diet. SLP educated on ssx aspiration to monitor.    HPI HPI: Pt is a 62 yo female who is known to Oncology service due to newly found metastatic pancreatic cancer to liver and lungs, hemorraghic gastritis, GERD, Obesity, hypothyroidism, depression, diverticulitis, w/ bilateral massive lower extremity acute DVT presented to ED due to weakness and near syncope. She was found to be anemic with hemoglobin of 6.6 and in acute kidney failure, and also hypotension. Admitted in ICU. S/p 2 units of PRBCs transfusion. Recent EGD showed Gastritis with hemorrhage, treated with argon plasma coagulation.       SLP Plan  Continue with current plan of care       Recommendations  Diet recommendations: Thin liquid;Regular Medication Administration: Crushed with puree Supervision: Patient able to self feed Compensations: Slow rate;Small sips/bites;Follow solids with liquid Postural Changes and/or Swallow Maneuvers: Out of bed for meals;Seated upright 90 degrees                Oral Care Recommendations: Oral care BID;Patient independent with oral care SLP Visit Diagnosis: Dysphagia, oropharyngeal phase (R13.12) Plan: Continue  with current plan of care       [place] 11/03/2017, 10:54 AM

## 2017-11-03 NOTE — Discharge Summary (Addendum)
[facility] NAME: Ann Larson    MR#:  [id number]  DATE OF BIRTH:  1956/05/17  DATE OF ADMISSION:  10/29/2017 ADMITTING PHYSICIAN: Saundra Shelling, MD  DATE OF DISCHARGE: No discharge date for patient encounter.  PRIMARY CARE PHYSICIAN: Lorelee Market, MD    ADMISSION DIAGNOSIS:  Dehydration [E86.0] Dizziness [R42] Symptomatic anemia [D64.9] GI bleed [K92.2]  DISCHARGE DIAGNOSIS:  Active Problems:   GI bleed   Dehydration   Dizziness   SECONDARY DIAGNOSIS:   Past Medical History:  Diagnosis Date  . Depression   . Diabetes mellitus without complication ([place])   . Diverticulitis   . DVT (deep vein thrombosis) in pregnancy ([place]) 08/29/2017   Right popliteal and femoral  . GERD (gastroesophageal reflux disease)   . Hypercalcemia 10/22/2017  . Hyperlipidemia   . Hypertension   . Hypothyroidism   . Obesity   . Pancreatic cancer ([place]) 10/22/2017    HOSPITAL COURSE:  62 year old female with past medical history significant for recent diagnosis of metastatic pancreatic cancer with DVT, diabetes, hypertension and hypercalcemia presents to hospital secondary to weakness and noted to have anemia with hemoglobin of 6.4  1. Acute on chronic anemia Stable  recent admissions in the last couple of months with anemia secondary to GI bleed status post argon beam coagulation of bleeding gastric mucosa 3 times in the last 2 months. ?  Secondary to recent chemotherapy, no active bleeding at this time, not on any anticoagulation, s/p blood transfusion while in house, treated with Protonix, gastroenterology did see patient while in house, and patient did well  2.hypotension Resolved secondary to hypovolemia and hemorrhagic shock Status post transfusion as stated above, successfully weaned off pressure support  3. Bilateral DVT Most likely secondary to pancreatic cancer Noted significant leg pain Treated with fentanyl  patch while in house, added Neurontin, U/S 2 weeks ago shown extensive occlusive bilateral lower extremity DVT Patient is not a candidate for anticoagulation secondary to significant GI bleed on Xarelto.  4. ARF  ATN from anemia and hypertension Improved status post transfusion, avoid nephrotoxic agents while in house  5. Metastatic pancreatic cancer s/p first dose of chemotherapy last week Oncology consult while in house, overall prognosis is poor, patient elects to have aggressive care/treatment going forward, patient not interested in hospice/palliative services   DISCHARGE CONDITIONS:  On the day of discharge patient is afebrile, hemodynamically stable, tolerating diet, ready for discharge to home with appropriate follow-up with primary care provider and oncology, on palliative services, for more specific details please see chart  CONSULTS OBTAINED:  Treatment Team:  Earlie Server, MD  DRUG ALLERGIES:   Allergies  Allergen Reactions  . Sulfa Antibiotics     DISCHARGE MEDICATIONS:   Allergies as of 11/03/2017      Reactions   Sulfa Antibiotics       Medication List    STOP taking these medications   HYDROmorphone 2 MG tablet Commonly known as:  DILAUDID   oxyCODONE 15 mg 12 hr tablet Commonly known as:  OXYCONTIN   oxyCODONE 5 MG immediate release tablet Commonly known as:  Oxy IR/ROXICODONE   prochlorperazine 10 MG tablet Commonly known as:  COMPAZINE     TAKE these medications   amoxicillin-clavulanate 400-57 MG/5ML suspension Commonly known as:  AUGMENTIN Take 10 mLs (800 mg total) by mouth every 12 (twelve) hours.   fentaNYL 50 MCG/HR Commonly known as:  DURAGESIC - dosed mcg/hr Place 1 patch (50 mcg  total) onto the skin every 3 (three) days. Start taking on:  11/04/2017   ferrous sulfate 325 (65 FE) MG EC tablet Take 1 tablet (325 mg total) by mouth 2 (two) times daily.   gabapentin 250 MG/5ML solution Commonly known as:  NEURONTIN Take 4 mLs (200 mg  total) by mouth every 12 (twelve) hours.   HYDROcodone-acetaminophen 7.5-325 mg/15 ml solution Commonly known as:  HYCET Take 15 mLs by mouth every 3 (three) hours as needed for moderate pain or severe pain.   levothyroxine 125 MCG tablet Commonly known as:  SYNTHROID, LEVOTHROID Take 125 mcg by mouth daily before breakfast.   lidocaine 2 % solution Commonly known as:  XYLOCAINE Use as directed 5 mLs in the mouth or throat 3 (three) times daily as needed for mouth pain.   lisinopril 20 MG tablet Commonly known as:  PRINIVIL,ZESTRIL Take 20 mg by mouth daily.   metoprolol succinate 100 MG 24 hr tablet Commonly known as:  TOPROL-XL Take 50 mg by mouth daily. Take with or immediately following a meal.   nystatin 100000 UNIT/ML suspension Commonly known as:  MYCOSTATIN Take 5 mLs (500,000 Units total) by mouth 4 (four) times daily.   omeprazole 40 MG capsule Commonly known as:  PRILOSEC Take 1 capsule (40 mg total) by mouth 2 (two) times daily before a meal.   ondansetron 8 MG tablet Commonly known as:  ZOFRAN Take 1 tablet (8 mg total) by mouth 2 (two) times daily as needed (Nausea or vomiting).   pioglitazone 45 MG tablet Commonly known as:  ACTOS Take 45 mg by mouth daily.   protein supplement shake Liqd Commonly known as:  PREMIER PROTEIN Take 325 mLs (11 oz total) by mouth daily.   senna 8.6 MG Tabs tablet Commonly known as:  SENOKOT Take 2 tablets (17.2 mg total) by mouth daily. Hold if loose stools. What changed:  Another medication with the same name was added. Make sure you understand how and when to take each.   sennosides 8.8 MG/5ML syrup Commonly known as:  SENOKOT Take 15 mLs by mouth 2 (two) times daily. What changed:  You were already taking a medication with the same name, and this prescription was added. Make sure you understand how and when to take each.   sucralfate 1 g tablet Commonly known as:  CARAFATE Take 1 tablet (1 g total) by mouth 4 (four)  times daily.   TOUJEO SOLOSTAR 300 UNIT/ML Sopn Generic drug:  Insulin Glargine Inject 15 Units into the skin daily.   venlafaxine XR 75 MG 24 hr capsule Commonly known as:  EFFEXOR-XR Take 75 mg by mouth daily with breakfast.        DISCHARGE INSTRUCTIONS:   If you experience worsening of your admission symptoms, develop shortness of breath, life threatening emergency, suicidal or homicidal thoughts you must seek medical attention immediately by calling 911 or calling your MD immediately  if symptoms less severe.  You Must read complete instructions/literature along with all the possible adverse reactions/side effects for all the Medicines you take and that have been prescribed to you. Take any new Medicines after you have completely understood and accept all the possible adverse reactions/side effects.   Please note  You were cared for by a hospitalist during your hospital stay. If you have any questions about your discharge medications or the care you received while you were in the hospital after you are discharged, you can call the unit and asked to speak with the  hospitalist on call if the hospitalist that took care of you is not available. Once you are discharged, your primary care physician will handle any further medical issues. Please note that NO REFILLS for any discharge medications will be authorized once you are discharged, as it is imperative that you return to your primary care physician (or establish a relationship with a primary care physician if you do not have one) for your aftercare needs so that they can reassess your need for medications and monitor your lab values.    Today   CHIEF COMPLAINT:   Chief Complaint  Patient presents with  . Weakness    HISTORY OF PRESENT ILLNESS:  62 y.o. female with a known history of diabetes mellitus type 2, GERD, hypercalcemia, hyperlipidemia, hypertension, recently diagnosed pancreatic cancer on chemotherapy presented to the  emergency room with generalized weakness patient had oncology doctor appointment today but felt very weak.  She had a lot of fatigue and tiredness.  Hence came to the emergency room hemoglobin is around 6.4.  Case was discussed with oncology physician by ER physician.  Patient had chemotherapy last week for pancreatic cancer.  In the past patient was seen by gastroenterology and had hemorrhagic gastritis.  She has history of DVT in the left lower extremity but anticoagulation deferred in view of hemorrhagic gastritis.  1 unit PRBC IV transfusion was ordered in the emergency room.  Further workup as per gastroenterology.  No vomiting of blood, any rectal bleed has noticed by the patient in the last few days.  VITAL SIGNS:  Blood pressure (!) 120/58, pulse 75, temperature 97.8 F (36.6 C), temperature source Oral, resp. rate 20, height 5\' 9"  (1.753 m), weight 105.1 kg (231 lb 11.2 oz), SpO2 99 %.  I/O:    Intake/Output Summary (Last 24 hours) at 11/03/2017 1032 Last data filed at 11/02/2017 2026 Gross per 24 hour  Intake -  Output 700 ml  Net -700 ml    PHYSICAL EXAMINATION:  GENERAL:  62 y.o.-year-old patient lying in the bed with no acute distress.  EYES: Pupils equal, round, reactive to light and accommodation. No scleral icterus. Extraocular muscles intact.  HEENT: Head atraumatic, normocephalic. Oropharynx and nasopharynx clear.  NECK:  Supple, no jugular venous distention. No thyroid enlargement, no tenderness.  LUNGS: Normal breath sounds bilaterally, no wheezing, rales,rhonchi or crepitation. No use of accessory muscles of respiration.  CARDIOVASCULAR: S1, S2 normal. No murmurs, rubs, or gallops.  ABDOMEN: Soft, non-tender, non-distended. Bowel sounds present. No organomegaly or mass.  EXTREMITIES: No pedal edema, cyanosis, or clubbing.  NEUROLOGIC: Cranial nerves II through XII are intact. Muscle strength 5/5 in all extremities. Sensation intact. Gait not checked.  PSYCHIATRIC: The  patient is alert and oriented x 3.  SKIN: No obvious rash, lesion, or ulcer.   DATA REVIEW:   CBC Recent Labs  Lab 11/02/17 0522  WBC 8.4  HGB 8.4*  HCT 26.2*  PLT 71*    Chemistries  Recent Labs  Lab 10/29/17 2236  10/31/17 0817  NA 136   < > 138  K 4.1   < > 3.8  CL 108   < > 112*  CO2 19*   < > 19*  GLUCOSE 164*   < > 297*  BUN 51*   < > 38*  CREATININE 2.58*   < > 1.44*  CALCIUM 8.4*   < > 7.7*  MG 2.1  --   --   AST 32  --   --  ALT 29  --   --   ALKPHOS 91  --   --   BILITOT 2.2*  --   --    < > = values in this interval not displayed.    Cardiac Enzymes Recent Labs  Lab 10/30/17 1043  TROPONINI <0.03    Microbiology Results  Results for orders placed or performed during the hospital encounter of 10/29/17  MRSA PCR Screening     Status: None   Collection Time: 10/29/17  9:05 PM  Result Value Ref Range Status   MRSA by PCR NEGATIVE NEGATIVE Final    Comment:        The GeneXpert MRSA Assay (FDA approved for NASAL specimens only), is one component of a comprehensive MRSA colonization surveillance program. It is not intended to diagnose MRSA infection nor to guide or monitor treatment for MRSA infections. Performed at [facility], [address], [place], [place] [address]     RADIOLOGY:  No results found.  EKG:   Orders placed or performed during the hospital encounter of 10/29/17  . ED EKG  . ED EKG  . EKG      Management plans discussed with the patient, family and they are in agreement.  CODE STATUS:     Code Status Orders  (From admission, onward)        Start     Ordered   10/29/17 1131  Full code  Continuous     10/29/17 1130    Code Status History    Date Active Date Inactive Code Status Order ID Comments User Context   10/16/2017 00:26 10/22/2017 20:38 Full Code [id number]  Lance Coon, MD ED   09/07/2017 16:09 09/09/2017 16:37 Full Code [id number]  Hillary Bow, MD ED      TOTAL TIME TAKING CARE OF  THIS PATIENT: 45 minutes.    Avel Peace Edith Groleau Larson on 11/03/2017 at 10:32 AM  Between 7am to 6pm - Pager - [phone]  After 6pm go to [email or web address] - password [email or web address] [facility]  Office  [phone]  CC: Primary care physician; Lorelee Market, MD   Note: This dictation was prepared with Dragon dictation along with smaller phrase technology. Any transcriptional errors that result from this process are unintentional.

## 2017-11-05 ENCOUNTER — Telehealth: Payer: Self-pay | Admitting: *Deleted

## 2017-11-05 NOTE — Telephone Encounter (Signed)
Does she have any thing for now?  I guess we will have to help her on this, will talk to [place] in AM.

## 2017-11-05 NOTE — Telephone Encounter (Signed)
Patient's daughter Morey Hummingbird called to Reschedule her mother's 10/29/17  Due to patient Being in the hospital  lab/MD/Venofer/+/- Infusion appts were Rescheduled to 11/09/17

## 2017-11-05 NOTE — Telephone Encounter (Signed)
Patient received prescription for Fentanyl when discharged from hospital. The problem is that it needs a prior authorization and her PCP is unable to do narcotics or the prior authorization for it. Palliative did not write for it so they will not do prior authorization, so they are asking if we will obtain prior authorization for her Fentanyl patches due to be changed today Dr Jerelyn Charles wrote prescription at discharge yesterday. Please advise

## 2017-11-05 NOTE — Telephone Encounter (Signed)
[facility] called asking for orders for Skilled Nursing 2 times week times 2 then 1 time a week until 5/3 Please call with Verbal order

## 2017-11-06 ENCOUNTER — Telehealth: Payer: Self-pay | Admitting: *Deleted

## 2017-11-06 ENCOUNTER — Other Ambulatory Visit: Payer: Self-pay | Admitting: *Deleted

## 2017-11-06 DIAGNOSIS — C259 Malignant neoplasm of pancreas, unspecified: Secondary | ICD-10-CM

## 2017-11-06 MED ORDER — FENTANYL 50 MCG/HR TD PT72: 50.0000 ug | MEDICATED_PATCH | TRANSDERMAL | 0 refills | Status: DC

## 2017-11-06 NOTE — Telephone Encounter (Signed)
[facility] needs verbal order from Dr. Dennis Bast to continue care. Patient has PI[facility] line that needs care. Orders for 2 times weekly for 2 weeks and then 1 time a week for 5 weeks.

## 2017-11-06 NOTE — Telephone Encounter (Signed)
Verbal order given per Dr Tasia Catchings

## 2017-11-06 NOTE — Telephone Encounter (Signed)
Verbal order given to Clarise Cruz per Dr Tasia Catchings

## 2017-11-06 NOTE — Telephone Encounter (Signed)
Prior authorization done online for Fentanyl patches; Prior authorization received from [facility] for the Fentanyl patches; RX sent to the pharmacy; called pharmacy to confirm receipt of RX and prior authorization; patient notified via telephone of approval.        dhs

## 2017-11-07 ENCOUNTER — Inpatient Hospital Stay: Payer: BLUE CROSS/BLUE SHIELD

## 2017-11-07 ENCOUNTER — Other Ambulatory Visit: Payer: Self-pay

## 2017-11-07 ENCOUNTER — Other Ambulatory Visit: Payer: Self-pay | Admitting: Oncology

## 2017-11-07 ENCOUNTER — Encounter: Payer: Self-pay | Admitting: Oncology

## 2017-11-07 ENCOUNTER — Inpatient Hospital Stay (HOSPITAL_BASED_OUTPATIENT_CLINIC_OR_DEPARTMENT_OTHER): Payer: BLUE CROSS/BLUE SHIELD | Admitting: Oncology

## 2017-11-07 VITALS — BP 127/78 | Temp 96.8°F | Wt 251.0 lb

## 2017-11-07 DIAGNOSIS — R530 Neoplastic (malignant) related fatigue: Secondary | ICD-10-CM | POA: Diagnosis not present

## 2017-11-07 DIAGNOSIS — E876 Hypokalemia: Secondary | ICD-10-CM | POA: Diagnosis not present

## 2017-11-07 DIAGNOSIS — C259 Malignant neoplasm of pancreas, unspecified: Secondary | ICD-10-CM

## 2017-11-07 DIAGNOSIS — Z5111 Encounter for antineoplastic chemotherapy: Secondary | ICD-10-CM | POA: Diagnosis present

## 2017-11-07 DIAGNOSIS — I82413 Acute embolism and thrombosis of femoral vein, bilateral: Secondary | ICD-10-CM

## 2017-11-07 DIAGNOSIS — C7800 Secondary malignant neoplasm of unspecified lung: Secondary | ICD-10-CM | POA: Diagnosis not present

## 2017-11-07 DIAGNOSIS — L02416 Cutaneous abscess of left lower limb: Secondary | ICD-10-CM | POA: Diagnosis not present

## 2017-11-07 DIAGNOSIS — D649 Anemia, unspecified: Secondary | ICD-10-CM

## 2017-11-07 DIAGNOSIS — C787 Secondary malignant neoplasm of liver and intrahepatic bile duct: Secondary | ICD-10-CM | POA: Diagnosis not present

## 2017-11-07 DIAGNOSIS — R112 Nausea with vomiting, unspecified: Secondary | ICD-10-CM | POA: Diagnosis not present

## 2017-11-07 DIAGNOSIS — R634 Abnormal weight loss: Secondary | ICD-10-CM | POA: Diagnosis not present

## 2017-11-07 DIAGNOSIS — L03116 Cellulitis of left lower limb: Secondary | ICD-10-CM | POA: Diagnosis not present

## 2017-11-07 DIAGNOSIS — D500 Iron deficiency anemia secondary to blood loss (chronic): Secondary | ICD-10-CM

## 2017-11-07 DIAGNOSIS — G893 Neoplasm related pain (acute) (chronic): Secondary | ICD-10-CM | POA: Diagnosis not present

## 2017-11-07 DIAGNOSIS — K250 Acute gastric ulcer with hemorrhage: Secondary | ICD-10-CM | POA: Diagnosis not present

## 2017-11-07 DIAGNOSIS — Z86718 Personal history of other venous thrombosis and embolism: Secondary | ICD-10-CM | POA: Diagnosis not present

## 2017-11-07 DIAGNOSIS — R531 Weakness: Secondary | ICD-10-CM | POA: Diagnosis not present

## 2017-11-07 DIAGNOSIS — K5903 Drug induced constipation: Secondary | ICD-10-CM | POA: Diagnosis not present

## 2017-11-07 LAB — CBC WITH DIFFERENTIAL/PLATELET
BASOS ABS: 0.1 10*3/uL (ref 0–0.1)
Basophils Relative: 1 %
Eosinophils Absolute: 0.1 10*3/uL (ref 0–0.7)
Eosinophils Relative: 1 %
HCT: 28.1 % — ABNORMAL LOW (ref 35.0–47.0)
HEMOGLOBIN: 9.2 g/dL — AB (ref 12.0–16.0)
LYMPHS PCT: 20 %
Lymphs Abs: 1.6 10*3/uL (ref 1.0–3.6)
MCH: 30 pg (ref 26.0–34.0)
MCHC: 32.7 g/dL (ref 32.0–36.0)
MCV: 91.9 fL (ref 80.0–100.0)
MONOS PCT: 6 %
Monocytes Absolute: 0.5 10*3/uL (ref 0.2–0.9)
Neutro Abs: 5.9 10*3/uL (ref 1.4–6.5)
Neutrophils Relative %: 72 %
Platelets: 141 10*3/uL — ABNORMAL LOW (ref 150–440)
RBC: 3.05 MIL/uL — AB (ref 3.80–5.20)
RDW: 18 % — ABNORMAL HIGH (ref 11.5–14.5)
WBC: 8.2 10*3/uL (ref 4.0–10.5)

## 2017-11-07 LAB — COMPREHENSIVE METABOLIC PANEL
ALBUMIN: 2.5 g/dL — AB (ref 3.5–5.0)
ALT: 44 U/L (ref 14–54)
ANION GAP: 10 (ref 5–15)
AST: 47 U/L — ABNORMAL HIGH (ref 15–41)
Alkaline Phosphatase: 112 U/L (ref 38–126)
BILIRUBIN TOTAL: 0.6 mg/dL (ref 0.3–1.2)
BUN: 10 mg/dL (ref 6–20)
CO2: 23 mmol/L (ref 22–32)
Calcium: 8.7 mg/dL — ABNORMAL LOW (ref 8.9–10.3)
Chloride: 106 mmol/L (ref 101–111)
Creatinine, Ser: 0.76 mg/dL (ref 0.44–1.00)
GFR calc Af Amer: >60 mL/min (ref >60)
Glucose, Bld: 171 mg/dL — ABNORMAL HIGH (ref 65–99)
POTASSIUM: 3.2 mmol/L — AB (ref 3.5–5.1)
Sodium: 139 mmol/L (ref 135–145)
TOTAL PROTEIN: 7 g/dL (ref 6.5–8.1)

## 2017-11-07 LAB — SAMPLE TO BLOOD BANK

## 2017-11-07 LAB — PATHOLOGIST SMEAR REVIEW

## 2017-11-07 MED ORDER — PROCHLORPERAZINE MALEATE 10 MG PO TABS
10.0000 mg | ORAL_TABLET | Freq: Once | ORAL | Status: AC
  Administered 2017-11-07: 10 mg via ORAL
  Filled 2017-11-07: qty 1

## 2017-11-07 MED ORDER — SODIUM CHLORIDE 0.9 % IV SOLN
Freq: Once | INTRAVENOUS | Status: AC
Start: 2017-11-07 — End: 2017-11-07
  Administered 2017-11-07: 11:00:00 via INTRAVENOUS
  Filled 2017-11-07: qty 1000

## 2017-11-07 MED ORDER — SODIUM CHLORIDE 0.9% FLUSH
10.0000 mL | INTRAVENOUS | Status: DC | PRN
  Administered 2017-11-07 (×2): 10 mL
  Filled 2017-11-07: qty 10

## 2017-11-07 MED ORDER — SODIUM CHLORIDE 0.9 % IV SOLN
2200.0000 mg | Freq: Once | INTRAVENOUS | Status: AC
  Administered 2017-11-07: 2200 mg via INTRAVENOUS
  Filled 2017-11-07: qty 52.6

## 2017-11-07 MED ORDER — POTASSIUM CHLORIDE 20 MEQ/100ML IV SOLN: 20.0000 meq | Freq: Once | INTRAVENOUS | Status: DC

## 2017-11-07 MED ORDER — DRONABINOL 5 MG PO CAPS: 5.0000 mg | ORAL_CAPSULE | Freq: Two times a day (BID) | ORAL | 0 refills | Status: DC

## 2017-11-07 MED ORDER — POTASSIUM CHLORIDE ER 10 MEQ PO TBCR: 10.0000 meq | EXTENDED_RELEASE_TABLET | Freq: Every day | ORAL | 0 refills | Status: DC

## 2017-11-07 MED ORDER — HEPARIN SOD (PORK) LOCK FLUSH 100 UNIT/ML IV SOLN
250.0000 [IU] | Freq: Once | INTRAVENOUS | Status: AC | PRN
  Administered 2017-11-07: 250 [IU]
  Filled 2017-11-07: qty 5

## 2017-11-07 MED ORDER — POTASSIUM CHLORIDE 20 MEQ/100ML IV SOLN
20.0000 meq | Freq: Once | INTRAVENOUS | Status: AC
  Administered 2017-11-07: 20 meq via INTRAVENOUS

## 2017-11-07 MED ORDER — POTASSIUM CHLORIDE 2 MEQ/ML IV SOLN: Freq: Once | INTRAVENOUS | Status: DC

## 2017-11-07 MED ORDER — SODIUM CHLORIDE 0.9 % IV SOLN
Freq: Once | INTRAVENOUS | Status: AC
  Administered 2017-11-07: 11:00:00 via INTRAVENOUS
  Filled 2017-11-07: qty 1000

## 2017-11-07 NOTE — Progress Notes (Signed)
Hematology/Oncology Follow Up Note [facility]  Telephone:([phone] [phone]  Patient Care Team: Lorelee Market, MD as PCP - General (Family Medicine)   Name of the patient: Ann Larson  [id number]  11-07-1955   REASON FOR VISIT Follow up hospital discharge.  HISTORY OF PRESENT ILLNESS Ann Larson is a  62 y.o.  female with PMH listed below who was referred to me for follow up after hospital discharge. I saw patient during her recent admission.  This is a 62 year old female with history of  right lower extremity DVT in January 2019, GI bleeding due to anticoagulation, status post IVC filter who presents to emergency room with episode of epigastric abdominal pain, shortness of breath, fatigue, and near syncope.  Hemoglobin was found to be 6.6.  Denies seeing blood in the stool or having black stool. # 08/29/2017.She developed right lower extremity occlusive cough and popliteal DVT extending into the distal femoral vein on  She was started on Xarelto and had subsequent GI bleeding.  On 1/ 28/2019 she presented with symptomatic anemia, GI bleeding.  She had another follow-up US venous lower extremity done,which showed propagation of deep vein thrombosis of the right lower extremity now involving right common femoral and femoral veins in addition to previously identified right popliteal and calf veins.  #EGD showed gastritis with hemorrhage, treated with argon plasma coagulation.  Anticoagulation was held and patient had IVC filter placed. [name] was hold.  09/23/17 She had a repeat right lower extremity venous Doppler done which showed occlusive DVT of the right common femoral, superficial femoral, profunda femoral, popliteal, posterior tibial  Veins. # 10/15/2017 Patient continues to have right thigh pain and discomfort and abdominal pain which triggered a CT abdomen pelvis angiogram which showed extensive near complete occlusive thrombus  extending from the common femoral vein to the IVC at the level of IVC filter, no definite thrombus noted above I VC, enlarged distal body and tail of the pancreas likely from extension of the previously seen pancreatic mass, metastatic disease including omental implant, splenic lesions, pulmonary nodules.  Small a situs.  Mildly enlarged porta hepatic lymph node as well as slightly rounded retroperitoneal and periaortic lymph nodes.  Focal ill-defined heterogeneous enhancement of the left lobe of the liver. # Biopsy of omental nodule pathology revealed metastatic pancreatic cancer, CA 19.9 is 124,491, consistent with metastatic pancreatic cancer.    INTERVAL HISTORY Patient presents for assessment prior to chemotherapy. She was admitted after cycle 1 Gemzar and Abraxane due to symptomatic anemia and hypotension, s/p PRBC transfusion. She was discharged home on 11/03/2017.  Today she is accompanied by her daughter and other family member to visit.  She was started on fentanyl patch 24mcg/h Q72 h and lower extremity pain has improved significantly. She has not need to take any PRN liquid hypdrocodone acetaminophen. Denies constipation.  Appetite is poor, does not eat much.     Opiate induced constipation: yes, Senna 2 tablets daily, and Miralax PRN Pain:1out of 10  Review of systems Review of Systems  Constitutional: Positive for malaise/fatigue and weight loss. Negative for chills and fever.  HENT: Negative for ear discharge and nosebleeds.   Eyes: Negative for double vision, photophobia and pain.  Respiratory: Negative for cough, sputum production and shortness of breath.   Cardiovascular: Positive for leg swelling. Negative for chest pain.  Gastrointestinal: Negative for blood in stool, nausea and vomiting.  Genitourinary: Negative for dysuria.  Musculoskeletal: Negative for back pain, myalgias and neck  pain.  Skin: Negative for rash.  Neurological: Positive for weakness. Negative for  dizziness, tingling, tremors and headaches.  Endo/Heme/Allergies: Negative for environmental allergies. Does not bruise/bleed easily.  Psychiatric/Behavioral: Negative for depression and substance abuse. The patient is not nervous/anxious.     Allergies  Allergen Reactions  . Sulfa Antibiotics      Past Medical History:  Diagnosis Date  . Depression   . Diabetes mellitus without complication ([place])   . Diverticulitis   . DVT (deep vein thrombosis) in pregnancy ([place]) 08/29/2017   Right popliteal and femoral  . GERD (gastroesophageal reflux disease)   . Hypercalcemia 10/22/2017  . Hyperlipidemia   . Hypertension   . Hypothyroidism   . Obesity   . Pancreatic cancer ([place]) 10/22/2017     Past Surgical History:  Procedure Laterality Date  . ABDOMINAL HYSTERECTOMY  2005  . CHOLECYSTECTOMY  2015  . COLONOSCOPY WITH PROPOFOL N/A 07/01/2015   Procedure: COLONOSCOPY WITH PROPOFOL;  Surgeon: Josefine Class, MD;  Location: [facility] ENDOSCOPY;  Service: Endoscopy;  Laterality: N/A;  . ESOPHAGOGASTRODUODENOSCOPY (EGD) WITH PROPOFOL N/A 07/01/2015   Procedure: ESOPHAGOGASTRODUODENOSCOPY (EGD) WITH PROPOFOL;  Surgeon: Josefine Class, MD;  Location: [facility] ENDOSCOPY;  Service: Endoscopy;  Laterality: N/A;  . ESOPHAGOGASTRODUODENOSCOPY (EGD) WITH PROPOFOL N/A 09/08/2017   Procedure: ESOPHAGOGASTRODUODENOSCOPY (EGD) WITH PROPOFOL;  Surgeon: Lucilla Lame, MD;  Location: [facility] ENDOSCOPY;  Service: Endoscopy;  Laterality: N/A;  . ESOPHAGOGASTRODUODENOSCOPY (EGD) WITH PROPOFOL N/A 09/21/2017   Procedure: ESOPHAGOGASTRODUODENOSCOPY (EGD) WITH PROPOFOL;  Surgeon: Lucilla Lame, MD;  Location: [facility] ENDOSCOPY;  Service: Endoscopy;  Laterality: N/A;  . ESOPHAGOGASTRODUODENOSCOPY (EGD) WITH PROPOFOL N/A 10/18/2017   Procedure: ESOPHAGOGASTRODUODENOSCOPY (EGD) WITH PROPOFOL;  Surgeon: [name], MD;  Location: [facility] ENDOSCOPY;  Service: Gastroenterology;  Laterality: N/A;  . IVC FILTER INSERTION N/A 09/08/2017     Procedure: IVC FILTER INSERTION;  Surgeon: Algernon Huxley, MD;  Location: [place] CV LAB;  Service: Cardiovascular;  Laterality: N/A;    Social History   Socioeconomic History  . Marital status: Married    Spouse name: Not on file  . Number of children: Not on file  . Years of education: Not on file  . Highest education level: Not on file  Social Needs  . Financial resource strain: Not on file  . Food insecurity - worry: Not on file  . Food insecurity - inability: Not on file  . Transportation needs - medical: Not on file  . Transportation needs - non-medical: Not on file  Occupational History  . Not on file  Tobacco Use  . Smoking status: Never Smoker  . Smokeless tobacco: Never Used  Substance and Sexual Activity  . Alcohol use: No  . Drug use: No  . Sexual activity: Not on file  Other Topics Concern  . Not on file  Social History Narrative  . Not on file    Family History  Problem Relation Age of Onset  . Deep vein thrombosis Neg Hx      Current Outpatient Medications:  .  amoxicillin-clavulanate (AUGMENTIN) 400-57 MG/5ML suspension, Take 10 mLs (800 mg total) by mouth every 12 (twelve) hours., Disp: 100 mL, Rfl: 0 .  fentaNYL (DURAGESIC - DOSED MCG/HR) 50 MCG/HR, Place 1 patch (50 mcg total) onto the skin every 3 (three) days., Disp: 10 patch, Rfl: 0 .  ferrous sulfate 325 (65 FE) MG EC tablet, Take 1 tablet (325 mg total) by mouth 2 (two) times daily., Disp: 60 tablet, Rfl: 1 .  gabapentin (NEURONTIN)  250 MG/5ML solution, Take 4 mLs (200 mg total) by mouth every 12 (twelve) hours., Disp: 300 mL, Rfl: 0 .  levothyroxine (SYNTHROID, LEVOTHROID) 125 MCG tablet, Take 125 mcg by mouth daily before breakfast., Disp: , Rfl:  .  lidocaine (XYLOCAINE) 2 % solution, Use as directed 5 mLs in the mouth or throat 3 (three) times daily as needed for mouth pain., Disp: 100 mL, Rfl: 0 .  lisinopril (PRINIVIL,ZESTRIL) 20 MG tablet, Take 20 mg by mouth daily., Disp: , Rfl: 1 .   metoprolol succinate (TOPROL-XL) 100 MG 24 hr tablet, Take 50 mg by mouth daily. Take with or immediately following a meal. , Disp: , Rfl:  .  nystatin (MYCOSTATIN) 100000 UNIT/ML suspension, Take 5 mLs (500,000 Units total) by mouth 4 (four) times daily., Disp: 60 mL, Rfl: 0 .  omeprazole (PRILOSEC) 40 MG capsule, Take 1 capsule (40 mg total) by mouth 2 (two) times daily before a meal., Disp: 60 capsule, Rfl: 0 .  ondansetron (ZOFRAN) 8 MG tablet, Take 1 tablet (8 mg total) by mouth 2 (two) times daily as needed (Nausea or vomiting)., Disp: 30 tablet, Rfl: 1 .  pioglitazone (ACTOS) 45 MG tablet, Take 45 mg by mouth daily., Disp: , Rfl: 0 .  protein supplement shake (PREMIER PROTEIN) LIQD, Take 325 mLs (11 oz total) by mouth daily., Disp: 30 Can, Rfl: 0 .  sucralfate (CARAFATE) 1 g tablet, Take 1 tablet (1 g total) by mouth 4 (four) times daily., Disp: 120 tablet, Rfl: 5 .  TOUJEO SOLOSTAR 300 UNIT/ML SOPN, Inject 15 Units into the skin daily., Disp: 3 mL, Rfl: 4 .  venlafaxine XR (EFFEXOR-XR) 75 MG 24 hr capsule, Take 75 mg by mouth daily with breakfast., Disp: , Rfl:  .  HYDROcodone-acetaminophen (HYCET) 7.5-325 mg/15 ml solution, Take 15 mLs by mouth every 3 (three) hours as needed for moderate pain or severe pain. (Patient not taking: Reported on 11/07/2017), Disp: 200 mL, Rfl: 0 .  potassium chloride (K-DUR) 10 MEQ tablet, Take 1 tablet (10 mEq total) by mouth daily., Disp: 10 tablet, Rfl: 0 .  sennosides (SENOKOT) 8.8 MG/5ML syrup, Take 15 mLs by mouth 2 (two) times daily. (Patient not taking: Reported on 11/07/2017), Disp: 240 mL, Rfl: 0 No current facility-administered medications for this visit.   Facility-Administered Medications Ordered in Other Visits:  .  gemcitabine (GEMZAR) 2,200 mg in sodium chloride 0.9 % 250 mL chemo infusion, 2,200 mg, Intravenous, Once, Earlie Server, MD .  heparin lock flush 100 unit/mL, 250 Units, Intracatheter, Once PRN, Earlie Server, MD .  potassium chloride 20 mEq in  100 mL IVPB, 20 mEq, Intravenous, Once, Earlie Server, MD, Stopped at 11/07/17 1330 .  sodium chloride flush (NS) 0.9 % injection 10 mL, 10 mL, Intracatheter, PRN, Earlie Server, MD, 10 mL at 11/07/17 1056  Physical exam:  Vitals:   11/07/17 1001  BP: 127/78  Temp: (!) 96.8 F (36 C)  TempSrc: Tympanic  Weight: 251 lb (113.9 kg)  ECOG 2 Physical Exam  Constitutional: She is oriented to person, place, and time and well-developed, well-nourished, and in no distress. No distress.  HENT:  Head: Normocephalic and atraumatic.  Mouth/Throat: Oropharynx is clear and moist. No oropharyngeal exudate.  Eyes: Pupils are equal, round, and reactive to light. Left eye exhibits no discharge. No scleral icterus.  pallor  Neck: Normal range of motion. Neck supple.  Cardiovascular: Normal rate, regular rhythm and normal heart sounds.  Pulmonary/Chest: Effort normal and breath sounds normal. She  has no wheezes. She has no rales.  Abdominal: Bowel sounds are normal. She exhibits no mass. There is no rebound and no guarding.  Generalized discomfort with palpation.   Musculoskeletal: Normal range of motion. She exhibits no edema.  Lymphadenopathy:    She has no cervical adenopathy.  Neurological: She is alert and oriented to person, place, and time. No cranial nerve deficit.  Skin: Skin is warm and dry. No rash noted. No erythema.  Psychiatric: Mood, affect and judgment normal.    CMP Latest Ref Rng & Units 11/07/2017  Glucose 65 - 99 mg/dL 171(H)  BUN 6 - 20 mg/dL 10  Creatinine 0.44 - 1.00 mg/dL 0.76  Sodium 135 - 145 mmol/L 139  Potassium 3.5 - 5.1 mmol/L 3.2(L)  Chloride 101 - 111 mmol/L 106  CO2 22 - 32 mmol/L 23  Calcium 8.9 - 10.3 mg/dL 8.7(L)  Total Protein 6.5 - 8.1 g/dL 7.0  Total Bilirubin 0.3 - 1.2 mg/dL 0.6  Alkaline Phos 38 - 126 U/L 112  AST 15 - 41 U/L 47(H)  ALT 14 - 54 U/L 44   CBC Latest Ref Rng & Units 11/07/2017  WBC 4.0 - 10.5 K/uL 8.2  Hemoglobin 12.0 - 16.0 g/dL 9.2(L)    Hematocrit 35.0 - 47.0 % 28.1(L)  Platelets 150 - 440 K/uL 141(L)    Dg Chest 1 View  Result Date: 10/15/2017 CLINICAL DATA:  Upper abdominal pain EXAM: CHEST 1 VIEW COMPARISON:  09/07/2016 FINDINGS: The heart size and mediastinal contours are within normal limits. Both lungs are clear. The visualized skeletal structures are unremarkable. IMPRESSION: No active disease. Electronically Signed   By: Donavan Foil M.D.   On: 10/15/2017 22:48   Nm Pulmonary Perf And Vent  Result Date: 10/16/2017 CLINICAL DATA:  Chest pain and shortness of breath, high clinical suspicion of pulmonary embolism EXAM: NUCLEAR MEDICINE VENTILATION - PERFUSION LUNG SCAN TECHNIQUE: Ventilation images were obtained in multiple projections using inhaled aerosol Tc-37m DTPA. Perfusion images were obtained in multiple projections after intravenous injection of Tc-74m-MAA. RADIOPHARMACEUTICALS:  34.104 mCi of Tc-34m DTPA aerosol inhalation and 4.008 mCi Tc70m-MAA IV COMPARISON:  None Correlation: Chest radiograph 10/15/2017 FINDINGS: Ventilation: No focal ventilation defects. Minimal central airway deposition of aerosol. Small amount of swallowed aerosol within stomach. Perfusion: Single small subsegmental perfusion defect at if RIGHT upper lobe. No other definite focal perfusion abnormalities. Chest radiograph: Clear lungs.  Normal heart size. IMPRESSION: Very low probability for pulmonary embolism. Electronically Signed   By: Lavonia Dana M.D.   On: 10/16/2017 16:41   US Venous Img Lower Bilateral  Result Date: 10/17/2017 CLINICAL DATA:  Bilateral lower extremity pain and edema, right greater than left. History of prior DVT and IVC filter placement. EXAM: BILATERAL LOWER EXTREMITY VENOUS DOPPLER ULTRASOUND TECHNIQUE: Gray-scale sonography with graded compression, as well as color Doppler and duplex ultrasound were performed to evaluate the lower extremity deep venous systems from the level of the common femoral vein and including  the common femoral, femoral, profunda femoral, popliteal and calf veins including the posterior tibial, peroneal and gastrocnemius veins when visible. The superficial great saphenous vein was also interrogated. Spectral Doppler was utilized to evaluate flow at rest and with distal augmentation maneuvers in the common femoral, femoral and popliteal veins. COMPARISON:  Right lower extremity venous Doppler - 09/23/2017; 09/17/2017; 08/29/2017; CT scan abdomen pelvis-10/16/2017 FINDINGS: RIGHT LOWER EXTREMITY There is mixed echogenic occlusive DVT extending from the right common femoral vein through the saphenofemoral junction, the imaged portions of  the deep femoral vein, the proximal, mid and distal aspects of the right femoral vein, the popliteal vein and extending to involve the imaged portions of the tibial veins. Other Findings:  None. LEFT LOWER EXTREMITY There is mixed echogenic occlusive DVT extending from the left common femoral vein through the saphenofemoral junction, the imaged portions of the deep femoral vein, the proximal, mid and distal aspects of the left femoral vein, the popliteal vein and extending to involve the imaged portions of the tibial veins. Other Findings:  None. IMPRESSION: Examination is positive for extensive occlusive bilateral lower extremity DVT involving the entirety of the bilateral lower extremity venous system. Note, bilateral pelvic venous and IVC thrombus was seen on preceding abdominal CT performed 10/16/2017. These results will be called to the ordering clinician or representative by the Radiologist Assistant, and communication documented in the PACS or zVision Dashboard. Electronically Signed   By: Sandi Mariscal M.D.   On: 10/17/2017 11:02   Ct Biopsy  Result Date: 10/17/2017 INDICATION: Right lower quadrant omental mass, unknown primary EXAM: CT-GUIDED BIOPSY RIGHT LOWER QUADRANT OMENTAL MASS MEDICATIONS: 1% LIDOCAINE LOCAL ANESTHESIA/SEDATION: 2.0 mg IV Versed; 75 mcg IV  Fentanyl Moderate Sedation Time:  11 MINUTES The patient was continuously monitored during the procedure by the interventional radiology nurse under my direct supervision. PROCEDURE: The procedure, risks, benefits, and alternatives were explained to the patient. Questions regarding the procedure were encouraged and answered. The patient understands and consents to the procedure. Previous imaging reviewed. Patient positioned supine. Noncontrast localization CT performed. The right lower quadrant omental mass anterior to the cecum was localized. Overlying skin marked. Under sterile conditions and local anesthesia, a 17 gauge 6.8 cm access needle was advanced from a lateral approach to the right lower quadrant omental mass. Needle position confirmed with CT. 18 gauge core biopsies obtained. Samples placed in formalin. Needle removed. Postprocedure imaging demonstrates no hemorrhage or hematoma. Patient tolerated the procedure well without complication. Vital sign monitoring by nursing staff during the procedure will continue as patient is in the special procedures unit for post procedure observation. FINDINGS: The images document guide needle placement within the right lower quadrant omental mass. Post biopsy images demonstrate no hemorrhage or hematoma. COMPLICATIONS: None immediate. IMPRESSION: Successful CT-guided core biopsy of the right lower quadrant omental mass Electronically Signed   By: Jerilynn Mages.  Shick M.D.   On: 10/17/2017 13:14   Dg Chest Port 1 View  Result Date: 10/29/2017 CLINICAL DATA:  Syncopal episode EXAM: PORTABLE CHEST 1 VIEW COMPARISON:  10/15/2017 FINDINGS: Right-sided PICC line is noted with the catheter tip in the superior aspect of the right atrium. Lungs are well aerated bilaterally without focal infiltrate. Some nodular appearing densities are noted in the bases bilaterally similar to that seen on prior CT examination. No other focal abnormality is noted. IMPRESSION: Stable appearing  metastatic disease within the lungs. PICC line as described. Electronically Signed   By: Inez Catalina M.D.   On: 10/29/2017 21:29   [place] Ekg Site Rite  Result Date: 10/26/2017 If Site Rite image not attached, placement could not be confirmed due to current cardiac rhythm.  Ct Angio Abd/pel W And/or Wo Contrast  Result Date: 10/16/2017 CLINICAL DATA:  62 year old female with history of pancreatitis presents with leg and abdominal pain. EXAM: CTA ABDOMEN AND PELVIS wITHOUT AND WITH CONTRAST TECHNIQUE: Multidetector CT imaging of the abdomen and pelvis was performed using the standard protocol during bolus administration of intravenous contrast. Multiplanar reconstructed images and MIPs were obtained and  reviewed to evaluate the vascular anatomy. CONTRAST:  176mL ISOVUE-370 IOPAMIDOL (ISOVUE-370) INJECTION 76% COMPARISON:  Abdominal CT dated 02/07/2017 FINDINGS: VASCULAR Aorta: Mild atherosclerotic calcification. No aneurysmal dilatation or dissection. Celiac: Patent without evidence of aneurysm, dissection, vasculitis or significant stenosis. SMA: Patent without evidence of aneurysm, dissection, vasculitis or significant stenosis. Renals: Both renal arteries are patent without evidence of aneurysm, dissection, vasculitis, fibromuscular dysplasia or significant stenosis. IMA: Patent without evidence of aneurysm, dissection, vasculitis or significant stenosis. Inflow: Mild atherosclerotic calcification of the iliac vessels. No aneurysmal dilatation or dissection. The iliac arteries are patent bilaterally. Proximal Outflow: Bilateral common femoral and visualized portions of the superficial and profunda femoral arteries are patent without evidence of aneurysm, dissection, vasculitis or significant stenosis. Veins: Extensive near occlusive thrombus extending from the visualized portions of the common femoral veins, external and common iliac veins and IVC to the level of the IVC filter. There is small amount of  contrast in the peripheral lumen of these vessels. The suprarenal IVC filled with contrast and appears patent. No definite thrombus identified above the level of the IVC filter. The SMV, the SMV, and main portal vein are patent. 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 No portal venous gas. Review of the MIP images confirms the above findings. NON-VASCULAR Lower chest: Multiple bilateral pulmonary nodules most consistent with metastatic disease. An infectious process or septic emboli are less likely. Clinical correlation is recommended. Partially visualized area of lower attenuation in the right lower lobe/infrahilar vascular confluence (series 6, image 5). This is incompletely evaluated and may represent volume averaging artifact with perihilar fat or scarring. Acute pulmonary embolus is not entirely excluded. Clinical correlation is recommended. If there is clinical concern for acute PE further evaluation with CTA of the chest or V/Q scan recommended. There is no intra-abdominal free air. Small upper abdominal ascites as well as small amount of fluid within the pelvis. Hepatobiliary: An ill-defined focal area of heterogeneous enhancement is seen in the left lobe of the liver (series 6, image 23). The liver is otherwise unremarkable. No intrahepatic biliary ductal dilatation. Cholecystectomy. Pancreas: There is fatty infiltration of the uncinate and head of the pancreas. There is prominence of the soft tissues of the distal body and tail of the pancreas which may represent extension of the previously seen pancreatic mass or pancreatitis. Correlation with pancreatic enzymes recommended. Spleen: Multiple small splenic hypodense lesions appear new or more prominent compared to the prior CT and concerning for extension of pancreatic neoplasm. Areas of infarct is less likely but not excluded. Adrenals/Urinary Tract: The adrenal glands are unremarkable. The kidneys, and the  visualized ureters appear unremarkable as well. The urinary bladder is predominantly collapsed. There is apparent diffuse thickening of the bladder wall which may be partly related to underdistention. Cystitis is not excluded. Correlation with urinalysis recommended. Stomach/Bowel: Postsurgical changes of bowel with anastomotic suture in the sigmoid colon. There is sigmoid diverticulosis without active inflammatory changes. Moderate amount of stool noted throughout the colon no bowel obstruction. Normal appendix. Lymphatic: Mildly enlarged lymph node in the porta hepaticas measuring 14 mm in short axis. Multiple mildly rounded small retroperitoneal and para-aortic lymph nodes. Reproductive: Hysterectomy. Other: Extensive omental implants and nodularity, new compared to prior CT and consistent with omental caking. Musculoskeletal: Degenerative changes of the spine. No acute osseous pathology. IMPRESSION: VASCULAR 1. Extensive near complete occlusive thrombus extending from the common femoral veins to the IVC at the level of the IVC filter. No definite  thrombus noted above the IVC. 2. Partially visualized small low-attenuation in the right lower lobe/right infrahilar pulmonary artery confluence may represent perivascular hilar fat or scarring. Acute PE is not entirely excluded. CTA of the chest or V/Q scan may provide better evaluation if there is clinical concern for acute PE. 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 NON-VASCULAR 1. Enlargement of the distal body and tail of the pancreas likely from extension of the previously seen pancreatic mass. 2. Metastatic disease including omental implant, splenic lesions, and pulmonary nodules. Small ascites, likely malignant. Further evaluation with PET-CT and oncological consult is advised. 3. Mildly enlarged porta hepatic lymph node as well as slightly rounded retroperitoneal and para-aortic lymph nodes. 4. Focal ill-defined heterogeneous enhancement of the left lobe of  the liver. These results were called by telephone at the time of interpretation on 10/16/2017 at 1:28 am to Dr. Jannifer Franklin, who verbally acknowledged these results. Electronically Signed   By: Anner Crete M.D.   On: 10/16/2017 01:29      Assessment and plan- Patient is a34 y.o.femalewith history of hemorraghic gastritis, extensivebilaterallower extremity DVT not on anticoagulation, newly diagnosed metastatic pancreatic cancer presents for follow up.   1. Malignant neoplasm of pancreas, unspecified location of malignancy ([place])   2. Symptomatic anemia   3. Acute deep vein thrombosis (DVT) of femoral vein of both lower extremities ([facility])   4. Iron deficiency anemia due to chronic blood loss   5. Hypokalemia   6. Encounter for antineoplastic chemotherapy    # Metastatic pancreatic cancer,  involving lung, liver, omental caking. S/p one treatment of Gemzar and Abraxane, missed Day 8 treatment due to hospitalization.  Proceed with cycle 1 Day 14 Gemcitabine today. Given her performance status and multiple ongoing issue,  I will hold Abraxane and only proceed with single agent Gemcitabine.   # Hypercalcemia secondary to metastatic pancreatic cancer, Delton See was not approved by insurance. Peer to peer review was made, still does not approve Xgeva. S/p Zometa  3mg  x 1 on 3/7, calcium stable.    # symptomatic anemia, continue monitor, S/p EGD which revealed bleeding ulcer, treated with homospray. Hemoglobin is stable today. Plan  Venofer 200mg  x1  this Friday.    # Iron deficiency anemia secondary to blood loss: see above.   # Extensivebilaterallower extremity DVT: anticoagulation is contraindicated at this point due to ongoing blood loss. DVT likely related to her metastatic tumor burden.    # Hypokalemia: will give 63meq KCL IV x1 , start oral Kdur 68meq daily x 10 days. Repeat labs in 1 week.   Total face to face encounter time for this patient visit was 40 min. >50% of the time was  spent  in counseling and coordination of care.   Follow up visit: 1 week to see NP and 2 weeks to see me prior to her cycle 2 Gemzar +/- abraxane.   Earlie Server, MD, PhD Hematology Oncology [facility] at [facility] Pager- [phone] 11/07/2017

## 2017-11-07 NOTE — Progress Notes (Signed)
Pt in for follow up.  Reports having more nausea but relieved with zofran and compazine.

## 2017-11-09 ENCOUNTER — Ambulatory Visit: Payer: BLUE CROSS/BLUE SHIELD

## 2017-11-09 ENCOUNTER — Inpatient Hospital Stay: Payer: BLUE CROSS/BLUE SHIELD

## 2017-11-09 ENCOUNTER — Ambulatory Visit: Payer: BLUE CROSS/BLUE SHIELD | Admitting: Oncology

## 2017-11-09 ENCOUNTER — Other Ambulatory Visit: Payer: BLUE CROSS/BLUE SHIELD

## 2017-11-09 ENCOUNTER — Other Ambulatory Visit: Payer: Self-pay | Admitting: Oncology

## 2017-11-09 DIAGNOSIS — R112 Nausea with vomiting, unspecified: Secondary | ICD-10-CM

## 2017-11-09 DIAGNOSIS — C259 Malignant neoplasm of pancreas, unspecified: Secondary | ICD-10-CM | POA: Diagnosis not present

## 2017-11-09 DIAGNOSIS — R110 Nausea: Secondary | ICD-10-CM

## 2017-11-09 MED ORDER — SODIUM CHLORIDE 0.9 % IV SOLN
INTRAVENOUS | Status: DC
  Administered 2017-11-09: 14:00:00 via INTRAVENOUS
  Filled 2017-11-09: qty 1000

## 2017-11-09 MED ORDER — IRON SUCROSE 20 MG/ML IV SOLN
200.0000 mg | Freq: Once | INTRAVENOUS | Status: AC
Start: 2017-11-09 — End: 2017-11-09
  Administered 2017-11-09: 200 mg via INTRAVENOUS
  Filled 2017-11-09: qty 10

## 2017-11-09 MED ORDER — HEPARIN SOD (PORK) LOCK FLUSH 100 UNIT/ML IV SOLN
INTRAVENOUS | Status: AC
  Filled 2017-11-09: qty 5

## 2017-11-09 MED ORDER — HEPARIN SOD (PORK) LOCK FLUSH 100 UNIT/ML IV SOLN
500.0000 [IU] | Freq: Once | INTRAVENOUS | Status: AC
  Administered 2017-11-09: 250 [IU] via INTRAVENOUS

## 2017-11-09 MED ORDER — ONDANSETRON HCL 4 MG/2ML IJ SOLN
8.0000 mg | Freq: Once | INTRAMUSCULAR | Status: AC
  Administered 2017-11-09: 8 mg via INTRAVENOUS
  Filled 2017-11-09: qty 4

## 2017-11-09 MED ORDER — DRONABINOL 5 MG PO CAPS: 5.0000 mg | ORAL_CAPSULE | Freq: Two times a day (BID) | ORAL | 0 refills | Status: AC

## 2017-11-09 MED ORDER — PROCHLORPERAZINE MALEATE 10 MG PO TABS
10.0000 mg | ORAL_TABLET | Freq: Once | ORAL | Status: AC
  Administered 2017-11-09: 10 mg via ORAL
  Filled 2017-11-09: qty 1

## 2017-11-12 ENCOUNTER — Telehealth (INDEPENDENT_AMBULATORY_CARE_PROVIDER_SITE_OTHER): Payer: Self-pay

## 2017-11-12 NOTE — Telephone Encounter (Signed)
I attempted to call the patient and left a message on voicemail for her to return a call to the office

## 2017-11-14 ENCOUNTER — Inpatient Hospital Stay (HOSPITAL_BASED_OUTPATIENT_CLINIC_OR_DEPARTMENT_OTHER): Payer: BLUE CROSS/BLUE SHIELD | Admitting: Oncology

## 2017-11-14 ENCOUNTER — Inpatient Hospital Stay: Payer: BLUE CROSS/BLUE SHIELD

## 2017-11-14 ENCOUNTER — Inpatient Hospital Stay: Payer: BLUE CROSS/BLUE SHIELD | Admitting: Oncology

## 2017-11-14 ENCOUNTER — Other Ambulatory Visit: Payer: Self-pay | Admitting: Nurse Practitioner

## 2017-11-14 ENCOUNTER — Telehealth: Payer: Self-pay | Admitting: Nurse Practitioner

## 2017-11-14 VITALS — BP 104/67 | HR 78 | Temp 98.0°F | Resp 20

## 2017-11-14 DIAGNOSIS — I82413 Acute embolism and thrombosis of femoral vein, bilateral: Secondary | ICD-10-CM

## 2017-11-14 DIAGNOSIS — C259 Malignant neoplasm of pancreas, unspecified: Secondary | ICD-10-CM

## 2017-11-14 DIAGNOSIS — L02416 Cutaneous abscess of left lower limb: Secondary | ICD-10-CM | POA: Diagnosis not present

## 2017-11-14 DIAGNOSIS — R112 Nausea with vomiting, unspecified: Secondary | ICD-10-CM

## 2017-11-14 DIAGNOSIS — L03116 Cellulitis of left lower limb: Secondary | ICD-10-CM

## 2017-11-14 DIAGNOSIS — D729 Disorder of white blood cells, unspecified: Secondary | ICD-10-CM

## 2017-11-14 LAB — COMPREHENSIVE METABOLIC PANEL
ALK PHOS: 139 U/L — AB (ref 38–126)
ALT: 42 U/L (ref 14–54)
ANION GAP: 16 — AB (ref 5–15)
AST: 65 U/L — ABNORMAL HIGH (ref 15–41)
Albumin: 2.3 g/dL — ABNORMAL LOW (ref 3.5–5.0)
BILIRUBIN TOTAL: 1.3 mg/dL — AB (ref 0.3–1.2)
BUN: 9 mg/dL (ref 6–20)
CALCIUM: 8.6 mg/dL — AB (ref 8.9–10.3)
CO2: 21 mmol/L — ABNORMAL LOW (ref 22–32)
Chloride: 103 mmol/L (ref 101–111)
Creatinine, Ser: 0.92 mg/dL (ref 0.44–1.00)
GFR calc non Af Amer: >60 mL/min (ref >60)
Glucose, Bld: 153 mg/dL — ABNORMAL HIGH (ref 65–99)
Potassium: 3.1 mmol/L — ABNORMAL LOW (ref 3.5–5.1)
Sodium: 140 mmol/L (ref 135–145)
TOTAL PROTEIN: 7.3 g/dL (ref 6.5–8.1)

## 2017-11-14 LAB — CBC WITH DIFFERENTIAL/PLATELET
BAND NEUTROPHILS: 19 %
Basophils Absolute: 0 10*3/uL (ref 0–0.1)
Basophils Relative: 0 %
Eosinophils Absolute: 0 10*3/uL (ref 0–0.7)
Eosinophils Relative: 0 %
HEMATOCRIT: 26.3 % — AB (ref 35.0–47.0)
HEMOGLOBIN: 8.5 g/dL — AB (ref 12.0–16.0)
LYMPHS ABS: 2.2 10*3/uL (ref 1.0–3.6)
LYMPHS PCT: 16 %
MCH: 30.3 pg (ref 26.0–34.0)
MCHC: 32.3 g/dL (ref 32.0–36.0)
MCV: 93.8 fL (ref 80.0–100.0)
MYELOCYTES: 4 %
Metamyelocytes Relative: 4 %
Monocytes Absolute: 0.3 10*3/uL (ref 0.2–0.9)
Monocytes Relative: 2 %
NEUTROS ABS: 11.2 10*3/uL — AB (ref 1.4–6.5)
NEUTROS PCT: 55 %
NRBC: 6 /100{WBCs} — AB
Platelets: 192 10*3/uL (ref 150–440)
RBC: 2.8 MIL/uL — ABNORMAL LOW (ref 3.80–5.20)
RDW: 19.5 % — ABNORMAL HIGH (ref 11.5–14.5)
WBC: 13.7 10*3/uL — ABNORMAL HIGH (ref 4.0–10.5)

## 2017-11-14 LAB — SAMPLE TO BLOOD BANK

## 2017-11-14 LAB — MAGNESIUM: Magnesium: 2.1 mg/dL (ref 1.7–2.4)

## 2017-11-14 MED ORDER — ONDANSETRON HCL 4 MG/2ML IJ SOLN
8.0000 mg | Freq: Once | INTRAMUSCULAR | Status: AC
  Administered 2017-11-14: 8 mg via INTRAVENOUS
  Filled 2017-11-14: qty 4

## 2017-11-14 MED ORDER — DEXAMETHASONE 0.5 MG/5ML PO SOLN: 2.0000 mg | Freq: Every day | ORAL | 0 refills | Status: DC

## 2017-11-14 MED ORDER — SODIUM CHLORIDE 0.9 % IV SOLN
INTRAVENOUS | Status: AC
  Administered 2017-11-14: 12:00:00 via INTRAVENOUS
  Filled 2017-11-14: qty 1000

## 2017-11-14 MED ORDER — SODIUM CHLORIDE 0.9 % IV SOLN: Freq: Once | INTRAVENOUS | Status: DC

## 2017-11-14 MED ORDER — DEXAMETHASONE SODIUM PHOSPHATE 10 MG/ML IJ SOLN
10.0000 mg | Freq: Once | INTRAMUSCULAR | Status: AC
  Administered 2017-11-14: 10 mg via INTRAVENOUS
  Filled 2017-11-14: qty 1

## 2017-11-14 MED ORDER — AMOXICILLIN-POT CLAVULANATE 400-57 MG/5ML PO SUSR: 800.0000 mg | Freq: Two times a day (BID) | ORAL | 0 refills | Status: DC

## 2017-11-14 NOTE — Telephone Encounter (Signed)
Called patient to follow-up for cancelled appointment. Spoke to husband. He reports she is coming at 10-10:30.

## 2017-11-14 NOTE — Progress Notes (Signed)
Patient here today as an acute add on for increased leg swelling and tightness. She reports feeling very tired and fatigued and has some nausea.

## 2017-11-14 NOTE — Progress Notes (Signed)
Symptom Management Consult note [facility]  [phone] [phone]  Patient Care Team: Lorelee Market, MD as PCP - General (Family Medicine)   Name of the patient: Roselene Gray  [id number]  March 29, 1956   Date of visit: 11/15/17  Diagnosis- Metastatic Pancreatic Cancer  Chief complaint/ Reason for visit- Possible IV fluids/blood  Heme/Onc history: Patient was last seen by primary oncologist Dr. Tasia Catchings on 11/07/2017 for hospital follow-up. Patient is status post day 14 cycle 1 Gemzar/Abraxane on 11/07/2017. Abraxane was held due to decline in performance status and multiple health concerns/comorbidities. The patient continued to have poor oral intake and a poor appetite. Lower extremity pain from recent DVT had significantly improved with the addition of a fentanyl patch 50 mcg/h while inpatient.   Patient initially was diagnosed with a right lower extremity DVT in January 2019 and was placed on anticoagulation but subsequently had GI bleeding. Seen in the emergency room for symptomatic anemia with a hemoglobin of 6.6. Right lower extremity occlusive DVT in popliteal DVT was extensive extending into the distal femoral vein. She was taken off anticoagulation and had IVC filter placed. Also had EGD showing gastritis with hemorrhage. She was treated with argon plasma coagulation. 09/23/2017 had a repeat right lower extremity Doppler continuing to show occlusive DVT of the right common femoral, superficial femoral, profunda femoral and popliteal veins. 10/15/2017 patient coomplained of  Right and NEW LEFT thigh pain and discomfort with abdominal pain. Ultrasound on 10/17/17 of bilateral lower extremity revealed extensive occlusive bilateral lower extremity DVT involving the entirety of the bilateral lower extremity venous system. CTA abdomen/pelvis showed extensive complete occlusive thrombus extending from the common femoral vein to the IVC at the  level of IVC filter. It also showed new findings of an enlarged distal body and tail of the pancreas likely from extension of pancreatic mass. Biopsy revealed metastatic pancreatic cancer. Initial CA 19.9 was 124,491.   She was admitted to the hospital on 10/29/2017 for shortness of breath in dizziness. She was found to be anemic with hemoglobin of 6.4. She was given several blood transfusions and GI was consulted and Protonix was added. At discharge, she was not actively bleeding and not on any anticoagulation. She continued to have bilateral leg pain and a Fentanyl patch was added to her pain medication regimen. She was to continue Neurontin.   Interval history-  Patient complains of swelling and pain of the left leg/ Right leg has resolved. Symptoms have been present for 3 week(s)  and is progressive.  She has had similar problems in the past with right leg. The patient is not able to ambulate.  Risk factors for hypercoaguable state inlcude:  cancer, prior venous thromboembolism and Diagnosed Bilateral DVT S/p IVC filter placment. Worsening pain and swelling/heat to left extremity extending up to hip. Unable to lift leg. Recently treated with Amoxoicillin 400-57 mg/15ml liquid solution for 5 days for possible cellulitis. Patient noted slight improvement but it is now significantly worse. Right leg as resolved.  ECOG FS:4 - Bedbound  Review of systems- Review of Systems  Constitutional: Positive for malaise/fatigue. Negative for chills, fever and weight loss.  HENT: Negative for congestion and ear pain.   Eyes: Negative.  Negative for blurred vision and double vision.  Respiratory: Negative.  Negative for cough, sputum production and shortness of breath.   Cardiovascular: Positive for leg swelling (L>R). Negative for chest pain, palpitations and orthopnea.  Gastrointestinal: Positive for nausea. Negative for abdominal pain, blood  in stool, constipation, diarrhea and vomiting.  Genitourinary:  Negative for dysuria, frequency and urgency.  Musculoskeletal: Positive for falls (Very unsteady on feet) and myalgias. Negative for back pain.  Skin: Positive for rash (Left leg- tight/hot leg/dry skin).  Neurological: Negative.  Negative for weakness and headaches.  Endo/Heme/Allergies: Negative.  Does not bruise/bleed easily.  Psychiatric/Behavioral: Negative.  Negative for depression. The patient is not nervous/anxious and does not have insomnia.      Current treatment- S/p Cycle 1 Day 14 Gemzar  Allergies  Allergen Reactions  . Sulfa Antibiotics      Past Medical History:  Diagnosis Date  . Depression   . Diabetes mellitus without complication ([place])   . Diverticulitis   . DVT (deep vein thrombosis) in pregnancy ([place]) 08/29/2017   Right popliteal and femoral  . GERD (gastroesophageal reflux disease)   . Hypercalcemia 10/22/2017  . Hyperlipidemia   . Hypertension   . Hypothyroidism   . Obesity   . Pancreatic cancer ([place]) 10/22/2017     Past Surgical History:  Procedure Laterality Date  . ABDOMINAL HYSTERECTOMY  2005  . CHOLECYSTECTOMY  2015  . COLONOSCOPY WITH PROPOFOL N/A 07/01/2015   Procedure: COLONOSCOPY WITH PROPOFOL;  Surgeon: Josefine Class, MD;  Location: [facility] ENDOSCOPY;  Service: Endoscopy;  Laterality: N/A;  . ESOPHAGOGASTRODUODENOSCOPY (EGD) WITH PROPOFOL N/A 07/01/2015   Procedure: ESOPHAGOGASTRODUODENOSCOPY (EGD) WITH PROPOFOL;  Surgeon: Josefine Class, MD;  Location: [facility] ENDOSCOPY;  Service: Endoscopy;  Laterality: N/A;  . ESOPHAGOGASTRODUODENOSCOPY (EGD) WITH PROPOFOL N/A 09/08/2017   Procedure: ESOPHAGOGASTRODUODENOSCOPY (EGD) WITH PROPOFOL;  Surgeon: Lucilla Lame, MD;  Location: [facility] ENDOSCOPY;  Service: Endoscopy;  Laterality: N/A;  . ESOPHAGOGASTRODUODENOSCOPY (EGD) WITH PROPOFOL N/A 09/21/2017   Procedure: ESOPHAGOGASTRODUODENOSCOPY (EGD) WITH PROPOFOL;  Surgeon: Lucilla Lame, MD;  Location: [facility] ENDOSCOPY;  Service: Endoscopy;  Laterality: N/A;    . ESOPHAGOGASTRODUODENOSCOPY (EGD) WITH PROPOFOL N/A 10/18/2017   Procedure: ESOPHAGOGASTRODUODENOSCOPY (EGD) WITH PROPOFOL;  Surgeon: [name], MD;  Location: [facility] ENDOSCOPY;  Service: Gastroenterology;  Laterality: N/A;  . IVC FILTER INSERTION N/A 09/08/2017   Procedure: IVC FILTER INSERTION;  Surgeon: Algernon Huxley, MD;  Location: [facility];  Service: Cardiovascular;  Laterality: N/A;    Social History   Socioeconomic History  . Marital status: Married    Spouse name: Not on file  . Number of children: Not on file  . Years of education: Not on file  . Highest education level: Not on file  Occupational History  . Not on file  Social Needs  . Financial resource strain: Not on file  . Food insecurity:    Worry: Not on file    Inability: Not on file  . Transportation needs:    Medical: Not on file    Non-medical: Not on file  Tobacco Use  . Smoking status: Never Smoker  . Smokeless tobacco: Never Used  Substance and Sexual Activity  . Alcohol use: No  . Drug use: No  . Sexual activity: Not on file  Lifestyle  . Physical activity:    Days per week: Not on file    Minutes per session: Not on file  . Stress: Not on file  Relationships  . Social connections:    Talks on phone: Not on file    Gets together: Not on file    Attends religious service: Not on file    Active member of club or organization: Not on file    Attends meetings of clubs or organizations: Not on  file    Relationship status: Not on file  . Intimate partner violence:    Fear of current or ex partner: Not on file    Emotionally abused: Not on file    Physically abused: Not on file    Forced sexual activity: Not on file  Other Topics Concern  . Not on file  Social History Narrative  . Not on file    Family History  Problem Relation Age of Onset  . Deep vein thrombosis Neg Hx      Current Outpatient Medications:  .  amoxicillin-clavulanate (AUGMENTIN) 400-57 MG/5ML suspension, Take  10 mLs (800 mg total) by mouth every 12 (twelve) hours for 10 days., Disp: 100 mL, Rfl: 0 .  dexamethasone (DECADRON) 0.5 MG/5ML solution, Take 20 mLs (2 mg total) by mouth daily., Disp: 100 mL, Rfl: 0 .  dronabinol (MARINOL) 5 MG capsule, Take 1 capsule (5 mg total) by mouth 2 (two) times daily before a meal., Disp: 30 capsule, Rfl: 0 .  fentaNYL (DURAGESIC - DOSED MCG/HR) 50 MCG/HR, Place 1 patch (50 mcg total) onto the skin every 3 (three) days., Disp: 10 patch, Rfl: 0 .  ferrous sulfate 325 (65 FE) MG EC tablet, Take 1 tablet (325 mg total) by mouth 2 (two) times daily., Disp: 60 tablet, Rfl: 1 .  gabapentin (NEURONTIN) 250 MG/5ML solution, Take 4 mLs (200 mg total) by mouth every 12 (twelve) hours., Disp: 300 mL, Rfl: 0 .  HYDROcodone-acetaminophen (HYCET) 7.5-325 mg/15 ml solution, Take 15 mLs by mouth every 3 (three) hours as needed for moderate pain or severe pain. (Patient not taking: Reported on 11/07/2017), Disp: 200 mL, Rfl: 0 .  levothyroxine (SYNTHROID, LEVOTHROID) 125 MCG tablet, Take 125 mcg by mouth daily before breakfast., Disp: , Rfl:  .  lidocaine (XYLOCAINE) 2 % solution, Use as directed 5 mLs in the mouth or throat 3 (three) times daily as needed for mouth pain., Disp: 100 mL, Rfl: 0 .  lisinopril (PRINIVIL,ZESTRIL) 20 MG tablet, Take 20 mg by mouth daily., Disp: , Rfl: 1 .  metoprolol succinate (TOPROL-XL) 100 MG 24 hr tablet, Take 50 mg by mouth daily. Take with or immediately following a meal. , Disp: , Rfl:  .  nystatin (MYCOSTATIN) 100000 UNIT/ML suspension, Take 5 mLs (500,000 Units total) by mouth 4 (four) times daily., Disp: 60 mL, Rfl: 0 .  omeprazole (PRILOSEC) 40 MG capsule, Take 1 capsule (40 mg total) by mouth 2 (two) times daily before a meal., Disp: 60 capsule, Rfl: 0 .  ondansetron (ZOFRAN) 8 MG tablet, Take 1 tablet (8 mg total) by mouth 2 (two) times daily as needed (Nausea or vomiting)., Disp: 30 tablet, Rfl: 1 .  pioglitazone (ACTOS) 45 MG tablet, Take 45 mg by  mouth daily., Disp: , Rfl: 0 .  potassium chloride (K-DUR) 10 MEQ tablet, Take 1 tablet (10 mEq total) by mouth daily., Disp: 10 tablet, Rfl: 0 .  protein supplement shake (PREMIER PROTEIN) LIQD, Take 325 mLs (11 oz total) by mouth daily., Disp: 30 Can, Rfl: 0 .  sennosides (SENOKOT) 8.8 MG/5ML syrup, Take 15 mLs by mouth 2 (two) times daily. (Patient not taking: Reported on 11/07/2017), Disp: 240 mL, Rfl: 0 .  sucralfate (CARAFATE) 1 g tablet, Take 1 tablet (1 g total) by mouth 4 (four) times daily., Disp: 120 tablet, Rfl: 5 .  TOUJEO SOLOSTAR 300 UNIT/ML SOPN, Inject 15 Units into the skin daily., Disp: 3 mL, Rfl: 4 .  venlafaxine XR (EFFEXOR-XR) 75  MG 24 hr capsule, Take 75 mg by mouth daily with breakfast., Disp: , Rfl:  No current facility-administered medications for this visit.   Facility-Administered Medications Ordered in Other Visits:  .  0.9 %  sodium chloride infusion, , Intravenous, Continuous, Burns, Wandra Feinstein, NP, Stopped at 11/14/17 1420  Physical exam:  Vitals:   11/14/17 1048  BP: 104/67  Pulse: 78  Resp: 20  Temp: 98 F (36.7 C)  TempSrc: Tympanic  SpO2: 96%   Physical Exam  Constitutional: She is oriented to person, place, and time and well-developed, well-nourished, and in no distress. Vital signs are normal. She appears dehydrated. She has a sickly appearance.  Very uncomfortable   HENT:  Head: Normocephalic and atraumatic.  Eyes: Pupils are equal, round, and reactive to light.  Neck: Normal range of motion. Neck supple.  Cardiovascular: Normal rate, regular rhythm and normal heart sounds.  No murmur heard. Pulmonary/Chest: Effort normal and breath sounds normal. She has no decreased breath sounds. She has no wheezes. She has no rhonchi. She has no rales.  Abdominal: Soft. Normal appearance and bowel sounds are normal. She exhibits no distension. There is no tenderness.  Musculoskeletal: Normal range of motion. She exhibits no edema.       Left knee: She  exhibits swelling and erythema.       Left ankle: She exhibits swelling.  Tight/ hot left leg- extending from foot to left thigh- approx 3 weeks and worsening- no open lesions or sores  Neurological: She is alert and oriented to person, place, and time. Gait normal.  Skin: Skin is warm and dry. Rash noted. There is erythema.  Psychiatric: Mood, memory, affect and judgment normal.     CMP Latest Ref Rng & Units 11/14/2017  Glucose 65 - 99 mg/dL 153(H)  BUN 6 - 20 mg/dL 9  Creatinine 0.44 - 1.00 mg/dL 0.92  Sodium 135 - 145 mmol/L 140  Potassium 3.5 - 5.1 mmol/L 3.1(L)  Chloride 101 - 111 mmol/L 103  CO2 22 - 32 mmol/L 21(L)  Calcium 8.9 - 10.3 mg/dL 8.6(L)  Total Protein 6.5 - 8.1 g/dL 7.3  Total Bilirubin 0.3 - 1.2 mg/dL 1.3(H)  Alkaline Phos 38 - 126 U/L 139(H)  AST 15 - 41 U/L 65(H)  ALT 14 - 54 U/L 42   CBC Latest Ref Rng & Units 11/14/2017  WBC 4.0 - 10.5 K/uL 13.7(H)  Hemoglobin 12.0 - 16.0 g/dL 8.5(L)  Hematocrit 35.0 - 47.0 % 26.3(L)  Platelets 150 - 440 K/uL 192    No images are attached to the encounter.  Nm Pulmonary Perf And Vent  Result Date: 10/16/2017 CLINICAL DATA:  Chest pain and shortness of breath, high clinical suspicion of pulmonary embolism EXAM: NUCLEAR MEDICINE VENTILATION - PERFUSION LUNG SCAN TECHNIQUE: Ventilation images were obtained in multiple projections using inhaled aerosol Tc-21m DTPA. Perfusion images were obtained in multiple projections after intravenous injection of Tc-20m-MAA. RADIOPHARMACEUTICALS:  34.104 mCi of Tc-48m DTPA aerosol inhalation and 4.008 mCi Tc66m-MAA IV COMPARISON:  None Correlation: Chest radiograph 10/15/2017 FINDINGS: Ventilation: No focal ventilation defects. Minimal central airway deposition of aerosol. Small amount of swallowed aerosol within stomach. Perfusion: Single small subsegmental perfusion defect at if RIGHT upper lobe. No other definite focal perfusion abnormalities. Chest radiograph: Clear lungs.  Normal heart  size. IMPRESSION: Very low probability for pulmonary embolism. Electronically Signed   By: Lavonia Dana M.D.   On: 10/16/2017 16:41   US Venous Img Lower Bilateral  Result Date: 10/17/2017 CLINICAL DATA:  Bilateral lower extremity pain and edema, right greater than left. History of prior DVT and IVC filter placement. EXAM: BILATERAL LOWER EXTREMITY VENOUS DOPPLER ULTRASOUND TECHNIQUE: Gray-scale sonography with graded compression, as well as color Doppler and duplex ultrasound were performed to evaluate the lower extremity deep venous systems from the level of the common femoral vein and including the common femoral, femoral, profunda femoral, popliteal and calf veins including the posterior tibial, peroneal and gastrocnemius veins when visible. The superficial great saphenous vein was also interrogated. Spectral Doppler was utilized to evaluate flow at rest and with distal augmentation maneuvers in the common femoral, femoral and popliteal veins. COMPARISON:  Right lower extremity venous Doppler - 09/23/2017; 09/17/2017; 08/29/2017; CT scan abdomen pelvis-10/16/2017 FINDINGS: RIGHT LOWER EXTREMITY There is mixed echogenic occlusive DVT extending from the right common femoral vein through the saphenofemoral junction, the imaged portions of the deep femoral vein, the proximal, mid and distal aspects of the right femoral vein, the popliteal vein and extending to involve the imaged portions of the tibial veins. Other Findings:  None. LEFT LOWER EXTREMITY There is mixed echogenic occlusive DVT extending from the left common femoral vein through the saphenofemoral junction, the imaged portions of the deep femoral vein, the proximal, mid and distal aspects of the left femoral vein, the popliteal vein and extending to involve the imaged portions of the tibial veins. Other Findings:  None. IMPRESSION: Examination is positive for extensive occlusive bilateral lower extremity DVT involving the entirety of the bilateral  lower extremity venous system. Note, bilateral pelvic venous and IVC thrombus was seen on preceding abdominal CT performed 10/16/2017. These results will be called to the ordering clinician or representative by the Radiologist Assistant, and communication documented in the PACS or zVision Dashboard. Electronically Signed   By: Sandi Mariscal M.D.   On: 10/17/2017 11:02   Ct Biopsy  Result Date: 10/17/2017 INDICATION: Right lower quadrant omental mass, unknown primary EXAM: CT-GUIDED BIOPSY RIGHT LOWER QUADRANT OMENTAL MASS MEDICATIONS: 1% LIDOCAINE LOCAL ANESTHESIA/SEDATION: 2.0 mg IV Versed; 75 mcg IV Fentanyl Moderate Sedation Time:  11 MINUTES The patient was continuously monitored during the procedure by the interventional radiology nurse under my direct supervision. PROCEDURE: The procedure, risks, benefits, and alternatives were explained to the patient. Questions regarding the procedure were encouraged and answered. The patient understands and consents to the procedure. Previous imaging reviewed. Patient positioned supine. Noncontrast localization CT performed. The right lower quadrant omental mass anterior to the cecum was localized. Overlying skin marked. Under sterile conditions and local anesthesia, a 17 gauge 6.8 cm access needle was advanced from a lateral approach to the right lower quadrant omental mass. Needle position confirmed with CT. 18 gauge core biopsies obtained. Samples placed in formalin. Needle removed. Postprocedure imaging demonstrates no hemorrhage or hematoma. Patient tolerated the procedure well without complication. Vital sign monitoring by nursing staff during the procedure will continue as patient is in the special procedures unit for post procedure observation. FINDINGS: The images document guide needle placement within the right lower quadrant omental mass. Post biopsy images demonstrate no hemorrhage or hematoma. COMPLICATIONS: None immediate. IMPRESSION: Successful CT-guided  core biopsy of the right lower quadrant omental mass Electronically Signed   By: Jerilynn Mages.  Shick M.D.   On: 10/17/2017 13:14   Dg Chest Port 1 View  Result Date: 10/29/2017 CLINICAL DATA:  Syncopal episode EXAM: PORTABLE CHEST 1 VIEW COMPARISON:  10/15/2017 FINDINGS: Right-sided PICC line is noted with the catheter tip in the superior aspect of the right atrium.  Lungs are well aerated bilaterally without focal infiltrate. Some nodular appearing densities are noted in the bases bilaterally similar to that seen on prior CT examination. No other focal abnormality is noted. IMPRESSION: Stable appearing metastatic disease within the lungs. PICC line as described. Electronically Signed   By: Inez Catalina M.D.   On: 10/29/2017 21:29   [place] Ekg Site Rite  Result Date: 10/26/2017 If Site Rite image not attached, placement could not be confirmed due to current cardiac rhythm.    Assessment and plan- Patient is a 62 y.o. female who presents for weakness, fatigue and worsening left lower extremity swelling.  1. Metastatic pancreatic cancer: Patient is status post cycle 1 day 14 of gemcitabine/Abraxane. Abraxane held due to declining performance status. Last dose 11/07/2017. Scheduled to return on 11/21/2017 for consideration of cycle 2 day 1.  2. Occlusive bilateral DVTs: Status post IVC filter placement inpatient in February 2019. Unable to anticoagulate due to recurrent GI bleeding. Patient is having extensive left lower extremity swelling, tightness and heat. At this point, she is unable to ambulate and requires significant help with ADLs. She is afebrile and has remained that way. Unfortunately, after speaking with Dr. Lucky Cowboy (Vascular surgeon), there is nothing further that he can do for the DVTs, given her pancreatic cancer is contributing. Spoke to hospitalist, while patient in office today to see if a direct admission was possible. After speaking extensively with hospitalist,  given her symptoms, they feel that  patient should be evaluated in the emergency room or if stable sent home with palliative/hospice services. Patient is not interested in hospice services at this time.   Patient will receive 1 L normal saline, 8 mg Zofran and 10 mg dexamethasone while in office today. Patient is unable to eat "anything". She complains of dysphagia. She has been drinking fluids well. Labs today revealed mild hypokalemia, corrected calcium of 9.5 and elevated phosphatase and worsening liver enzymes. It also exhibits hyperbilirubinemia. She is slightly anemic with a hemoglobin of 8.5 but denies any active bleeding. Slight neutrophilia with mild left shift indicating possible infection. Will add on blood cultures. Patient was treated for cellulitis inpatient for 5 days with amoxicillin, will place patient back on amoxicillin liquid solution for 10 additional days.  Vital signs remained stable throughout visit. Patient reevaluated after fluids, Zofran and dexamethasone. Patient felt "better". " Leg is still bothering her". She complains of "mild pain and left leg but mostly discomfort".  Patient requesting to go home. Consulted with Dr. Grayland Ormond and if patient stable, okay to send home at this time. Will send in prescription for amoxicillin liquid solution and 2 mg dexamethasone liquid solution daily for swelling, appetite stimulant and fatigue.   Spoke to her husband and sister regarding possible palliative services to come out to help treat symptoms and possibly give fluids if needed at home, given it is very hard to get her in and out of the house. Will speak with Georgeanne Nim, NP with palliative services.   Visit Diagnosis 1. Cellulitis and abscess of left leg   2. Neutrophilia   3. Malignant neoplasm of pancreas, unspecified location of malignancy ([place])   4. Acute deep vein thrombosis (DVT) of femoral vein of both lower extremities ([facility])     Patient expressed understanding and was in agreement with this plan. She  also understands that She can call clinic at any time with any questions, concerns, or complaints.   Greater than 50% was spent in counseling and coordination of care  with this patient including but not limited to discussion of the relevant topics above (See A&P) including, but not limited to diagnosis and management of acute and chronic medical conditions.    Faythe Casa, AGNP-C [facility]- [phone] Pager- [phone] 11/15/2017 8:11 AM

## 2017-11-15 MED ORDER — AMOXICILLIN-POT CLAVULANATE 400-57 MG/5ML PO SUSR: 800.0000 mg | Freq: Two times a day (BID) | ORAL | 0 refills | Status: AC

## 2017-11-19 ENCOUNTER — Telehealth: Payer: Self-pay | Admitting: *Deleted

## 2017-11-19 NOTE — Telephone Encounter (Signed)
Daughter called to report that patient does not want chemotherapy Wednesday but would like to have IV fluids as her last appointment. I advised that she is already down for chemotherapy and IV fluids and to keep appointment as scheduled

## 2017-11-20 ENCOUNTER — Other Ambulatory Visit: Payer: Self-pay | Admitting: Oncology

## 2017-11-20 DIAGNOSIS — C259 Malignant neoplasm of pancreas, unspecified: Secondary | ICD-10-CM

## 2017-11-21 ENCOUNTER — Encounter: Payer: Self-pay | Admitting: Oncology

## 2017-11-21 ENCOUNTER — Inpatient Hospital Stay: Payer: BLUE CROSS/BLUE SHIELD | Attending: Oncology

## 2017-11-21 ENCOUNTER — Inpatient Hospital Stay: Payer: BLUE CROSS/BLUE SHIELD

## 2017-11-21 ENCOUNTER — Inpatient Hospital Stay (HOSPITAL_BASED_OUTPATIENT_CLINIC_OR_DEPARTMENT_OTHER): Payer: BLUE CROSS/BLUE SHIELD | Admitting: Oncology

## 2017-11-21 VITALS — BP 152/95 | HR 91 | Temp 97.2°F | Resp 18 | Wt 222.1 lb

## 2017-11-21 DIAGNOSIS — Z86718 Personal history of other venous thrombosis and embolism: Secondary | ICD-10-CM | POA: Diagnosis not present

## 2017-11-21 DIAGNOSIS — C259 Malignant neoplasm of pancreas, unspecified: Secondary | ICD-10-CM

## 2017-11-21 DIAGNOSIS — Z5111 Encounter for antineoplastic chemotherapy: Secondary | ICD-10-CM | POA: Diagnosis present

## 2017-11-21 DIAGNOSIS — Z79899 Other long term (current) drug therapy: Secondary | ICD-10-CM

## 2017-11-21 DIAGNOSIS — F419 Anxiety disorder, unspecified: Secondary | ICD-10-CM

## 2017-11-21 DIAGNOSIS — E46 Unspecified protein-calorie malnutrition: Secondary | ICD-10-CM

## 2017-11-21 DIAGNOSIS — C7800 Secondary malignant neoplasm of unspecified lung: Secondary | ICD-10-CM | POA: Insufficient documentation

## 2017-11-21 DIAGNOSIS — R634 Abnormal weight loss: Secondary | ICD-10-CM | POA: Insufficient documentation

## 2017-11-21 DIAGNOSIS — D500 Iron deficiency anemia secondary to blood loss (chronic): Secondary | ICD-10-CM

## 2017-11-21 DIAGNOSIS — K250 Acute gastric ulcer with hemorrhage: Secondary | ICD-10-CM | POA: Diagnosis not present

## 2017-11-21 DIAGNOSIS — C787 Secondary malignant neoplasm of liver and intrahepatic bile duct: Secondary | ICD-10-CM

## 2017-11-21 DIAGNOSIS — E876 Hypokalemia: Secondary | ICD-10-CM

## 2017-11-21 DIAGNOSIS — M7989 Other specified soft tissue disorders: Secondary | ICD-10-CM | POA: Insufficient documentation

## 2017-11-21 DIAGNOSIS — I82413 Acute embolism and thrombosis of femoral vein, bilateral: Secondary | ICD-10-CM

## 2017-11-21 DIAGNOSIS — K5903 Drug induced constipation: Secondary | ICD-10-CM | POA: Insufficient documentation

## 2017-11-21 LAB — COMPREHENSIVE METABOLIC PANEL
ALK PHOS: 113 U/L (ref 38–126)
ALT: 32 U/L (ref 14–54)
ANION GAP: 13 (ref 5–15)
AST: 41 U/L (ref 15–41)
Albumin: 3 g/dL — ABNORMAL LOW (ref 3.5–5.0)
BILIRUBIN TOTAL: 1 mg/dL (ref 0.3–1.2)
BUN: 6 mg/dL (ref 6–20)
CALCIUM: 9.6 mg/dL (ref 8.9–10.3)
CO2: 22 mmol/L (ref 22–32)
Chloride: 99 mmol/L — ABNORMAL LOW (ref 101–111)
Creatinine, Ser: 0.74 mg/dL (ref 0.44–1.00)
GFR calc non Af Amer: >60 mL/min (ref >60)
GLUCOSE: 171 mg/dL — AB (ref 65–99)
Potassium: 3.9 mmol/L (ref 3.5–5.1)
Sodium: 134 mmol/L — ABNORMAL LOW (ref 135–145)
Total Protein: 8 g/dL (ref 6.5–8.1)

## 2017-11-21 LAB — CBC WITH DIFFERENTIAL/PLATELET
BASOS ABS: 0.1 10*3/uL (ref 0–0.1)
Basophils Relative: 0 %
EOS ABS: 0.1 10*3/uL (ref 0–0.7)
Eosinophils Relative: 1 %
HEMATOCRIT: 31.9 % — AB (ref 35.0–47.0)
Hemoglobin: 10.4 g/dL — ABNORMAL LOW (ref 12.0–16.0)
Lymphocytes Relative: 12 %
Lymphs Abs: 1.4 10*3/uL (ref 1.0–3.6)
MCH: 31.1 pg (ref 26.0–34.0)
MCHC: 32.7 g/dL (ref 32.0–36.0)
MCV: 95.2 fL (ref 80.0–100.0)
MONO ABS: 0.8 10*3/uL (ref 0.2–0.9)
Monocytes Relative: 7 %
NEUTROS ABS: 9.5 10*3/uL — AB (ref 1.4–6.5)
NEUTROS PCT: 80 %
Platelets: 442 10*3/uL — ABNORMAL HIGH (ref 150–440)
RBC: 3.35 MIL/uL — ABNORMAL LOW (ref 3.80–5.20)
RDW: 21.2 % — AB (ref 11.5–14.5)
WBC: 11.8 10*3/uL — ABNORMAL HIGH (ref 3.6–11.0)

## 2017-11-21 LAB — MAGNESIUM: MAGNESIUM: 1.9 mg/dL (ref 1.7–2.4)

## 2017-11-21 MED ORDER — ALPRAZOLAM 0.5 MG PO TABS: 0.5000 mg | ORAL_TABLET | Freq: Two times a day (BID) | ORAL | 0 refills | Status: DC | PRN

## 2017-11-21 MED ORDER — SODIUM CHLORIDE 0.9 % IV SOLN
Freq: Once | INTRAVENOUS | Status: AC
  Administered 2017-11-21: 10:00:00 via INTRAVENOUS
  Filled 2017-11-21: qty 1000

## 2017-11-21 MED ORDER — SODIUM CHLORIDE 0.9 % IV SOLN
2200.0000 mg | Freq: Once | INTRAVENOUS | Status: AC
  Administered 2017-11-21: 2200 mg via INTRAVENOUS
  Filled 2017-11-21: qty 52.6

## 2017-11-21 MED ORDER — HEPARIN SOD (PORK) LOCK FLUSH 100 UNIT/ML IV SOLN
500.0000 [IU] | Freq: Once | INTRAVENOUS | Status: AC | PRN
  Administered 2017-11-21: 250 [IU]
  Filled 2017-11-21: qty 5

## 2017-11-21 MED ORDER — PROCHLORPERAZINE MALEATE 10 MG PO TABS
10.0000 mg | ORAL_TABLET | Freq: Once | ORAL | Status: DC
  Filled 2017-11-21: qty 1

## 2017-11-21 MED ORDER — SODIUM CHLORIDE 0.9% FLUSH
10.0000 mL | INTRAVENOUS | Status: DC | PRN
  Administered 2017-11-21: 10 mL
  Filled 2017-11-21: qty 10

## 2017-11-21 MED ORDER — ONDANSETRON HCL 4 MG/2ML IJ SOLN
8.0000 mg | Freq: Once | INTRAMUSCULAR | Status: AC
  Administered 2017-11-21: 8 mg via INTRAVENOUS
  Filled 2017-11-21: qty 4

## 2017-11-21 MED ORDER — SODIUM CHLORIDE 0.9 % IV SOLN: Freq: Once | INTRAVENOUS | Status: DC

## 2017-11-21 NOTE — Progress Notes (Signed)
B/P 152/95 per Dr. Tasia Catchings, proceed with Gemzar and one liter NS over one hour. Pt report she can not swallow Compazine pill. Per Dr. Tasia Catchings okay to give Zofran 8mg  IVP instead. Pt educated to take B/P medications as prescribed. Pt verbalizes understanding.

## 2017-11-21 NOTE — Progress Notes (Signed)
Pt in for follow up, reports having no appetite, using marinol but does not see any improvement.  Had 1/2 a premier protein shake this morning, nothing to ear yesterday.  Pt states pain is control with duragesic patch.  Pt is concerned with the amount of anxiety she is having and requested something to help with it.  Reports not sleeping well, wakes up often thinking about diagnosis and her situation.

## 2017-11-21 NOTE — Progress Notes (Signed)
Hematology/Oncology Follow Up Note [facility]  [phone] [phone]  Patient Care Team: Lorelee Market, MD as PCP - General (Family Medicine)   Name of the patient: Ann Larson  [id number]  02-Aug-1956   REASON FOR VISIT Follow up hospital discharge.  HISTORY OF PRESENT ILLNESS Ann Larson is a  62 y.o.  female with PMH listed below who was referred to me for follow up after hospital discharge. I saw patient during her recent admission.  This is a 62 year old female with history of  right lower extremity DVT in January 2019, GI bleeding due to anticoagulation, status post IVC filter who presents to emergency room with episode of epigastric abdominal pain, shortness of breath, fatigue, and near syncope.  Hemoglobin was found to be 6.6.  Denies seeing blood in the stool or having black stool. # 08/29/2017.She developed right lower extremity occlusive cough and popliteal DVT extending into the distal femoral vein on  She was started on Xarelto and had subsequent GI bleeding.  On 1/ 28/2019 she presented with symptomatic anemia, GI bleeding.  She had another follow-up US venous lower extremity done,which showed propagation of deep vein thrombosis of the right lower extremity now involving right common femoral and femoral veins in addition to previously identified right popliteal and calf veins.  #EGD showed gastritis with hemorrhage, treated with argon plasma coagulation.  Anticoagulation was held and patient had IVC filter placed. [name] was hold.  09/23/17 She had a repeat right lower extremity venous Doppler done which showed occlusive DVT of the right common femoral, superficial femoral, profunda femoral, popliteal, posterior tibial  Veins. # 10/15/2017 Patient continues to have right thigh pain and discomfort and abdominal pain which triggered a CT abdomen pelvis angiogram which showed extensive near complete occlusive thrombus  extending from the common femoral vein to the IVC at the level of IVC filter, no definite thrombus noted above I VC, enlarged distal body and tail of the pancreas likely from extension of the previously seen pancreatic mass, metastatic disease including omental implant, splenic lesions, pulmonary nodules.  Small a situs.  Mildly enlarged porta hepatic lymph node as well as slightly rounded retroperitoneal and periaortic lymph nodes.  Focal ill-defined heterogeneous enhancement of the left lobe of the liver. # Biopsy of omental nodule pathology revealed metastatic pancreatic cancer, CA 19.9 is 124,491, consistent with metastatic pancreatic cancer.   Current Treatment   10/25/2017  Cycle 1 Gemzar and Abraxane  11/22/2017  Switch to single agent Gemzar   Current Pain regimen:  fentanyl patch 36mcg/h Q72 h  liquid hypdrocodone acetaminophen 7.5/325mg  PRN  INTERVAL HISTORY Patient presents for assessment prior to chemotherapy.  Patient is status post second dose of Gemzar.  Today she is here for third dose of Gemzar.  Last week she was seen by symptom management nurse practitioner.  She reports feeling very fatigued, lack of appetite, lower extremity swelling and pain.  She has expressed her wishes of not willing to receive any more chemotherapy. Today she reports feeling a lot better, lower extremity swelling and pain has improved.  She uses fentanyl patch 50 mcg/h every 72 hours. Still very poor appetite not eating very well.  She uses Marinol 5 mg daily Reports feeling anxious, especially at night, prior to coming for chemotherapy.  She tells me that she has changed her mind and wants to receive more chemotherapy treatments.  Opiate induced constipation: yes, Senna 2 tablets daily, and Miralax PRN Pain:1 out of 10  Review of systems Review of Systems  Constitutional: Positive for malaise/fatigue and weight loss. Negative for chills and fever.  HENT: Negative for ear discharge and nosebleeds.     Eyes: Negative for blurred vision, double vision, photophobia and pain.  Respiratory: Negative for cough, sputum production and shortness of breath.   Cardiovascular: Positive for leg swelling. Negative for chest pain and palpitations.  Gastrointestinal: Negative for blood in stool, nausea and vomiting.  Genitourinary: Negative for dysuria.  Musculoskeletal: Negative for back pain, myalgias and neck pain.  Skin: Negative for rash.  Neurological: Positive for weakness. Negative for dizziness, tingling, tremors, seizures and headaches.  Endo/Heme/Allergies: Negative for environmental allergies. Does not bruise/bleed easily.  Psychiatric/Behavioral: Negative for depression and substance abuse. The patient is nervous/anxious.     Allergies  Allergen Reactions  . Sulfa Antibiotics      Past Medical History:  Diagnosis Date  . Depression   . Diabetes mellitus without complication ([place])   . Diverticulitis   . DVT (deep vein thrombosis) in pregnancy ([place]) 08/29/2017   Right popliteal and femoral  . GERD (gastroesophageal reflux disease)   . Hypercalcemia 10/22/2017  . Hyperlipidemia   . Hypertension   . Hypothyroidism   . Obesity   . Pancreatic cancer ([place]) 10/22/2017     Past Surgical History:  Procedure Laterality Date  . ABDOMINAL HYSTERECTOMY  2005  . CHOLECYSTECTOMY  2015  . COLONOSCOPY WITH PROPOFOL N/A 07/01/2015   Procedure: COLONOSCOPY WITH PROPOFOL;  Surgeon: Josefine Class, MD;  Location: [facility] ENDOSCOPY;  Service: Endoscopy;  Laterality: N/A;  . ESOPHAGOGASTRODUODENOSCOPY (EGD) WITH PROPOFOL N/A 07/01/2015   Procedure: ESOPHAGOGASTRODUODENOSCOPY (EGD) WITH PROPOFOL;  Surgeon: Josefine Class, MD;  Location: [facility] ENDOSCOPY;  Service: Endoscopy;  Laterality: N/A;  . ESOPHAGOGASTRODUODENOSCOPY (EGD) WITH PROPOFOL N/A 09/08/2017   Procedure: ESOPHAGOGASTRODUODENOSCOPY (EGD) WITH PROPOFOL;  Surgeon: Lucilla Lame, MD;  Location: [facility] ENDOSCOPY;  Service: Endoscopy;   Laterality: N/A;  . ESOPHAGOGASTRODUODENOSCOPY (EGD) WITH PROPOFOL N/A 09/21/2017   Procedure: ESOPHAGOGASTRODUODENOSCOPY (EGD) WITH PROPOFOL;  Surgeon: Lucilla Lame, MD;  Location: [facility] ENDOSCOPY;  Service: Endoscopy;  Laterality: N/A;  . ESOPHAGOGASTRODUODENOSCOPY (EGD) WITH PROPOFOL N/A 10/18/2017   Procedure: ESOPHAGOGASTRODUODENOSCOPY (EGD) WITH PROPOFOL;  Surgeon: [name], MD;  Location: [facility] ENDOSCOPY;  Service: Gastroenterology;  Laterality: N/A;  . IVC FILTER INSERTION N/A 09/08/2017   Procedure: IVC FILTER INSERTION;  Surgeon: Algernon Huxley, MD;  Location: [place] CV LAB;  Service: Cardiovascular;  Laterality: N/A;    Social History   Socioeconomic History  . Marital status: Married    Spouse name: Not on file  . Number of children: Not on file  . Years of education: Not on file  . Highest education level: Not on file  Occupational History  . Not on file  Social Needs  . Financial resource strain: Not on file  . Food insecurity:    Worry: Not on file    Inability: Not on file  . Transportation needs:    Medical: Not on file    Non-medical: Not on file  Tobacco Use  . Smoking status: Never Smoker  . Smokeless tobacco: Never Used  Substance and Sexual Activity  . Alcohol use: No  . Drug use: No  . Sexual activity: Not on file  Lifestyle  . Physical activity:    Days per week: Not on file    Minutes per session: Not on file  . Stress: Not on file  Relationships  . Social connections:    Talks  on phone: Not on file    Gets together: Not on file    Attends religious service: Not on file    Active member of club or organization: Not on file    Attends meetings of clubs or organizations: Not on file    Relationship status: Not on file  . Intimate partner violence:    Fear of current or ex partner: Not on file    Emotionally abused: Not on file    Physically abused: Not on file    Forced sexual activity: Not on file  Other Topics Concern  . Not on file    Social History Narrative  . Not on file    Family History  Problem Relation Age of Onset  . Deep vein thrombosis Neg Hx      Current Outpatient Medications:  .  amoxicillin-clavulanate (AUGMENTIN) 400-57 MG/5ML suspension, Take 10 mLs (800 mg total) by mouth every 12 (twelve) hours for 10 days., Disp: 100 mL, Rfl: 0 .  dexamethasone (DECADRON) 0.5 MG/5ML solution, Take 20 mLs (2 mg total) by mouth daily., Disp: 100 mL, Rfl: 0 .  dronabinol (MARINOL) 5 MG capsule, Take 1 capsule (5 mg total) by mouth 2 (two) times daily before a meal., Disp: 30 capsule, Rfl: 0 .  fentaNYL (DURAGESIC - DOSED MCG/HR) 50 MCG/HR, Place 1 patch (50 mcg total) onto the skin every 3 (three) days., Disp: 10 patch, Rfl: 0 .  ferrous sulfate 325 (65 FE) MG EC tablet, Take 1 tablet (325 mg total) by mouth 2 (two) times daily., Disp: 60 tablet, Rfl: 1 .  gabapentin (NEURONTIN) 250 MG/5ML solution, Take 4 mLs (200 mg total) by mouth every 12 (twelve) hours., Disp: 300 mL, Rfl: 0 .  HYDROcodone-acetaminophen (HYCET) 7.5-325 mg/15 ml solution, Take 15 mLs by mouth every 3 (three) hours as needed for moderate pain or severe pain. (Patient not taking: Reported on 11/07/2017), Disp: 200 mL, Rfl: 0 .  levothyroxine (SYNTHROID, LEVOTHROID) 125 MCG tablet, Take 125 mcg by mouth daily before breakfast., Disp: , Rfl:  .  lidocaine (XYLOCAINE) 2 % solution, Use as directed 5 mLs in the mouth or throat 3 (three) times daily as needed for mouth pain., Disp: 100 mL, Rfl: 0 .  lisinopril (PRINIVIL,ZESTRIL) 20 MG tablet, Take 20 mg by mouth daily., Disp: , Rfl: 1 .  metoprolol succinate (TOPROL-XL) 100 MG 24 hr tablet, Take 50 mg by mouth daily. Take with or immediately following a meal. , Disp: , Rfl:  .  nystatin (MYCOSTATIN) 100000 UNIT/ML suspension, Take 5 mLs (500,000 Units total) by mouth 4 (four) times daily., Disp: 60 mL, Rfl: 0 .  omeprazole (PRILOSEC) 40 MG capsule, Take 1 capsule (40 mg total) by mouth 2 (two) times daily  before a meal., Disp: 60 capsule, Rfl: 0 .  ondansetron (ZOFRAN) 8 MG tablet, Take 1 tablet (8 mg total) by mouth 2 (two) times daily as needed (Nausea or vomiting)., Disp: 30 tablet, Rfl: 1 .  pioglitazone (ACTOS) 45 MG tablet, Take 45 mg by mouth daily., Disp: , Rfl: 0 .  potassium chloride (K-DUR) 10 MEQ tablet, Take 1 tablet (10 mEq total) by mouth daily., Disp: 10 tablet, Rfl: 0 .  protein supplement shake (PREMIER PROTEIN) LIQD, Take 325 mLs (11 oz total) by mouth daily., Disp: 30 Can, Rfl: 0 .  sennosides (SENOKOT) 8.8 MG/5ML syrup, Take 15 mLs by mouth 2 (two) times daily. (Patient not taking: Reported on 11/07/2017), Disp: 240 mL, Rfl: 0 .  sucralfate (  CARAFATE) 1 g tablet, Take 1 tablet (1 g total) by mouth 4 (four) times daily., Disp: 120 tablet, Rfl: 5 .  TOUJEO SOLOSTAR 300 UNIT/ML SOPN, Inject 15 Units into the skin daily., Disp: 3 mL, Rfl: 4 .  venlafaxine XR (EFFEXOR-XR) 75 MG 24 hr capsule, Take 75 mg by mouth daily with breakfast., Disp: , Rfl:  No current facility-administered medications for this visit.   Facility-Administered Medications Ordered in Other Visits:  .  0.9 %  sodium chloride infusion, , Intravenous, Continuous, Burns, Wandra Feinstein, NP, Stopped at 11/14/17 1420  Physical exam:  Vitals:   11/21/17 0906 11/21/17 0922  BP: (!) 152/95   Pulse: 91   Resp: 18   Temp: (!) 97.2 F (36.2 C)   TempSrc: Tympanic   Weight:  222 lb 1 oz (100.7 kg)    ECOG 2 Physical Exam  Constitutional: She is oriented to person, place, and time and well-developed, well-nourished, and in no distress. No distress.  HENT:  Head: Normocephalic and atraumatic.  Mouth/Throat: Oropharynx is clear and moist. No oropharyngeal exudate.  Eyes: Pupils are equal, round, and reactive to light. Left eye exhibits no discharge. No scleral icterus.  Neck: Normal range of motion. Neck supple.  Cardiovascular: Normal rate, regular rhythm and normal heart sounds.  No murmur heard. Pulmonary/Chest:  Effort normal and breath sounds normal. She has no wheezes. She has no rales.  Abdominal: Bowel sounds are normal. She exhibits no mass. There is no rebound and no guarding.  .   Musculoskeletal: Normal range of motion. She exhibits edema. She exhibits no deformity.  Bilateral lower extremity 3+ edema. Right upper extremity picc line, no RUE edema.   Lymphadenopathy:    She has no cervical adenopathy.  Neurological: She is alert and oriented to person, place, and time. No cranial nerve deficit.  Skin: Skin is warm and dry. No rash noted. There is erythema.  Bilateral lower extremity skin erythematous change.  Psychiatric: Mood, affect and judgment normal.    CMP Latest Ref Rng & Units 11/14/2017  Glucose 65 - 99 mg/dL 153(H)  BUN 6 - 20 mg/dL 9  Creatinine 0.44 - 1.00 mg/dL 0.92  Sodium 135 - 145 mmol/L 140  Potassium 3.5 - 5.1 mmol/L 3.1(L)  Chloride 101 - 111 mmol/L 103  CO2 22 - 32 mmol/L 21(L)  Calcium 8.9 - 10.3 mg/dL 8.6(L)  Total Protein 6.5 - 8.1 g/dL 7.3  Total Bilirubin 0.3 - 1.2 mg/dL 1.3(H)  Alkaline Phos 38 - 126 U/L 139(H)  AST 15 - 41 U/L 65(H)  ALT 14 - 54 U/L 42   CBC Latest Ref Rng & Units 11/14/2017  WBC 4.0 - 10.5 K/uL 13.7(H)  Hemoglobin 12.0 - 16.0 g/dL 8.5(L)  Hematocrit 35.0 - 47.0 % 26.3(L)  Platelets 150 - 440 K/uL 192    Dg Chest Port 1 View  Result Date: 10/29/2017 CLINICAL DATA:  Syncopal episode EXAM: PORTABLE CHEST 1 VIEW COMPARISON:  10/15/2017 FINDINGS: Right-sided PICC line is noted with the catheter tip in the superior aspect of the right atrium. Lungs are well aerated bilaterally without focal infiltrate. Some nodular appearing densities are noted in the bases bilaterally similar to that seen on prior CT examination. No other focal abnormality is noted. IMPRESSION: Stable appearing metastatic disease within the lungs. PICC line as described. Electronically Signed   By: Inez Catalina M.D.   On: 10/29/2017 21:29   [place] Ekg Site Rite  Result Date:  10/26/2017 If [employer] not  attached, placement could not be confirmed due to current cardiac rhythm.     Assessment and plan- Patient is a61 y.o.femalewith history of hemorraghic gastritis, extensivebilaterallower extremity DVT not on anticoagulation, newly diagnosed metastatic pancreatic cancer presents for follow up.   1. Malignant neoplasm of pancreas, unspecified location of malignancy ([place])   2. Acute deep vein thrombosis (DVT) of femoral vein of both lower extremities ([facility])   3. Iron deficiency anemia due to chronic blood loss   4. Acute gastric ulcer with hemorrhage   5. Hypercalcemia   6. Weight loss   7. Anxiety    # Metastatic pancreatic cancer,  involving lung, liver, omental caking. S/p one treatment of Gemzar and Abraxane, and another dose of single agent Gemzar treatment.  Counts are acceptable for chemotherapy. Proceed with cycle 2 Day 1 Gemcitabine today. Given her performance status and multiple ongoing issue,  I will hold Abraxane and only proceed with single agent Gemcitabine. Hopefully if her performance status improves, plan to add abraxane.   # Hypercalcemia secondary to metastatic pancreatic cancer, S/p Zometa  3mg  x 1 on 3/7, calcium stable today.  # symptomatic anemia secondary to GI blood loss, continue monitor, S/p EGD which revealed bleeding ulcer, treated with homospray.  She has also received IV iron infusion, Hemoglobin has improved.     # Iron deficiency anemia secondary to blood loss: see above.   # Extensivebilaterallower extremity DVT: anticoagulation is contraindicated at this point due to ongoing blood loss. DVT likely related to her metastatic tumor burden.    # Hypokalemia: resolved.  #Weight loss/poor oral intake procedure was 1 L of IV normal saline today.  Continue Marinol, advised patient to increase to 5 mg twice daily. Continue Ensure twice daily.  She has been prescribed with.Decadrone and reports not feeling well after using  decadron. Will stop. Advise  # Anxiety: start Xanax 0.5mg  BID as needed.   Follow up visit: 1 week to see me prior to next treatment of Gemzar.  Total face to face encounter time for this patient visit was 45 min. >50% of the time was  spent in counseling and coordination of care.  Earlie Server, MD, PhD Hematology Oncology [facility] Pager- [phone] 11/21/2017

## 2017-11-21 NOTE — Progress Notes (Signed)
Per MD note: #Metastatic pancreatic cancer, involving lung, liver, omental caking. S/p one treatment of Gemzar and Abraxane, missed Day 8 treatment due to hospitalization.  Proceed with cycle 2 Day 1 Gemcitabine today. Given her performance status and multiple ongoing issue,  I will hold Abraxane and only proceed with single agent Gemcitabine. Hopefully if her performance status improves, plan to add abraxane

## 2017-11-22 LAB — CANCER ANTIGEN 19-9: CA 19-9: 31036 U/mL — ABNORMAL HIGH (ref 0–35)

## 2017-11-26 ENCOUNTER — Telehealth: Payer: Self-pay | Admitting: *Deleted

## 2017-11-26 NOTE — Telephone Encounter (Signed)
Patient requesting to go on Hospice Services. If in agreement, please fax orders to Hospice

## 2017-11-26 NOTE — Telephone Encounter (Signed)
Form filled out, Dr Tasia Catchings Wants to wait until she comes in this week to discuss this with patient, Judson Roch informed and will discuss with patient. Apparently, patient is having to pay copay with home health and is wanting more services and hopes that hospice can provide services with less copay

## 2017-11-28 ENCOUNTER — Inpatient Hospital Stay (HOSPITAL_BASED_OUTPATIENT_CLINIC_OR_DEPARTMENT_OTHER): Payer: BLUE CROSS/BLUE SHIELD | Admitting: Oncology

## 2017-11-28 ENCOUNTER — Inpatient Hospital Stay: Payer: BLUE CROSS/BLUE SHIELD

## 2017-11-28 ENCOUNTER — Encounter: Payer: Self-pay | Admitting: Oncology

## 2017-11-28 ENCOUNTER — Telehealth: Payer: Self-pay | Admitting: *Deleted

## 2017-11-28 VITALS — BP 160/76 | HR 92 | Temp 97.6°F | Resp 18 | Wt 212.4 lb

## 2017-11-28 DIAGNOSIS — D500 Iron deficiency anemia secondary to blood loss (chronic): Secondary | ICD-10-CM

## 2017-11-28 DIAGNOSIS — C259 Malignant neoplasm of pancreas, unspecified: Secondary | ICD-10-CM

## 2017-11-28 DIAGNOSIS — C7800 Secondary malignant neoplasm of unspecified lung: Secondary | ICD-10-CM

## 2017-11-28 DIAGNOSIS — Z86718 Personal history of other venous thrombosis and embolism: Secondary | ICD-10-CM

## 2017-11-28 DIAGNOSIS — I82413 Acute embolism and thrombosis of femoral vein, bilateral: Secondary | ICD-10-CM

## 2017-11-28 DIAGNOSIS — C787 Secondary malignant neoplasm of liver and intrahepatic bile duct: Secondary | ICD-10-CM

## 2017-11-28 DIAGNOSIS — Z7189 Other specified counseling: Secondary | ICD-10-CM

## 2017-11-28 DIAGNOSIS — Z5111 Encounter for antineoplastic chemotherapy: Secondary | ICD-10-CM

## 2017-11-28 DIAGNOSIS — G4700 Insomnia, unspecified: Secondary | ICD-10-CM

## 2017-11-28 DIAGNOSIS — R634 Abnormal weight loss: Secondary | ICD-10-CM

## 2017-11-28 DIAGNOSIS — F419 Anxiety disorder, unspecified: Secondary | ICD-10-CM

## 2017-11-28 LAB — CBC WITH DIFFERENTIAL/PLATELET
BASOS ABS: 0 10*3/uL (ref 0–0.1)
Basophils Relative: 0 %
EOS PCT: 1 %
Eosinophils Absolute: 0 10*3/uL (ref 0–0.7)
HCT: 24.1 % — ABNORMAL LOW (ref 35.0–47.0)
HEMOGLOBIN: 8.2 g/dL — AB (ref 12.0–16.0)
LYMPHS PCT: 18 %
Lymphs Abs: 1.2 10*3/uL (ref 1.0–3.6)
MCH: 32.3 pg (ref 26.0–34.0)
MCHC: 34.1 g/dL (ref 32.0–36.0)
MCV: 94.9 fL (ref 80.0–100.0)
Monocytes Absolute: 0.6 10*3/uL (ref 0.2–0.9)
Monocytes Relative: 10 %
NEUTROS ABS: 4.7 10*3/uL (ref 1.4–6.5)
NEUTROS PCT: 71 %
PLATELETS: 322 10*3/uL (ref 150–440)
RBC: 2.54 MIL/uL — AB (ref 3.80–5.20)
RDW: 22.2 % — ABNORMAL HIGH (ref 11.5–14.5)
WBC: 6.6 10*3/uL (ref 3.6–11.0)

## 2017-11-28 LAB — COMPREHENSIVE METABOLIC PANEL
ALT: 20 U/L (ref 14–54)
ANION GAP: 10 (ref 5–15)
AST: 27 U/L (ref 15–41)
Albumin: 2.4 g/dL — ABNORMAL LOW (ref 3.5–5.0)
Alkaline Phosphatase: 81 U/L (ref 38–126)
CHLORIDE: 101 mmol/L (ref 101–111)
CO2: 25 mmol/L (ref 22–32)
Calcium: 9.2 mg/dL (ref 8.9–10.3)
Creatinine, Ser: 0.72 mg/dL (ref 0.44–1.00)
Glucose, Bld: 154 mg/dL — ABNORMAL HIGH (ref 65–99)
POTASSIUM: 3.3 mmol/L — AB (ref 3.5–5.1)
Sodium: 136 mmol/L (ref 135–145)
Total Bilirubin: 0.9 mg/dL (ref 0.3–1.2)
Total Protein: 7.1 g/dL (ref 6.5–8.1)

## 2017-11-28 LAB — IRON AND TIBC
Iron: 34 ug/dL (ref 28–170)
SATURATION RATIOS: 14 % (ref 10.4–31.8)
TIBC: 251 ug/dL (ref 250–450)
UIBC: 217 ug/dL

## 2017-11-28 LAB — FERRITIN: Ferritin: 1389 ng/mL — ABNORMAL HIGH (ref 11–307)

## 2017-11-28 MED ORDER — SODIUM CHLORIDE 0.9 % IV SOLN: Freq: Once | INTRAVENOUS | Status: DC

## 2017-11-28 MED ORDER — SODIUM CHLORIDE 0.9 % IV SOLN
Freq: Once | INTRAVENOUS | Status: AC
  Administered 2017-11-28: 10:00:00 via INTRAVENOUS
  Filled 2017-11-28: qty 1000

## 2017-11-28 MED ORDER — ONDANSETRON HCL 4 MG/2ML IJ SOLN
8.0000 mg | Freq: Once | INTRAMUSCULAR | Status: AC
  Administered 2017-11-28: 8 mg via INTRAVENOUS
  Filled 2017-11-28: qty 4

## 2017-11-28 MED ORDER — LORAZEPAM 0.5 MG PO TABS: 0.5000 mg | ORAL_TABLET | Freq: Three times a day (TID) | ORAL | 0 refills | Status: DC

## 2017-11-28 MED ORDER — SODIUM CHLORIDE 0.9 % IV SOLN
2200.0000 mg | Freq: Once | INTRAVENOUS | Status: AC
  Administered 2017-11-28: 2200 mg via INTRAVENOUS
  Filled 2017-11-28: qty 52.6

## 2017-11-28 MED ORDER — SODIUM CHLORIDE 0.9 % IV SOLN
INTRAVENOUS | Status: DC
  Administered 2017-11-28: 11:00:00 via INTRAVENOUS
  Filled 2017-11-28 (×2): qty 250

## 2017-11-28 MED ORDER — HEPARIN SOD (PORK) LOCK FLUSH 100 UNIT/ML IV SOLN
250.0000 [IU] | Freq: Once | INTRAVENOUS | Status: AC | PRN
  Administered 2017-11-28: 250 [IU]
  Filled 2017-11-28: qty 5

## 2017-11-28 MED ORDER — POTASSIUM CHLORIDE IN NACL 20-0.9 MEQ/L-% IV SOLN: INTRAVENOUS | Status: DC

## 2017-11-28 MED ORDER — SODIUM CHLORIDE 0.9 % IV SOLN
INTRAVENOUS | Status: DC
  Filled 2017-11-28: qty 1000

## 2017-11-28 NOTE — Addendum Note (Signed)
Addended by: Earlie Server on: 11/28/2017 02:51 PM   Modules accepted: Orders

## 2017-11-28 NOTE — Progress Notes (Signed)
Pt in for follow up and treatment today.  Reports had been doing well until yesterday.  Pt states she ate some grapes and ever since has been very nauseous.  Pt reports not sleeping well and needing something to help.  Pt also reports that she has a great deal of anxiety and agitation and is unable to take xanax because it made her itch all over her body.

## 2017-11-28 NOTE — Telephone Encounter (Signed)
I sent Ativan to Pharmacy, not xanax.

## 2017-11-28 NOTE — Telephone Encounter (Signed)
Dr Tasia Catchings sent xanax to pharmacy

## 2017-11-28 NOTE — Telephone Encounter (Signed)
Received a call from an unidentified caller with a bad connection. Something was said about medicine was supposed to be sent ot pharmacy for patient, but was not there. Please advise

## 2017-11-28 NOTE — Progress Notes (Signed)
Hematology/Oncology Follow Up Note [facility]  Telephone:([phone] [phone]  Patient Care Team: Lorelee Market, MD as PCP - General (Family Medicine)   Name of the patient: Ann Larson  [id number]  31-Oct-1955   REASON FOR VISIT Follow up hospital discharge.  HISTORY OF PRESENT ILLNESS Ann Larson is a  62 y.o.  female with PMH listed below who was referred to me for follow up after hospital discharge. I saw patient during her recent admission.  This is a 62 year old female with history of  right lower extremity DVT in January 2019, GI bleeding due to anticoagulation, status post IVC filter who presents to emergency room with episode of epigastric abdominal pain, shortness of breath, fatigue, and near syncope.  Hemoglobin was found to be 6.6.  Denies seeing blood in the stool or having black stool. # 08/29/2017.She developed right lower extremity occlusive cough and popliteal DVT extending into the distal femoral vein on  She was started on Xarelto and had subsequent GI bleeding.  On 1/ 28/2019 she presented with symptomatic anemia, GI bleeding.  She had another follow-up US venous lower extremity done,which showed propagation of deep vein thrombosis of the right lower extremity now involving right common femoral and femoral veins in addition to previously identified right popliteal and calf veins.  #EGD showed gastritis with hemorrhage, treated with argon plasma coagulation.  Anticoagulation was held and patient had IVC filter placed. [name] was hold.  09/23/17 She had a repeat right lower extremity venous Doppler done which showed occlusive DVT of the right common femoral, superficial femoral, profunda femoral, popliteal, posterior tibial  Veins. # 10/15/2017 Patient continues to have right thigh pain and discomfort and abdominal pain which triggered a CT abdomen pelvis angiogram which showed extensive near complete occlusive thrombus  extending from the common femoral vein to the IVC at the level of IVC filter, no definite thrombus noted above I VC, enlarged distal body and tail of the pancreas likely from extension of the previously seen pancreatic mass, metastatic disease including omental implant, splenic lesions, pulmonary nodules.  Small a situs.  Mildly enlarged porta hepatic lymph node as well as slightly rounded retroperitoneal and periaortic lymph nodes.  Focal ill-defined heterogeneous enhancement of the left lobe of the liver. # Biopsy of omental nodule pathology revealed metastatic pancreatic cancer, CA 19.9 is 124,491, consistent with metastatic pancreatic cancer.   Current Treatment   10/25/2017  Cycle 1 Gemzar and Abraxane  11/21/2017  Switch to single agent Gemzar  11/28/2017 Single agent Gemzar.   Current Pain regimen:  fentanyl patch 66mcg/h Q72 h  liquid hypdrocodone acetaminophen 7.5/325mg  PRN  INTERVAL HISTORY Patient presents for assessment prior to chemotherapy.  She reports that she has not feels a whole lot better.  Reports not able to sleep at night weak and feeling anxious. She does not eat very well only takes Marinol 5 mg daily.  She was previously advised to increase Marinol to 5 mg twice daily.  She says she will do it from today. Denies seeing any black stool or blood in the stool.  Denies any stomach pain, nausea or vomiting.  Reports chronic fatigue. Today is her cycle 2-day 8 single agent Gemzar treatment.  Lower extremity swelling, left more than right, slightly better compared to previously.  She uses fentanyl patch 50 mcg/h every 72 hours.  The pain is well controlled. She was given trials of Xanax which she reports feeling itchiness after taking Xanax.  So she stopped  taking it and request alternatives for her insomnia and anxiety. During the interval she has called and talked to triage nurse Hassan Rowan.  She requests hospice care.  when Opiate induced constipation: yes, Senna 2 tablets daily,  and Miralax PRN Pain:1 out of 10  Pain regimen: 50 mcg/h every 72 hours  Review of systems Review of Systems  Constitutional: Positive for weight loss. Negative for chills and fever.  HENT: Negative for ear discharge and nosebleeds.   Eyes: Negative for blurred vision, double vision, photophobia and pain.  Respiratory: Negative for cough, sputum production and shortness of breath.   Cardiovascular: Negative for chest pain, palpitations and orthopnea.  Gastrointestinal: Negative for blood in stool, nausea and vomiting.  Genitourinary: Negative for dysuria.  Musculoskeletal: Negative for back pain, myalgias and neck pain.  Skin: Negative for rash.  Neurological: Positive for weakness. Negative for dizziness, tingling, tremors, seizures and headaches.  Endo/Heme/Allergies: Negative for environmental allergies. Does not bruise/bleed easily.  Psychiatric/Behavioral: Negative for depression, hallucinations, substance abuse and suicidal ideas. The patient is nervous/anxious and has insomnia.     Allergies  Allergen Reactions  . Sulfa Antibiotics   . Xanax [Alprazolam]     Pt experienced itching all over body     Past Medical History:  Diagnosis Date  . Depression   . Diabetes mellitus without complication ([place])   . Diverticulitis   . DVT (deep vein thrombosis) in pregnancy ([place]) 08/29/2017   Right popliteal and femoral  . GERD (gastroesophageal reflux disease)   . Hypercalcemia 10/22/2017  . Hyperlipidemia   . Hypertension   . Hypothyroidism   . Obesity   . Pancreatic cancer ([place]) 10/22/2017     Past Surgical History:  Procedure Laterality Date  . ABDOMINAL HYSTERECTOMY  2005  . CHOLECYSTECTOMY  2015  . COLONOSCOPY WITH PROPOFOL N/A 07/01/2015   Procedure: COLONOSCOPY WITH PROPOFOL;  Surgeon: Josefine Class, MD;  Location[facility] ENDOSCOPY;  Service: Endoscopy;  Laterality: N/A;  . ESOPHAGOGASTRODUODENOSCOPY (EGD) WITH PROPOFOL N/A 07/01/2015   Procedure:  ESOPHAGOGASTRODUODENOSCOPY (EGD) WITH PROPOFOL;  Surgeon: Josefine Class, MD;  Location: [facility] ENDOSCOPY;  Service: Endoscopy;  Laterality: N/A;  . ESOPHAGOGASTRODUODENOSCOPY (EGD) WITH PROPOFOL N/A 09/08/2017   Procedure: ESOPHAGOGASTRODUODENOSCOPY (EGD) WITH PROPOFOL;  Surgeon: Lucilla Lame, MD;  Location: [facility] ENDOSCOPY;  Service: Endoscopy;  Laterality: N/A;  . ESOPHAGOGASTRODUODENOSCOPY (EGD) WITH PROPOFOL N/A 09/21/2017   Procedure: ESOPHAGOGASTRODUODENOSCOPY (EGD) WITH PROPOFOL;  Surgeon: Lucilla Lame, MD;  Location: [facility] ENDOSCOPY;  Service: Endoscopy;  Laterality: N/A;  . ESOPHAGOGASTRODUODENOSCOPY (EGD) WITH PROPOFOL N/A 10/18/2017   Procedure: ESOPHAGOGASTRODUODENOSCOPY (EGD) WITH PROPOFOL;  Surgeon: [name], MD;  Location: [facility] ENDOSCOPY;  Service: Gastroenterology;  Laterality: N/A;  . IVC FILTER INSERTION N/A 09/08/2017   Procedure: IVC FILTER INSERTION;  Surgeon: Algernon Huxley, MD;  Location: [facility] LAB;  Service: Cardiovascular;  Laterality: N/A;    Social History   Socioeconomic History  . Marital status: Married    Spouse name: Not on file  . Number of children: Not on file  . Years of education: Not on file  . Highest education level: Not on file  Occupational History  . Not on file  Social Needs  . Financial resource strain: Not on file  . Food insecurity:    Worry: Not on file    Inability: Not on file  . Transportation needs:    Medical: Not on file    Non-medical: Not on file  Tobacco Use  . Smoking status: Never Smoker  .  Smokeless tobacco: Never Used  Substance and Sexual Activity  . Alcohol use: No  . Drug use: No  . Sexual activity: Not on file  Lifestyle  . Physical activity:    Days per week: Not on file    Minutes per session: Not on file  . Stress: Not on file  Relationships  . Social connections:    Talks on phone: Not on file    Gets together: Not on file    Attends religious service: Not on file    Active member of club or  organization: Not on file    Attends meetings of clubs or organizations: Not on file    Relationship status: Not on file  . Intimate partner violence:    Fear of current or ex partner: Not on file    Emotionally abused: Not on file    Physically abused: Not on file    Forced sexual activity: Not on file  Other Topics Concern  . Not on file  Social History Narrative  . Not on file    Family History  Problem Relation Age of Onset  . Deep vein thrombosis Neg Hx      Current Outpatient Medications:  .  dronabinol (MARINOL) 5 MG capsule, Take 1 capsule (5 mg total) by mouth 2 (two) times daily before a meal., Disp: 30 capsule, Rfl: 0 .  fentaNYL (DURAGESIC - DOSED MCG/HR) 50 MCG/HR, Place 1 patch (50 mcg total) onto the skin every 3 (three) days., Disp: 10 patch, Rfl: 0 .  gabapentin (NEURONTIN) 250 MG/5ML solution, Take 4 mLs (200 mg total) by mouth every 12 (twelve) hours., Disp: 300 mL, Rfl: 0 .  levothyroxine (SYNTHROID, LEVOTHROID) 125 MCG tablet, Take 125 mcg by mouth daily before breakfast., Disp: , Rfl:  .  lidocaine (XYLOCAINE) 2 % solution, Use as directed 5 mLs in the mouth or throat 3 (three) times daily as needed for mouth pain., Disp: 100 mL, Rfl: 0 .  lisinopril (PRINIVIL,ZESTRIL) 20 MG tablet, Take 20 mg by mouth daily., Disp: , Rfl: 1 .  metoprolol succinate (TOPROL-XL) 100 MG 24 hr tablet, Take 50 mg by mouth daily. Take with or immediately following a meal. , Disp: , Rfl:  .  omeprazole (PRILOSEC) 40 MG capsule, Take 1 capsule (40 mg total) by mouth 2 (two) times daily before a meal., Disp: 60 capsule, Rfl: 0 .  ondansetron (ZOFRAN) 8 MG tablet, Take 1 tablet (8 mg total) by mouth 2 (two) times daily as needed (Nausea or vomiting)., Disp: 30 tablet, Rfl: 1 .  pioglitazone (ACTOS) 45 MG tablet, Take 45 mg by mouth daily., Disp: , Rfl: 0 .  potassium chloride (K-DUR) 10 MEQ tablet, Take 1 tablet (10 mEq total) by mouth daily., Disp: 10 tablet, Rfl: 0 .  protein supplement  shake (PREMIER PROTEIN) LIQD, Take 325 mLs (11 oz total) by mouth daily., Disp: 30 Can, Rfl: 0 .  sucralfate (CARAFATE) 1 g tablet, Take 1 tablet (1 g total) by mouth 4 (four) times daily., Disp: 120 tablet, Rfl: 5 .  venlafaxine XR (EFFEXOR-XR) 75 MG 24 hr capsule, Take 75 mg by mouth daily with breakfast., Disp: , Rfl:  .  ferrous sulfate 325 (65 FE) MG EC tablet, Take 1 tablet (325 mg total) by mouth 2 (two) times daily., Disp: 60 tablet, Rfl: 1 .  HYDROcodone-acetaminophen (HYCET) 7.5-325 mg/15 ml solution, Take 15 mLs by mouth every 3 (three) hours as needed for moderate pain or severe pain. (Patient not  taking: Reported on 11/07/2017), Disp: 200 mL, Rfl: 0 .  nystatin (MYCOSTATIN) 100000 UNIT/ML suspension, Take 5 mLs (500,000 Units total) by mouth 4 (four) times daily. (Patient not taking: Reported on 11/21/2017), Disp: 60 mL, Rfl: 0 .  sennosides (SENOKOT) 8.8 MG/5ML syrup, Take 15 mLs by mouth 2 (two) times daily. (Patient not taking: Reported on 11/07/2017), Disp: 240 mL, Rfl: 0 .  TOUJEO SOLOSTAR 300 UNIT/ML SOPN, Inject 15 Units into the skin daily. (Patient not taking: Reported on 11/21/2017), Disp: 3 mL, Rfl: 4 No current facility-administered medications for this visit.   Facility-Administered Medications Ordered in Other Visits:  .  0.9 %  sodium chloride infusion, , Intravenous, Continuous, Burns, Wandra Feinstein, NP, Stopped at 11/14/17 1420 .  heparin lock flush 100 unit/mL, 250 Units, Intracatheter, Once PRN, Earlie Server, MD .  sodium chloride 0.9 % 250 mL with potassium chloride 20 mEq infusion, , Intravenous, Continuous, Earlie Server, MD, Last Rate: 130 mL/hr at 11/28/17 1045  Physical exam:  Vitals:   11/28/17 0929  BP: (!) 160/76  Pulse: 92  Resp: 18  Temp: 97.6 F (36.4 C)  TempSrc: Tympanic  Weight: 212 lb 6 oz (96.3 kg)    ECOG 2 Physical Exam  Constitutional: She is oriented to person, place, and time and well-developed, well-nourished, and in no distress. No distress.  HENT:    Head: Atraumatic.  Mouth/Throat: Oropharynx is clear and moist. No oropharyngeal exudate.  Eyes: Pupils are equal, round, and reactive to light. Left eye exhibits no discharge. No scleral icterus.  Neck: Normal range of motion. Neck supple.  Cardiovascular: Normal rate, regular rhythm and normal heart sounds.  No murmur heard. Pulmonary/Chest: Effort normal and breath sounds normal. She has no wheezes. She has no rales.  Abdominal: Bowel sounds are normal. She exhibits no mass. There is no rebound and no guarding.  .   Musculoskeletal: Normal range of motion. She exhibits edema. She exhibits no deformity.  Bilateral lower extremity 3+ edema. L>R.  Right upper extremity picc line, no RUE edema.   Lymphadenopathy:    She has no cervical adenopathy.  Neurological: She is alert and oriented to person, place, and time. No cranial nerve deficit.  Skin: Skin is warm and dry. No rash noted. There is erythema.  Bilateral lower extremity skin erythematous change.  Psychiatric: Mood and affect normal.    CMP Latest Ref Rng & Units 11/28/2017  Glucose 65 - 99 mg/dL 154(H)  BUN 6 - 20 mg/dL <5(L)  Creatinine 0.44 - 1.00 mg/dL 0.72  Sodium 135 - 145 mmol/L 136  Potassium 3.5 - 5.1 mmol/L 3.3(L)  Chloride 101 - 111 mmol/L 101  CO2 22 - 32 mmol/L 25  Calcium 8.9 - 10.3 mg/dL 9.2  Total Protein 6.5 - 8.1 g/dL 7.1  Total Bilirubin 0.3 - 1.2 mg/dL 0.9  Alkaline Phos 38 - 126 U/L 81  AST 15 - 41 U/L 27  ALT 14 - 54 U/L 20   CBC Latest Ref Rng & Units 11/28/2017  WBC 3.6 - 11.0 K/uL 6.6  Hemoglobin 12.0 - 16.0 g/dL 8.2(L)  Hematocrit 35.0 - 47.0 % 24.1(L)  Platelets 150 - 440 K/uL 322    Dg Chest Port 1 View  Result Date: 10/29/2017 CLINICAL DATA:  Syncopal episode EXAM: PORTABLE CHEST 1 VIEW COMPARISON:  10/15/2017 FINDINGS: Right-sided PICC line is noted with the catheter tip in the superior aspect of the right atrium. Lungs are well aerated bilaterally without focal infiltrate. Some nodular  appearing  densities are noted in the bases bilaterally similar to that seen on prior CT examination. No other focal abnormality is noted. IMPRESSION: Stable appearing metastatic disease within the lungs. PICC line as described. Electronically Signed   By: Inez Catalina M.D.   On: 10/29/2017 21:29      Assessment and plan- Patient is a48 y.o.femalewith history of hemorraghic gastritis, extensivebilaterallower extremity DVT not on anticoagulation, newly diagnosed metastatic pancreatic cancer presents for follow up.   1. Encounter for antineoplastic chemotherapy   2. Iron deficiency anemia due to chronic blood loss   3. Acute deep vein thrombosis (DVT) of femoral vein of both lower extremities (HCC)   4. Pancreatic cancer metastasized to lung ([place])   5. Weight loss   6. Goals of care, counseling/discussion   #Goal of care: I had a lengthy discussion with patient and family members. Explained the concept of hospice which is supportive care only without treatment meant to prolong life. After knowing what hospice means, she expresses interest of knowing more and maybe having a tour of hospice facility if possible. I will refer her to see outpatient Palliative care and discuss further. At this point, patient still wants to receive additional treatment.   # Metastatic pancreatic cancer,  involving lung, liver, omental caking.  Counts are acceptable for chemotherapy. Clinically she is responding to treatment with significantly decreased of CA19.9 from 124,491 to 31,036. Pain appears to be better with current regimen.  Will proceed with cycle 2 Day 8 Gemcitabine today. Given her performance status and multiple ongoing issue, continue to  Abraxane and only proceed with single agent Gemcitabine. Hopefully if her performance status improves, plan to add abraxane.   # Hypercalcemia secondary to metastatic pancreatic cancer, S/p Zometa  3mg  x 1 on 3/7, calcium stable today.  # symptomatic anemia secondary  to GI blood loss, continue monitor, S/p EGD which revealed bleeding ulcer, treated with homospray.  She has also received IV iron infusion, Hemoglobin has improved at last visit, however further decreased today. Will check her iron level. Anemia can be a combination of due to Gemzar or ongoing blood loss from GI tract.    # Iron deficiency anemia secondary to blood loss: see above.   # Extensivebilaterallower extremity DVT: anticoagulation is contraindicated at this point due to ongoing blood loss. DVT likely related to her metastatic tumor burden. Swelling has improved. I don't think she has cellulitis, the erythematous skin change is due to her extensive LE DVT.  She can stop taking antibiotics.   # Hypokalemia: continue KCL 71meq oral daily, will give KCL 66meq IV x1  #Weight loss/poor oral intake Continue Marinol, advised patient to increase to 5 mg twice daily. Dietitian referral  Continue Ensure twice daily.   # Anxiety: does not want to take Xanax due to skin pruritis without rash. Switch to Ativan 0,5mg  Q6h PRN.    Follow up visit: 1 week to see me prior to next treatment of Gemzar.  Total face to face encounter time for this patient visit was 45 min. >50% of the time was  spent in counseling and coordination of care.   Earlie Server, MD, PhD Hematology Oncology [facility] at [facility] Pager- [phone] 11/28/2017

## 2017-12-03 ENCOUNTER — Other Ambulatory Visit: Payer: Self-pay | Admitting: *Deleted

## 2017-12-03 DIAGNOSIS — C259 Malignant neoplasm of pancreas, unspecified: Secondary | ICD-10-CM

## 2017-12-03 MED ORDER — FENTANYL 50 MCG/HR TD PT72: 50.0000 ug | MEDICATED_PATCH | TRANSDERMAL | 0 refills | Status: DC

## 2017-12-05 ENCOUNTER — Inpatient Hospital Stay: Payer: BLUE CROSS/BLUE SHIELD | Admitting: Oncology

## 2017-12-05 ENCOUNTER — Inpatient Hospital Stay: Payer: BLUE CROSS/BLUE SHIELD

## 2017-12-05 ENCOUNTER — Telehealth: Payer: Self-pay | Admitting: Oncology

## 2017-12-05 NOTE — Telephone Encounter (Signed)
Received fwd v/m from Brenda/Triage with patient requesting to cancel Lab/MD/CHEMO for 12/05/17. Msg sent to Dr. Tasia Catchings and team.

## 2017-12-06 ENCOUNTER — Telehealth: Payer: Self-pay | Admitting: *Deleted

## 2017-12-06 ENCOUNTER — Other Ambulatory Visit: Payer: Self-pay | Admitting: Oncology

## 2017-12-06 MED ORDER — ZOLPIDEM TARTRATE 5 MG PO TABS: 5.0000 mg | ORAL_TABLET | Freq: Every evening | ORAL | 0 refills | Status: DC | PRN

## 2017-12-06 NOTE — Telephone Encounter (Signed)
Sarah informed of doctor response and repeated back to me

## 2017-12-06 NOTE — Telephone Encounter (Signed)
Will order ambien 5mg  QHS as needed. Advise not to use together with Ativan.  I will not increase Ativan dose due to the concern of overdose.

## 2017-12-06 NOTE — Telephone Encounter (Signed)
Home Health nurse called reporting that patient has anxiety and insomnia ands is requesting an increase of her Ativan and a sleeping pill. Please advise

## 2017-12-19 ENCOUNTER — Telehealth: Payer: Self-pay | Admitting: *Deleted

## 2017-12-19 NOTE — Telephone Encounter (Signed)
Left message on voice mail for Ann Larson that ok to hold Actos

## 2017-12-19 NOTE — Telephone Encounter (Signed)
Nurse called to report that patient has been having low BS and that she is on Actos. Asking if she can stop the Actos as she is not really eating much and only has 1 ensure a day. Please advise

## 2017-12-19 NOTE — Telephone Encounter (Signed)
Yes. She can. Thanks.

## 2017-12-25 ENCOUNTER — Telehealth: Payer: Self-pay | Admitting: *Deleted

## 2017-12-25 NOTE — Telephone Encounter (Signed)
Since she is not getting any IV medication, picc line can be removed.

## 2017-12-25 NOTE — Telephone Encounter (Signed)
Patient has a PICC line, Hospice has no orders related to caring for it. Asking if you want it flushed, how often how much and dressing changes, or do you want it removed since she is not getting anything through it. Please advise

## 2017-12-25 NOTE — Telephone Encounter (Signed)
Ann Larson informed taht PICC line can be removed and she will check to see if Hospice can remove it and call me back if they do not

## 2018-01-02 ENCOUNTER — Other Ambulatory Visit: Payer: Self-pay | Admitting: *Deleted

## 2018-01-02 DIAGNOSIS — C259 Malignant neoplasm of pancreas, unspecified: Secondary | ICD-10-CM

## 2018-01-02 MED ORDER — FENTANYL 50 MCG/HR TD PT72: 50.0000 ug | MEDICATED_PATCH | TRANSDERMAL | 0 refills | Status: DC

## 2018-01-08 ENCOUNTER — Telehealth: Payer: Self-pay | Admitting: *Deleted

## 2018-01-08 NOTE — Telephone Encounter (Signed)
Per Dr Tasia Catchings, her readings are not that bad, but if her heart rate is >60, she can increase metoprolol to 100 mg and she is to monitor bp and if she gets dizzy or her bp drops go back to 50 mg dosing. Magda Paganini informed and repeated back to me

## 2018-01-08 NOTE — Telephone Encounter (Signed)
Hospice nurse called to report that patient is having increased B/P over weekend especially. Ranging 163-188/100. Lorazepam did not help bring it down. She is currently on Metoprolol 100 mg 1/2 tablet daily and nurse asking if that can be increased to a who;e tablet daily, Please advise

## 2018-01-10 ENCOUNTER — Inpatient Hospital Stay: Payer: BLUE CROSS/BLUE SHIELD | Attending: Oncology | Admitting: Oncology

## 2018-01-21 ENCOUNTER — Other Ambulatory Visit: Payer: Self-pay | Admitting: *Deleted

## 2018-01-21 DIAGNOSIS — C259 Malignant neoplasm of pancreas, unspecified: Secondary | ICD-10-CM

## 2018-01-21 MED ORDER — FENTANYL 50 MCG/HR TD PT72: 50.0000 ug | MEDICATED_PATCH | TRANSDERMAL | 0 refills | Status: DC

## 2018-01-21 NOTE — Telephone Encounter (Signed)
Spoke with Hospice nurse, Magda Paganini.  Last reading that was done by leslie was on 01/16/18 with a reading of 122/80.  Per Dr Tasia Catchings keep Metoprolol at 50mg  daily, check BP once weekly.

## 2018-01-21 NOTE — Telephone Encounter (Signed)
Nurse called and reports that patient continues to be hypertensive b/p running 188/90 and that is with the increase in her metoprolol to 1 tablet. Please advise

## 2018-01-25 ENCOUNTER — Ambulatory Visit (INDEPENDENT_AMBULATORY_CARE_PROVIDER_SITE_OTHER): Payer: BLUE CROSS/BLUE SHIELD | Admitting: Vascular Surgery

## 2018-01-25 ENCOUNTER — Encounter (INDEPENDENT_AMBULATORY_CARE_PROVIDER_SITE_OTHER): Payer: BLUE CROSS/BLUE SHIELD

## 2018-01-31 ENCOUNTER — Other Ambulatory Visit: Payer: Self-pay | Admitting: *Deleted

## 2018-01-31 DIAGNOSIS — D500 Iron deficiency anemia secondary to blood loss (chronic): Secondary | ICD-10-CM

## 2018-01-31 DIAGNOSIS — C259 Malignant neoplasm of pancreas, unspecified: Secondary | ICD-10-CM

## 2018-01-31 MED ORDER — LORAZEPAM 0.5 MG PO TABS: 0.5000 mg | ORAL_TABLET | Freq: Three times a day (TID) | ORAL | 0 refills | Status: DC

## 2018-01-31 MED ORDER — FENTANYL 50 MCG/HR TD PT72: 50.0000 ug | MEDICATED_PATCH | TRANSDERMAL | 0 refills | Status: DC

## 2018-01-31 NOTE — Telephone Encounter (Signed)
Magda Paganini called form Hospice and states patient has decided she wants to be a full code and does not want her PICC line removed so they need flushing orders. Patient also wants labs drawn to see how she is doing and wants to speak with Dr Tasia Catchings, asking if she should just make an appointment for lab and physician. Please advise

## 2018-02-01 ENCOUNTER — Telehealth: Payer: Self-pay | Admitting: Oncology

## 2018-02-01 NOTE — Addendum Note (Signed)
Addended by: Betti Cruz on: 02/01/2018 09:13 AM   Modules accepted: Orders

## 2018-02-01 NOTE — Telephone Encounter (Signed)
Per Dr Tasia Catchings, schedule patient appointment for lab/ physician CBC, CMP, CA 19-9. Message sent to scheduling to call  Patient with appointment. Call to [facility] at [facility] to let her know about appointment.

## 2018-02-01 NOTE — Telephone Encounter (Signed)
Received scheduling message from Ann Larson that pt wanted to see the Dr. Erenest Rasher. Per convo with pt she did not want to see Dr. Tasia Catchings, she only wanted to have labs drawn. Blair Heys to send order to Hospice for them to draw. Pt is currently under their care.

## 2018-02-05 ENCOUNTER — Telehealth: Payer: Self-pay | Admitting: *Deleted

## 2018-02-05 NOTE — Telephone Encounter (Signed)
Please let patient know that if she remains on Hospice, no labs need to be drawn. Focus is comfort care and symptom management.  If she no longer wants hospice, I can see her in clinic and order labs.

## 2018-02-05 NOTE — Telephone Encounter (Signed)
Magda Paganini, hospice nurse called asking what labs need to be drawn, they never received orders as per office note dated 02/01/18

## 2018-02-05 NOTE — Telephone Encounter (Signed)
Ann Larson informed of no orders will be given fopr labs to be drawn at this time an dif patient changes her mind she can make appointment to see doctor and have labs drawn in office

## 2018-02-08 ENCOUNTER — Telehealth: Payer: Self-pay | Admitting: *Deleted

## 2018-02-08 NOTE — Telephone Encounter (Signed)
Per Dr Janese Banks, no changes in medications at this time. Magda Paganini inmformed

## 2018-02-08 NOTE — Telephone Encounter (Signed)
Hospice nurse called back and reports that patient is on Effexor already, asking if you want to add something else or go up on does of Effexor. Please advise

## 2018-02-08 NOTE — Telephone Encounter (Signed)
Patient requesting medicine for anxiety that will not make her drowsy. Perhaps Lexapro. Please advise

## 2018-02-13 ENCOUNTER — Telehealth: Payer: Self-pay | Admitting: *Deleted

## 2018-02-13 NOTE — Telephone Encounter (Signed)
Hospice nurse called stating patient wants to schedule an appointment to see Dr Tasia Catchings on a Wed and that she states she called to get appointment and no one has returned her call. Please call patient [phone]

## 2018-02-13 NOTE — Telephone Encounter (Signed)
(  HOSPICE Patient) MD appt, per patient request. Appt schd & conf w patient for Wednesday 02/27/18 @ 2:15 pm. MF

## 2018-02-19 ENCOUNTER — Other Ambulatory Visit: Payer: Self-pay | Admitting: *Deleted

## 2018-02-19 DIAGNOSIS — C259 Malignant neoplasm of pancreas, unspecified: Secondary | ICD-10-CM

## 2018-02-19 MED ORDER — LORAZEPAM 0.5 MG PO TABS: 0.5000 mg | ORAL_TABLET | Freq: Three times a day (TID) | ORAL | 0 refills | Status: AC

## 2018-02-19 MED ORDER — FENTANYL 50 MCG/HR TD PT72: 50.0000 ug | MEDICATED_PATCH | TRANSDERMAL | 0 refills | Status: DC

## 2018-02-22 ENCOUNTER — Ambulatory Visit
Admission: RE | Admit: 2018-02-22 | Discharge: 2018-02-22 | Disposition: A | Discharge status: Home / Self Care | Payer: BLUE CROSS/BLUE SHIELD | Source: Ambulatory Visit | Attending: Oncology | Admitting: Oncology

## 2018-02-22 ENCOUNTER — Other Ambulatory Visit: Payer: Self-pay

## 2018-02-22 ENCOUNTER — Inpatient Hospital Stay: Payer: BLUE CROSS/BLUE SHIELD | Attending: Oncology | Admitting: Oncology

## 2018-02-22 ENCOUNTER — Encounter: Payer: Self-pay | Admitting: Oncology

## 2018-02-22 ENCOUNTER — Inpatient Hospital Stay: Payer: BLUE CROSS/BLUE SHIELD

## 2018-02-22 VITALS — BP 138/83 | HR 71 | Temp 96.5°F | Resp 16 | Ht 70.0 in | Wt 174.4 lb

## 2018-02-22 DIAGNOSIS — Z7189 Other specified counseling: Secondary | ICD-10-CM

## 2018-02-22 DIAGNOSIS — F329 Major depressive disorder, single episode, unspecified: Secondary | ICD-10-CM | POA: Diagnosis not present

## 2018-02-22 DIAGNOSIS — R140 Abdominal distension (gaseous): Secondary | ICD-10-CM | POA: Insufficient documentation

## 2018-02-22 DIAGNOSIS — C259 Malignant neoplasm of pancreas, unspecified: Secondary | ICD-10-CM

## 2018-02-22 DIAGNOSIS — Z79899 Other long term (current) drug therapy: Secondary | ICD-10-CM | POA: Insufficient documentation

## 2018-02-22 DIAGNOSIS — M7989 Other specified soft tissue disorders: Secondary | ICD-10-CM | POA: Insufficient documentation

## 2018-02-22 DIAGNOSIS — R0602 Shortness of breath: Secondary | ICD-10-CM | POA: Diagnosis not present

## 2018-02-22 DIAGNOSIS — R5383 Other fatigue: Secondary | ICD-10-CM | POA: Insufficient documentation

## 2018-02-22 DIAGNOSIS — G893 Neoplasm related pain (acute) (chronic): Secondary | ICD-10-CM | POA: Diagnosis not present

## 2018-02-22 DIAGNOSIS — E119 Type 2 diabetes mellitus without complications: Secondary | ICD-10-CM

## 2018-02-22 DIAGNOSIS — R918 Other nonspecific abnormal finding of lung field: Secondary | ICD-10-CM | POA: Diagnosis not present

## 2018-02-22 DIAGNOSIS — R630 Anorexia: Secondary | ICD-10-CM

## 2018-02-22 DIAGNOSIS — Z86718 Personal history of other venous thrombosis and embolism: Secondary | ICD-10-CM

## 2018-02-22 DIAGNOSIS — R1011 Right upper quadrant pain: Secondary | ICD-10-CM

## 2018-02-22 LAB — APTT: APTT: 53 s — AB (ref 24–36)

## 2018-02-22 LAB — PROTIME-INR
INR: 1.02
Prothrombin Time: 13.3 seconds (ref 11.4–15.2)

## 2018-02-22 NOTE — Progress Notes (Signed)
Hematology/Oncology Follow Up Note [facility]  Telephone:([phone] [phone]  Patient Care Team: Lorelee Market, MD as PCP - General (Family Medicine)   Name of the patient: Ann Larson  [id number]  [birth year]   REASON FOR VISIT Requested by patient for follow up evaluation of metastatic pancreatic disease.   INTERVAL HISTORY Patient presents for follow up of her metastatic pancreatic disease. She was last seen in the office in April, s/p 1 cycle of Gemcitabine/Abraxane and decline any further chemotherapy treatment and has been on home hospice since then.  Patient requests to be seen and evaluated.   Neoplasm pain: uses fentanyl 79mcg Q 3 days. Reports recently having right side abdominal pain, associated with abdominal distension.  Lower extremity swelling has improved.  GI bleeding: denies any melena, blood in stool.  Fatigue: reports worsening fatigue. Chronic onset, perisistent, no aggravating or improving factors, no associated symptoms.  Lack of appetite.  SOB: mild SOB at rest. Worsened with exertion.   Review of Systems  Constitutional: Positive for malaise/fatigue. Negative for chills and fever.  HENT: Negative for congestion, ear discharge, ear pain, nosebleeds, sinus pain and sore throat.   Eyes: Negative for double vision, photophobia, pain, discharge and redness.  Respiratory: Positive for shortness of breath. Negative for cough, hemoptysis, sputum production and wheezing.   Cardiovascular: Negative for chest pain, palpitations, orthopnea, claudication and leg swelling.  Gastrointestinal: Positive for abdominal pain. Negative for blood in stool, constipation, diarrhea, heartburn, melena, nausea and vomiting.  Genitourinary: Negative for dysuria, flank pain, frequency and hematuria.  Musculoskeletal: Negative for back pain, myalgias and neck pain.  Skin: Negative for itching and rash.  Neurological: Positive for  weakness. Negative for dizziness, tingling, tremors, focal weakness and headaches.  Endo/Heme/Allergies: Negative for environmental allergies. Does not bruise/bleed easily.  Psychiatric/Behavioral: Positive for depression. Negative for hallucinations. The patient is nervous/anxious.       Allergies  Allergen Reactions  . Sulfa Antibiotics   . Xanax [Alprazolam]     Pt experienced itching all over body     Past Medical History:  Diagnosis Date  . Depression   . Diabetes mellitus without complication ([place])   . Diverticulitis   . DVT (deep vein thrombosis) in pregnancy ([place]) 08/29/2017   Right popliteal and femoral  . GERD (gastroesophageal reflux disease)   . Hypercalcemia 10/22/2017  . Hyperlipidemia   . Hypertension   . Hypothyroidism   . Obesity   . Pancreatic cancer ([place]) 10/22/2017     Past Surgical History:  Procedure Laterality Date  . ABDOMINAL HYSTERECTOMY  2005  . CHOLECYSTECTOMY  2015  . COLONOSCOPY WITH PROPOFOL N/A 07/01/2015   Procedure: COLONOSCOPY WITH PROPOFOL;  Surgeon: Josefine Class, MD;  Location: [facility] ENDOSCOPY;  Service: Endoscopy;  Laterality: N/A;  . ESOPHAGOGASTRODUODENOSCOPY (EGD) WITH PROPOFOL N/A 07/01/2015   Procedure: ESOPHAGOGASTRODUODENOSCOPY (EGD) WITH PROPOFOL;  Surgeon: Josefine Class, MD;  Location: [facility] ENDOSCOPY;  Service: Endoscopy;  Laterality: N/A;  . ESOPHAGOGASTRODUODENOSCOPY (EGD) WITH PROPOFOL N/A 09/08/2017   Procedure: ESOPHAGOGASTRODUODENOSCOPY (EGD) WITH PROPOFOL;  Surgeon: Lucilla Lame, MD;  Location: [facility] ENDOSCOPY;  Service: Endoscopy;  Laterality: N/A;  . ESOPHAGOGASTRODUODENOSCOPY (EGD) WITH PROPOFOL N/A 09/21/2017   Procedure: ESOPHAGOGASTRODUODENOSCOPY (EGD) WITH PROPOFOL;  Surgeon: Lucilla Lame, MD;  Location: [facility] ENDOSCOPY;  Service: Endoscopy;  Laterality: N/A;  . ESOPHAGOGASTRODUODENOSCOPY (EGD) WITH PROPOFOL N/A 10/18/2017   Procedure: ESOPHAGOGASTRODUODENOSCOPY (EGD) WITH PROPOFOL;  Surgeon: [name], MD;   Location: [facility] ENDOSCOPY;  Service: Gastroenterology;  Laterality: N/A;  .  IVC FILTER INSERTION N/A 09/08/2017   Procedure: IVC FILTER INSERTION;  Surgeon: Algernon Huxley, MD;  Location: [facility];  Service: Cardiovascular;  Laterality: N/A;    Social History   Socioeconomic History  . Marital status: Married    Spouse name: Not on file  . Number of children: Not on file  . Years of education: Not on file  . Highest education level: Not on file  Occupational History  . Not on file  Social Needs  . Financial resource strain: Not on file  . Food insecurity:    Worry: Not on file    Inability: Not on file  . Transportation needs:    Medical: Not on file    Non-medical: Not on file  Tobacco Use  . Smoking status: Never Smoker  . Smokeless tobacco: Never Used  Substance and Sexual Activity  . Alcohol use: No  . Drug use: No  . Sexual activity: Not on file  Lifestyle  . Physical activity:    Days per week: Not on file    Minutes per session: Not on file  . Stress: Not on file  Relationships  . Social connections:    Talks on phone: Not on file    Gets together: Not on file    Attends religious service: Not on file    Active member of club or organization: Not on file    Attends meetings of clubs or organizations: Not on file    Relationship status: Not on file  . Intimate partner violence:    Fear of current or ex partner: Not on file    Emotionally abused: Not on file    Physically abused: Not on file    Forced sexual activity: Not on file  Other Topics Concern  . Not on file  Social History Narrative  . Not on file    Family History  Problem Relation Age of Onset  . Deep vein thrombosis Neg Hx      Current Outpatient Medications:  .  dronabinol (MARINOL) 5 MG capsule, Take 1 capsule (5 mg total) by mouth 2 (two) times daily before a meal., Disp: 30 capsule, Rfl: 0 .  fentaNYL (DURAGESIC - DOSED MCG/HR) 50 MCG/HR, Place 1 patch (50 mcg total) onto the  skin every 3 (three) days., Disp: 5 patch, Rfl: 0 .  HYDROcodone-acetaminophen (HYCET) 7.5-325 mg/15 ml solution, Take 15 mLs by mouth every 3 (three) hours as needed for moderate pain or severe pain., Disp: 200 mL, Rfl: 0 .  levothyroxine (SYNTHROID, LEVOTHROID) 125 MCG tablet, Take 125 mcg by mouth daily before breakfast., Disp: , Rfl:  .  LORazepam (ATIVAN) 0.5 MG tablet, Take 1 tablet (0.5 mg total) by mouth every 8 (eight) hours., Disp: 30 tablet, Rfl: 0 .  metoprolol succinate (TOPROL-XL) 100 MG 24 hr tablet, Take 50 mg by mouth daily. Take with or immediately following a meal. , Disp: , Rfl:  .  omeprazole (PRILOSEC) 40 MG capsule, Take 1 capsule (40 mg total) by mouth 2 (two) times daily before a meal., Disp: 60 capsule, Rfl: 0 .  protein supplement shake (PREMIER PROTEIN) LIQD, Take 325 mLs (11 oz total) by mouth daily., Disp: 30 Can, Rfl: 0 .  sucralfate (CARAFATE) 1 g tablet, Take 1 tablet (1 g total) by mouth 4 (four) times daily., Disp: 120 tablet, Rfl: 5 .  venlafaxine XR (EFFEXOR-XR) 75 MG 24 hr capsule, Take 75 mg by mouth daily with breakfast., Disp: , Rfl:  .  zolpidem (AMBIEN) 5 MG tablet, Take 1 tablet (5 mg total) by mouth at bedtime as needed for sleep. Not to use together with Ativan., Disp: 30 tablet, Rfl: 0 .  gabapentin (NEURONTIN) 250 MG/5ML solution, Take 4 mLs (200 mg total) by mouth every 12 (twelve) hours. (Patient not taking: Reported on 02/22/2018), Disp: 300 mL, Rfl: 0 .  lidocaine (XYLOCAINE) 2 % solution, Use as directed 5 mLs in the mouth or throat 3 (three) times daily as needed for mouth pain. (Patient not taking: Reported on 02/22/2018), Disp: 100 mL, Rfl: 0 .  nystatin (MYCOSTATIN) 100000 UNIT/ML suspension, Take 5 mLs (500,000 Units total) by mouth 4 (four) times daily. (Patient not taking: Reported on 11/21/2017), Disp: 60 mL, Rfl: 0 .  ondansetron (ZOFRAN) 8 MG tablet, Take 1 tablet (8 mg total) by mouth 2 (two) times daily as needed (Nausea or vomiting). (Patient  not taking: Reported on 02/22/2018), Disp: 30 tablet, Rfl: 1 .  sennosides (SENOKOT) 8.8 MG/5ML syrup, Take 15 mLs by mouth 2 (two) times daily. (Patient not taking: Reported on 11/07/2017), Disp: 240 mL, Rfl: 0 No current facility-administered medications for this visit.   Facility-Administered Medications Ordered in Other Visits:  .  0.9 %  sodium chloride infusion, , Intravenous, Continuous, Burns, Wandra Feinstein, NP, Stopped at 11/14/17 1420  Physical exam: ECOG 3 Vitals:   02/22/18 1315 02/22/18 1325  BP:  138/83  Pulse:  71  Resp: 16   Temp:  (!) 96.5 F (35.8 C)  TempSrc:  Tympanic  Weight: 174 lb 6.4 oz (79.1 kg)   Height: 5\' 10"  (1.778 m)    Physical Exam  Constitutional: She is oriented to person, place, and time. She appears well-developed. No distress.  HENT:  Head: Normocephalic and atraumatic.  Eyes: Pupils are equal, round, and reactive to light. EOM are normal.  Neck: Neck supple.  Cardiovascular: Normal rate.  No murmur heard. Pulmonary/Chest: Effort normal. No respiratory distress.  Decreased breath sounds B/L  Abdominal: Soft. She exhibits distension.  Musculoskeletal: Normal range of motion.  Neurological: She is alert and oriented to person, place, and time.  Skin: Skin is warm and dry.  Psychiatric: She has a normal mood and affect.    CMP Latest Ref Rng & Units 11/28/2017  Glucose 65 - 99 mg/dL 154(H)  BUN 6 - 20 mg/dL <5(L)  Creatinine 0.44 - 1.00 mg/dL 0.72  Sodium 135 - 145 mmol/L 136  Potassium 3.5 - 5.1 mmol/L 3.3(L)  Chloride 101 - 111 mmol/L 101  CO2 22 - 32 mmol/L 25  Calcium 8.9 - 10.3 mg/dL 9.2  Total Protein 6.5 - 8.1 g/dL 7.1  Total Bilirubin 0.3 - 1.2 mg/dL 0.9  Alkaline Phos 38 - 126 U/L 81  AST 15 - 41 U/L 27  ALT 14 - 54 U/L 20   CBC Latest Ref Rng & Units 11/28/2017  WBC 3.6 - 11.0 K/uL 6.6  Hemoglobin 12.0 - 16.0 g/dL 8.2(L)  Hematocrit 35.0 - 47.0 % 24.1(L)  Platelets 150 - 440 K/uL 322    Dg Chest 2 View  Result Date:  02/22/2018 CLINICAL DATA:  Chronic progressive shortness of breath. EXAM: CHEST - 2 VIEW COMPARISON:  Chest x-rays dated 10/29/2017 and 09/07/2017 FINDINGS: PICC tip is in the superior vena cava just below the level of the carina. The heart size and pulmonary vascularity are normal. There are several tiny areas of increased density in the lungs bilaterally without areas of discrete consolidation. No effusions. No bone abnormality. IMPRESSION:  Several small areas of faint density in both lungs which may represent tiny areas of infiltrate. In this patient with known malignancy, the possibility of small metastases should be considered. Electronically Signed   By: Lorriane Shire M.D.   On: 02/22/2018 16:02     Assessment and plan  1. Malignant neoplasm of pancreas, unspecified location of malignancy ([place])   2. SOB (shortness of breath)   3. Goals of care, counseling/discussion   4. Abdominal distension    Discussed with patient and her family members that her increased abdominal pain, and distention most likely is due to cancer progression and clinically consistent with ascites.  Recommend palliative US guided paracentesis.   CXR was independently reviewed and there is no pleural effusion. Small areas of density likely lung metastatic disease.   I had a lengthy discussion with patient and her family members. Patient asked questions about oral chemotherapy but eventually want to continue her decision about hospice care and focal on symptoms relief. Explained to her that lab work and image study will not be done unless deemed necessary to help relieve symptoms. We checked CXR today for evaluate pleural effusion. Also will obtain US paracentesis. Further image scans and lab work do not benefit her at this point.  Patient and family voice understanding.  Orders Placed This Encounter  Procedures  . US Paracentesis    Standing Status:   Future    Standing Expiration Date:   02/22/2019    Order Specific  Question:   If therapeutic, is there a maximum amount of fluid to be removed?    Answer:   Yes    Order Specific Question:   What is the maximum amount of fluide to be removed?    Answer:   4    Order Specific Question:   Are labs required for specimen collection?    Answer:   No    Order Specific Question:   Is Albumin medication needed?    Answer:   No    Order Specific Question:   Reason for Exam (SYMPTOM  OR DIAGNOSIS REQUIRED)    Answer:   ascites. abd distention    Order Specific Question:   Preferred imaging location?    Answer:   [facility]  . DG Chest 2 View    Standing Status:   Future    Number of Occurrences:   1    Standing Expiration Date:   02/22/2019    Order Specific Question:   Reason for Exam (SYMPTOM  OR DIAGNOSIS REQUIRED)    Answer:   SOB    Order Specific Question:   Preferred imaging location?    Answer:   [facility]    Order Specific Question:   Radiology Contrast Protocol - do NOT remove file path    Answer:   [email or web address]  . Protime-INR    Standing Status:   Future    Number of Occurrences:   1    Standing Expiration Date:   02/23/2019  . APTT    Standing Status:   Future    Number of Occurrences:   1    Standing Expiration Date:   02/23/2019     Total face to face encounter time for this patient visit was 40 min. >50% of the time was  spent in counseling and coordination of care.    Earlie Server, MD, PhD Hematology Oncology [facility] Pager- [phone] 02/22/2018

## 2018-02-22 NOTE — Progress Notes (Signed)
Patient here for follow up. She reports increasing abdominal pain. Appetite  fair.

## 2018-02-24 ENCOUNTER — Other Ambulatory Visit: Payer: Self-pay | Admitting: Oncology

## 2018-02-25 ENCOUNTER — Telehealth: Payer: Self-pay | Admitting: Oncology

## 2018-02-25 NOTE — Discharge Instructions (Signed)
Paracentesis, Care After °Refer to this sheet in the next few weeks. These instructions provide you with information about caring for yourself after your procedure. Your health care provider may also give you more specific instructions. Your treatment has been planned according to current medical practices, but problems sometimes occur. Call your health care provider if you have any problems or questions after your procedure. °What can I expect after the procedure? °After your procedure, it is common to have a small amount of clear fluid coming from the puncture site. °Follow these instructions at home: °· Return to your normal activities as told by your health care provider. Ask your health care provider what activities are safe for you. °· Take over-the-counter and prescription medicines only as told by your health care provider. °· Do not take baths, swim, or use a hot tub until your health care provider approves. °· Follow instructions from your health care provider about: °? How to take care of your puncture site. °? When and how you should change your bandage (dressing). °? When you should remove your dressing. °· Check your puncture area every day signs of infection. Watch for: °? Redness, swelling, or pain. °? Fluid, blood, or pus. °· Keep all follow-up visits as told by your health care provider. This is important. °Contact a health care provider if: °· You have redness, swelling, or pain at your puncture site. °· You start to have more clear fluid coming from your puncture site. °· You have blood or pus coming from your puncture site. °· You have chills. °· You have a fever. °Get help right away if: °· You develop chest pain or shortness of breath. °· You develop increasing pain, discomfort, or swelling in your abdomen. °· You feel dizzy or light-headed or you pass out. °This information is not intended to replace advice given to you by your health care provider. Make sure you discuss any questions you  have with your health care provider. °Document Released: 12/22/2014 Document Revised: 01/13/2016 Document Reviewed: 10/20/2014 °Elsevier Interactive Patient Education © 2018 [employer] ° °

## 2018-02-25 NOTE — Telephone Encounter (Signed)
Received call from Nancee Liter advising me to call patient to confirm Procedure date and time for 02/27/18 w [facility] Appointment conf w patient.

## 2018-02-27 ENCOUNTER — Ambulatory Visit: Payer: BLUE CROSS/BLUE SHIELD | Admitting: Oncology

## 2018-02-27 ENCOUNTER — Ambulatory Visit
Admission: RE | Admit: 2018-02-27 | Discharge: 2018-02-27 | Disposition: A | Discharge status: Home / Self Care | Payer: BLUE CROSS/BLUE SHIELD | Source: Ambulatory Visit | Attending: Oncology | Admitting: Oncology

## 2018-02-27 DIAGNOSIS — C259 Malignant neoplasm of pancreas, unspecified: Secondary | ICD-10-CM | POA: Insufficient documentation

## 2018-02-27 DIAGNOSIS — R180 Malignant ascites: Secondary | ICD-10-CM | POA: Diagnosis not present

## 2018-02-27 DIAGNOSIS — R0602 Shortness of breath: Secondary | ICD-10-CM | POA: Diagnosis not present

## 2018-02-27 NOTE — Procedures (Signed)
Pre Procedural Dx: Symptomatic Ascites Post Procedural Dx: Same  Successful US guided paracentesis yielding 3.8 L of serous ascitic fluid.  EBL: None  Complications: None immediate  Jay Maryssa Giampietro, MD Pager #: [phone]   

## 2018-02-28 ENCOUNTER — Other Ambulatory Visit: Payer: Self-pay | Admitting: *Deleted

## 2018-02-28 MED ORDER — FENTANYL 25 MCG/HR TD PT72: 25.0000 ug | MEDICATED_PATCH | TRANSDERMAL | 0 refills | Status: DC

## 2018-02-28 NOTE — Telephone Encounter (Signed)
Patient having increased pain and Fentanyl 50 mcg is not controlling pain. Requesting increase in fentanyl or something else for pain. Please advise

## 2018-03-01 ENCOUNTER — Other Ambulatory Visit: Payer: Self-pay | Admitting: Oncology

## 2018-03-01 MED ORDER — FENTANYL 25 MCG/HR TD PT72: 25.0000 ug | MEDICATED_PATCH | TRANSDERMAL | 0 refills | Status: DC

## 2018-03-04 ENCOUNTER — Telehealth: Payer: Self-pay | Admitting: *Deleted

## 2018-03-04 NOTE — Telephone Encounter (Signed)
Hospice called to report that the patients current pain medication is not effective. They reccommended stopping Hydro-codone and starting Morphine 15 mg every two hours. Keep fetanyl the same.

## 2018-03-11 ENCOUNTER — Other Ambulatory Visit: Payer: Self-pay | Admitting: Oncology

## 2018-03-11 ENCOUNTER — Other Ambulatory Visit: Payer: Self-pay | Admitting: *Deleted

## 2018-03-11 ENCOUNTER — Telehealth: Payer: Self-pay | Admitting: Oncology

## 2018-03-11 DIAGNOSIS — C259 Malignant neoplasm of pancreas, unspecified: Secondary | ICD-10-CM

## 2018-03-11 DIAGNOSIS — R180 Malignant ascites: Secondary | ICD-10-CM

## 2018-03-11 MED ORDER — MORPHINE SULFATE (CONCENTRATE) 20 MG/ML PO SOLN: 5.0000 mg | ORAL | 0 refills | Status: AC | PRN

## 2018-03-11 MED ORDER — FENTANYL 75 MCG/HR TD PT72: 75.0000 ug | MEDICATED_PATCH | TRANSDERMAL | 0 refills | Status: DC

## 2018-03-11 NOTE — Telephone Encounter (Signed)
Called patient to inform her about Paracentesis appointment on Friday 7/26 @ 0830.

## 2018-03-11 NOTE — Telephone Encounter (Signed)
Patient asking when she can have another paracentesis as she is feeling tight in her abdomen and painful. Please advise/ arrange

## 2018-03-13 ENCOUNTER — Telehealth: Payer: Self-pay | Admitting: *Deleted

## 2018-03-13 NOTE — Telephone Encounter (Signed)
Ann Larson informed and will discuss with patient and get back to me

## 2018-03-13 NOTE — Telephone Encounter (Signed)
Patient is requesting a pleur ex cath be inserted to do home drainage of ascites. She is upset that she is not having one inserted with drainage this Friday. I explained to hospice nurse that we do not generally have pleurex put in because someone has been tapped once and getting a second. She voiced understanding and states this is what she had told the patient and the patient response was that she did not feel this is good comfort care to have to wait to be scheduled for tap and that it is a painful procedure. Please advise

## 2018-03-13 NOTE — Telephone Encounter (Signed)
I recommend standing US paracentesis weekly.  Please let patient know that peritoneal port or indwelling tunnel catheter can be placed to faciliate repeated paracentesis.  However this is associated with complications including infection, catheter blockage,leakage around insertion sites, etc. If she does not want repeated US guided paracentesis, we can arrange IR to place indwelling tunnel catheter. Thanks.

## 2018-03-15 ENCOUNTER — Ambulatory Visit
Admission: RE | Admit: 2018-03-15 | Discharge: 2018-03-15 | Disposition: A | Discharge status: Home / Self Care | Payer: BLUE CROSS/BLUE SHIELD | Source: Ambulatory Visit | Attending: Oncology | Admitting: Oncology

## 2018-03-15 DIAGNOSIS — R180 Malignant ascites: Secondary | ICD-10-CM

## 2018-03-15 DIAGNOSIS — C259 Malignant neoplasm of pancreas, unspecified: Secondary | ICD-10-CM | POA: Diagnosis present

## 2018-03-15 DIAGNOSIS — R188 Other ascites: Secondary | ICD-10-CM | POA: Insufficient documentation

## 2018-03-15 MED ORDER — ALBUMIN HUMAN 25 % IV SOLN
12.5000 g | Freq: Once | INTRAVENOUS | Status: AC
  Administered 2018-03-15: 12.5 g via INTRAVENOUS
  Filled 2018-03-15 (×2): qty 50

## 2018-03-15 MED ORDER — ALBUMIN HUMAN 25 % IV SOLN
INTRAVENOUS | Status: AC
  Administered 2018-03-15: 12.5 g via INTRAVENOUS
  Filled 2018-03-15: qty 100

## 2018-03-15 MED ORDER — ALBUMIN HUMAN 25 % IV SOLN
25.0000 g | Freq: Once | INTRAVENOUS | Status: AC
  Administered 2018-03-15: 25 g via INTRAVENOUS

## 2018-03-15 NOTE — Procedures (Signed)
US guided paracentesis.  Removed 4 liters.  Minimal blood loss and no immediate complication.

## 2018-03-18 ENCOUNTER — Encounter: Payer: Self-pay | Admitting: Emergency Medicine

## 2018-03-18 ENCOUNTER — Emergency Department: Payer: BLUE CROSS/BLUE SHIELD

## 2018-03-18 ENCOUNTER — Telehealth: Payer: Self-pay | Admitting: *Deleted

## 2018-03-18 ENCOUNTER — Other Ambulatory Visit: Payer: Self-pay | Admitting: Oncology

## 2018-03-18 ENCOUNTER — Other Ambulatory Visit: Payer: Self-pay

## 2018-03-18 ENCOUNTER — Emergency Department
Admission: EM | Admit: 2018-03-18 | Discharge: 2018-03-18 | Disposition: A | Discharge status: Home / Self Care | Payer: BLUE CROSS/BLUE SHIELD | Attending: Emergency Medicine | Admitting: Emergency Medicine

## 2018-03-18 DIAGNOSIS — E039 Hypothyroidism, unspecified: Secondary | ICD-10-CM | POA: Diagnosis not present

## 2018-03-18 DIAGNOSIS — R180 Malignant ascites: Secondary | ICD-10-CM

## 2018-03-18 DIAGNOSIS — C259 Malignant neoplasm of pancreas, unspecified: Secondary | ICD-10-CM

## 2018-03-18 DIAGNOSIS — Z452 Encounter for adjustment and management of vascular access device: Secondary | ICD-10-CM | POA: Insufficient documentation

## 2018-03-18 DIAGNOSIS — I10 Essential (primary) hypertension: Secondary | ICD-10-CM | POA: Diagnosis not present

## 2018-03-18 DIAGNOSIS — E119 Type 2 diabetes mellitus without complications: Secondary | ICD-10-CM | POA: Insufficient documentation

## 2018-03-18 DIAGNOSIS — Z139 Encounter for screening, unspecified: Secondary | ICD-10-CM

## 2018-03-18 NOTE — Telephone Encounter (Signed)
Standing weekly US paracentesis ordered.

## 2018-03-18 NOTE — Telephone Encounter (Signed)
Patient in agreement with weekly paracentesis being done Please arrange this and let Magda Paganini know it is done [phone]. Her last one was Friday

## 2018-03-18 NOTE — ED Provider Notes (Signed)
[facility] Emergency Department Provider Note       Time seen: ----------------------------------------- 8:11 PM on 03/18/2018 -----------------------------------------   I have reviewed the triage vital signs and the nursing notes.  HISTORY   Chief Complaint Foreign Body    HPI Ann Larson is a 62 y.o. female with a history of depression, diabetes, diverticulitis, DVT, hyperlipidemia and hypertension who presents to the ED for possible PICC line malfunction.  Patient presents with PICC line that came on Biaxin today.  The tip was thought to be missing.  She is under the care of hospice and was told she no longer needs the PICC line but she needs to have the tip removed.  She denies fevers, chills, arm pain, shortness of breath or chest pain  Past Medical History:  Diagnosis Date  . Depression   . Diabetes mellitus without complication ([place])   . Diverticulitis   . DVT (deep vein thrombosis) in pregnancy ([place]) 08/29/2017   Right popliteal and femoral  . GERD (gastroesophageal reflux disease)   . Hypercalcemia 10/22/2017  . Hyperlipidemia   . Hypertension   . Hypothyroidism   . Obesity   . Pancreatic cancer ([place]) 10/22/2017    Patient Active Problem List   Diagnosis Date Noted  . Dehydration   . Dizziness   . Goals of care, counseling/discussion 10/23/2017  . Malignant neoplasm of pancreas ([place]) 10/22/2017  . Hypercalcemia 10/22/2017  . Multiple lesions of metastatic malignancy ([place]) 10/16/2017  . Generalized abdominal pain   . Omental mass   . Pancreatic mass   . Hypothyroidism 10/15/2017  . Diabetes ([place]) 10/15/2017  . Hypertension 10/05/2017  . Deep vein thrombosis (DVT) of right lower extremity ([place]) 09/26/2017  . Personal history of peptic ulcer disease   . Gastritis with hemorrhage   . Symptomatic anemia   . Heme + stool   . Acute GI bleeding   . GI bleed 09/07/2017    Past Surgical History:  Procedure Laterality Date  .  ABDOMINAL HYSTERECTOMY  2005  . CHOLECYSTECTOMY  2015  . COLONOSCOPY WITH PROPOFOL N/A 07/01/2015   Procedure: COLONOSCOPY WITH PROPOFOL;  Surgeon: Josefine Class, MD;  Location: [facility] ENDOSCOPY;  Service: Endoscopy;  Laterality: N/A;  . ESOPHAGOGASTRODUODENOSCOPY (EGD) WITH PROPOFOL N/A 07/01/2015   Procedure: ESOPHAGOGASTRODUODENOSCOPY (EGD) WITH PROPOFOL;  Surgeon: Josefine Class, MD;  Location: [facility] ENDOSCOPY;  Service: Endoscopy;  Laterality: N/A;  . ESOPHAGOGASTRODUODENOSCOPY (EGD) WITH PROPOFOL N/A 09/08/2017   Procedure: ESOPHAGOGASTRODUODENOSCOPY (EGD) WITH PROPOFOL;  Surgeon: Lucilla Lame, MD;  Location: [facility] ENDOSCOPY;  Service: Endoscopy;  Laterality: N/A;  . ESOPHAGOGASTRODUODENOSCOPY (EGD) WITH PROPOFOL N/A 09/21/2017   Procedure: ESOPHAGOGASTRODUODENOSCOPY (EGD) WITH PROPOFOL;  Surgeon: Lucilla Lame, MD;  Location: [facility] ENDOSCOPY;  Service: Endoscopy;  Laterality: N/A;  . ESOPHAGOGASTRODUODENOSCOPY (EGD) WITH PROPOFOL N/A 10/18/2017   Procedure: ESOPHAGOGASTRODUODENOSCOPY (EGD) WITH PROPOFOL;  Surgeon: [name], MD;  Location: [facility] ENDOSCOPY;  Service: Gastroenterology;  Laterality: N/A;  . IVC FILTER INSERTION N/A 09/08/2017   Procedure: IVC FILTER INSERTION;  Surgeon: Algernon Huxley, MD;  Location: [facility];  Service: Cardiovascular;  Laterality: N/A;    Allergies Sulfa antibiotics and Xanax [alprazolam]  Social History Social History   Tobacco Use  . Smoking status: Never Smoker  . Smokeless tobacco: Never Used  Substance Use Topics  . Alcohol use: No  . Drug use: No   Review of Systems Constitutional: Negative for fever. Cardiovascular: Negative for chest pain. Respiratory: Negative for shortness of breath. Skin:  Negative for rash. Neurological: Negative for headaches, focal weakness or numbness.  All systems negative/normal/unremarkable except as stated in the HPI  ____________________________________________   PHYSICAL EXAM:  VITAL  SIGNS: ED Triage Vitals [03/18/18 1835]  Enc Vitals Group     BP 123/85     Pulse Rate (!) 111     Resp 18     Temp 97.6 F (36.4 C)     Temp Source Oral     SpO2 95 %     Weight 174 lb (78.9 kg)     Height 5\' 9"  (1.753 m)     Head Circumference      Peak Flow      Pain Score 0     Pain Loc      Pain Edu?      Excl. in [place]?    Constitutional: Alert and oriented.  Chronically ill-appearing, no distress Eyes: Conjunctivae are normal. Normal extraocular movements. Cardiovascular: Normal rate, regular rhythm. No murmurs, rubs, or gallops. Respiratory: Normal respiratory effort without tachypnea nor retractions. Breath sounds are clear and equal bilaterally. No wheezes/rales/rhonchi. Gastrointestinal: Soft and nontender. Normal bowel sounds Musculoskeletal: Nontender with normal range of motion in extremities.  Recent right upper extremity PICC line site is clean dry and intact Neurologic:  Normal speech and language. No gross focal neurologic deficits are appreciated.  Skin:  Skin is warm, dry and intact. No rash noted. Psychiatric: Mood and affect are normal. Speech and behavior are normal.  ____________________________________________  ED COURSE:  As part of my medical decision making, I reviewed the following data within the [place] History obtained from family if available, nursing notes, old chart and ekg, as well as notes from prior ED visits. Patient presented for possible PICC line tip removal, we will assess with imaging as indicated   Procedures ____________________________________________   RADIOLOGY  Chest x-ray is normal, right humerus x-ray is normal  ____________________________________________  DIFFERENTIAL DIAGNOSIS   PICC line dysfunction, previous PICC line length adjustment  FINAL ASSESSMENT AND PLAN  Medical screening exam   Plan: The patient had presented for possible PICC line dysfunction or accidentally losing the PICC line  tip.  Patient's imaging did not reveal any remnant of the PICC line.  Looking at the PICC line that the family brought with her this appears to have been surgically altered previously.  I am doubtful for any PICC line remnant.  She is cleared for outpatient follow-up.   Laurence Aly, MD   Note: This note was generated in part or whole with voice recognition software. Voice recognition is usually quite accurate but there are transcription errors that can and very often do occur. I apologize for any typographical errors that were not detected and corrected.     Earleen Newport, MD 03/18/18 2016

## 2018-03-18 NOTE — ED Notes (Addendum)
Upon assessment pt alert & oriented x 4. Denies any pain. States the tip of her picc line is still inside her vein

## 2018-03-18 NOTE — Progress Notes (Signed)
US

## 2018-03-18 NOTE — ED Triage Notes (Signed)
Pt presents with picc line that came out by accident today; tip missing. Pt is under care of hospice and they told her she no longer needs the picc but she needs to have the tip removed. Pt alert & oriented, denies pain.

## 2018-03-18 NOTE — Telephone Encounter (Signed)
Order faxed to centralized scheduling... Will notify Ann Larson once we have an appt scheduled.

## 2018-03-22 ENCOUNTER — Other Ambulatory Visit: Payer: Self-pay | Admitting: Oncology

## 2018-03-22 ENCOUNTER — Ambulatory Visit
Admission: RE | Admit: 2018-03-22 | Discharge: 2018-03-22 | Disposition: A | Discharge status: Home / Self Care | Payer: BLUE CROSS/BLUE SHIELD | Source: Ambulatory Visit | Attending: Oncology | Admitting: Oncology

## 2018-03-22 DIAGNOSIS — C259 Malignant neoplasm of pancreas, unspecified: Secondary | ICD-10-CM

## 2018-03-22 DIAGNOSIS — R180 Malignant ascites: Secondary | ICD-10-CM

## 2018-03-22 NOTE — Procedures (Signed)
Pre Procedural Dx: Symptomatic Ascites Post Procedural Dx: Same  Successful US guided paracentesis yielding 3.9 L of serous ascitic fluid.  EBL: None  Complications: None immediate  Jay Dewanna Hurston, MD Pager #: [phone]   

## 2018-03-27 ENCOUNTER — Other Ambulatory Visit: Payer: Self-pay | Admitting: *Deleted

## 2018-03-27 MED ORDER — FENTANYL 75 MCG/HR TD PT72: 75.0000 ug | MEDICATED_PATCH | TRANSDERMAL | 0 refills | Status: AC

## 2018-03-29 ENCOUNTER — Ambulatory Visit
Admission: RE | Admit: 2018-03-29 | Discharge: 2018-03-29 | Disposition: A | Discharge status: Home / Self Care | Payer: BLUE CROSS/BLUE SHIELD | Source: Ambulatory Visit | Attending: Oncology | Admitting: Oncology

## 2018-03-29 DIAGNOSIS — R180 Malignant ascites: Secondary | ICD-10-CM | POA: Diagnosis present

## 2018-03-29 DIAGNOSIS — C259 Malignant neoplasm of pancreas, unspecified: Secondary | ICD-10-CM | POA: Insufficient documentation

## 2018-03-29 NOTE — Procedures (Signed)
US paracentesis.  Removed 3.5 liters of amber fluid.  Minimal blood loss and no immediate complication.

## 2018-04-03 ENCOUNTER — Other Ambulatory Visit: Payer: Self-pay | Admitting: Oncology

## 2018-04-03 ENCOUNTER — Telehealth: Payer: Self-pay | Admitting: *Deleted

## 2018-04-03 DIAGNOSIS — R180 Malignant ascites: Secondary | ICD-10-CM

## 2018-04-03 DIAGNOSIS — C259 Malignant neoplasm of pancreas, unspecified: Secondary | ICD-10-CM

## 2018-04-03 NOTE — Telephone Encounter (Signed)
I spoke with daughter and she will take her to PCP or Urgent care

## 2018-04-03 NOTE — Telephone Encounter (Signed)
Call from family that patient has had a clogged ear for 2 days and is having pain in it. Hospice nurse told her she cannot tell if she has an ear infection or not. She is asking if the patient should be brought in to be seen. Please advise

## 2018-04-03 NOTE — Telephone Encounter (Signed)
Can she see primary? Thanks.

## 2018-04-05 ENCOUNTER — Ambulatory Visit: Admission: RE | Admit: 2018-04-05 | Payer: BLUE CROSS/BLUE SHIELD | Source: Ambulatory Visit

## 2018-04-12 ENCOUNTER — Ambulatory Visit: Admission: RE | Admit: 2018-04-12 | Payer: BLUE CROSS/BLUE SHIELD | Source: Ambulatory Visit

## 2018-04-19 ENCOUNTER — Ambulatory Visit: Admission: RE | Admit: 2018-04-19 | Payer: BLUE CROSS/BLUE SHIELD | Source: Ambulatory Visit

## 2018-04-21 DEATH — deceased

## 2018-06-05 IMAGING — US US EXTREM LOW VENOUS BILAT
1 series · 13 of 24 positions shown · non-contrast
Comparison: Right lower extremity venous Doppler - 09/23/2017;
09/17/2017; 08/29/2017; CT scan abdomen pelvis-10/16/2017

CLINICAL DATA: Bilateral lower extremity pain and edema, right
greater than left. History of prior DVT and IVC filter placement.



[Series 1: us extrem low venous bilat · 0.08mm/px · 13 of 60 slices shown]
[im 1/60]
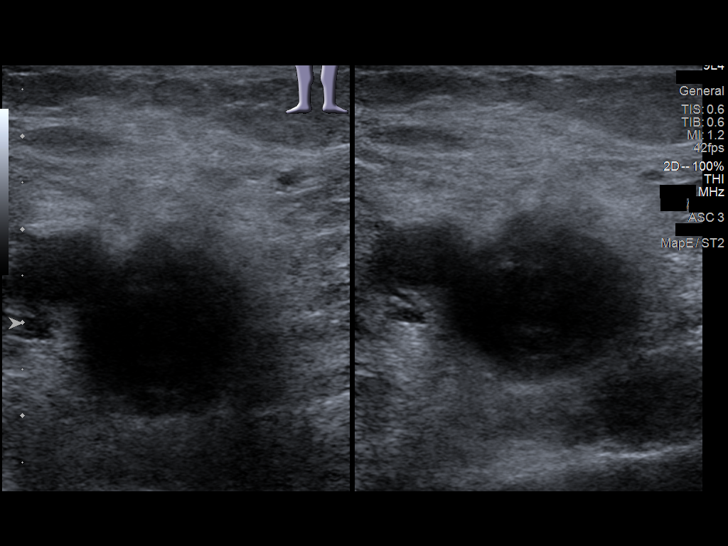
[im 6/60]
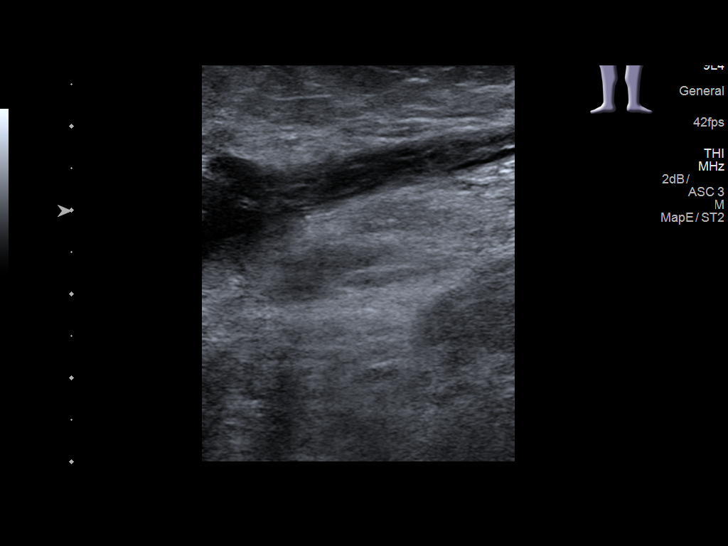
[im 11/60]
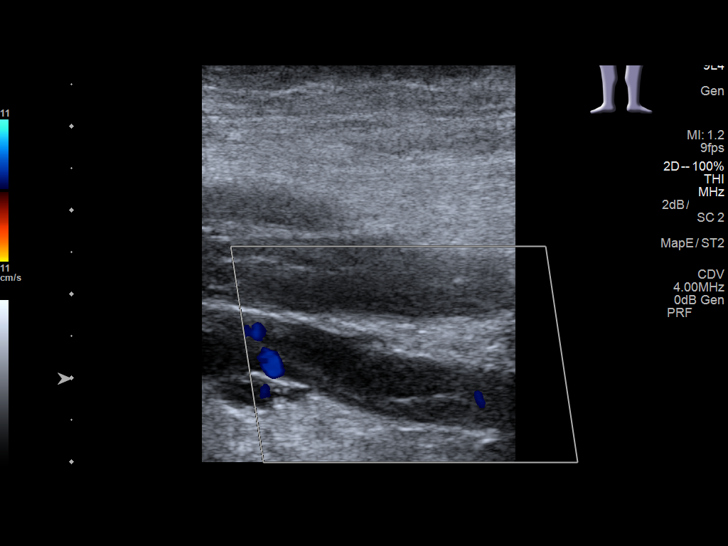
[im 16/60]
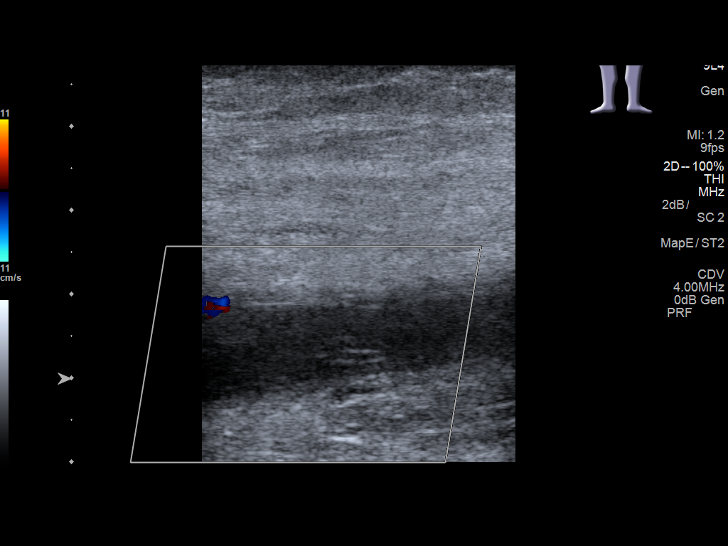
[im 21/60]
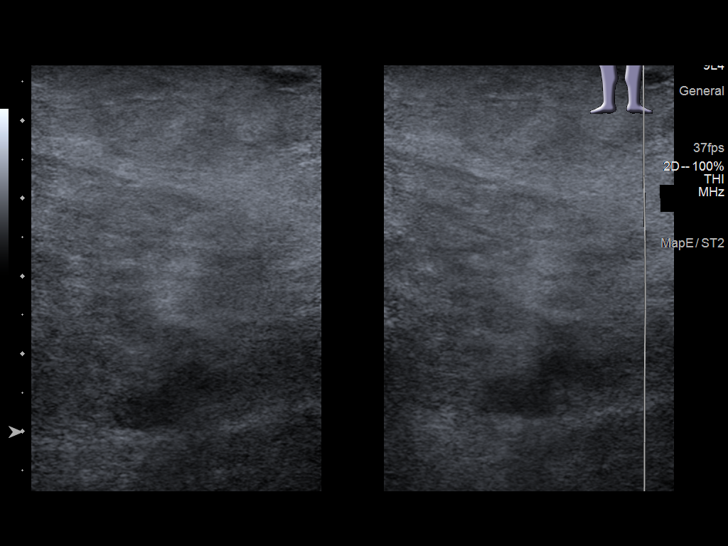
[im 26/60]
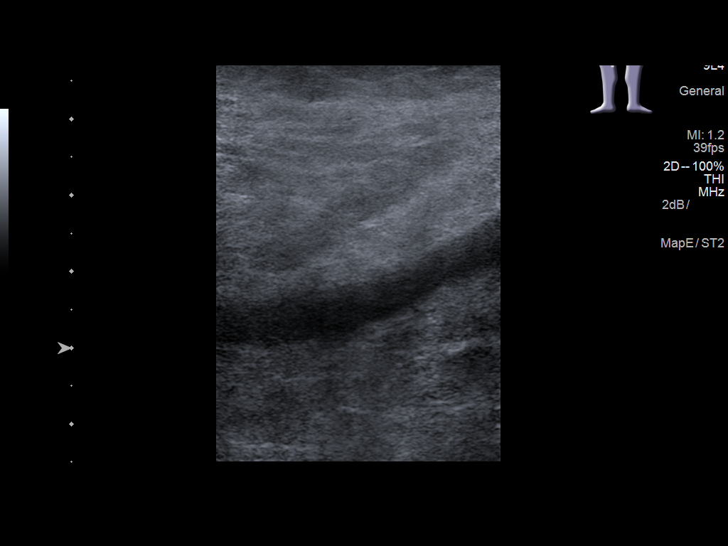
[im 31/60]
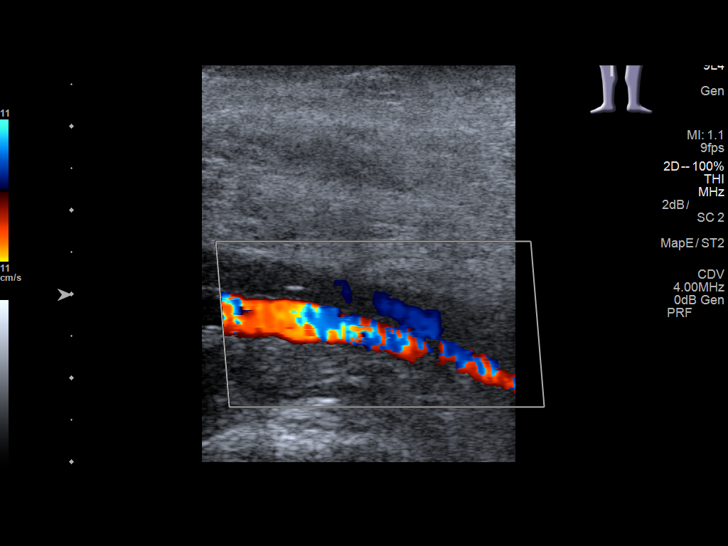
[im 34/60]
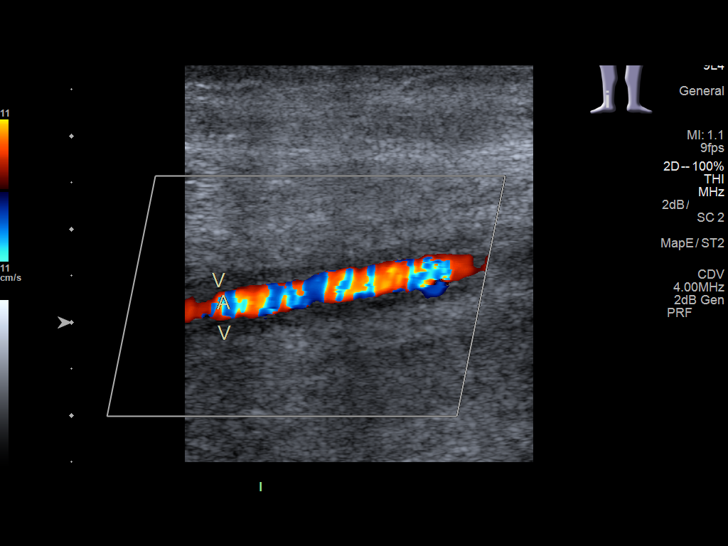
[im 39/60]
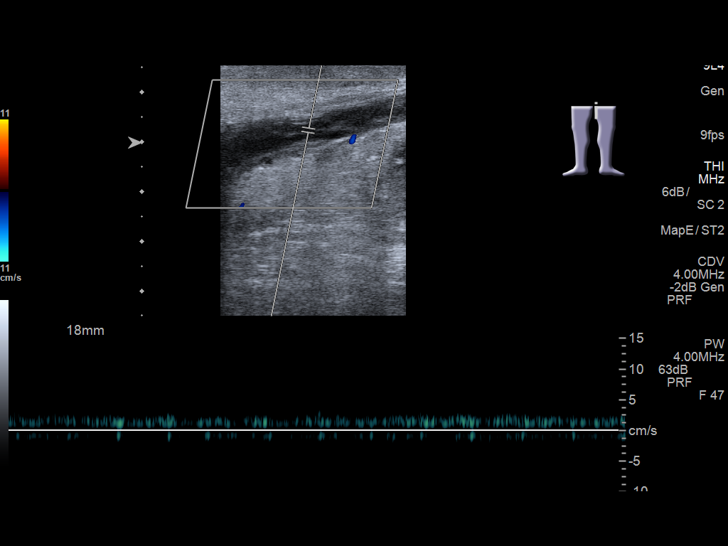
[im 44/60]
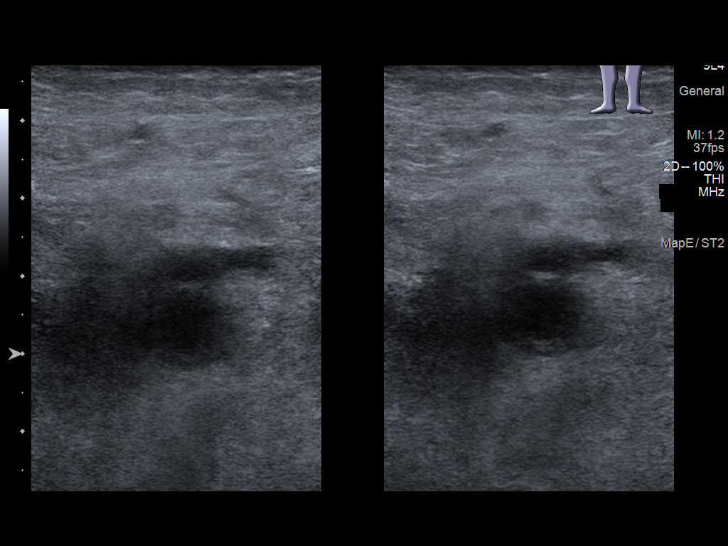
[im 49/60]
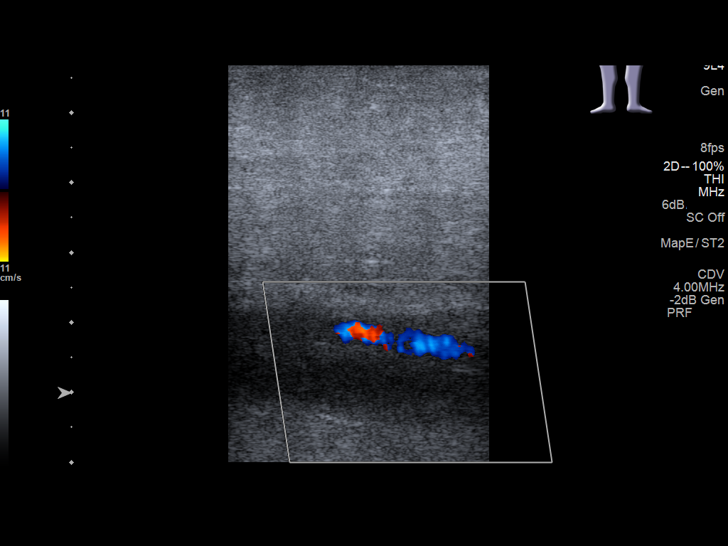
[im 54/60]
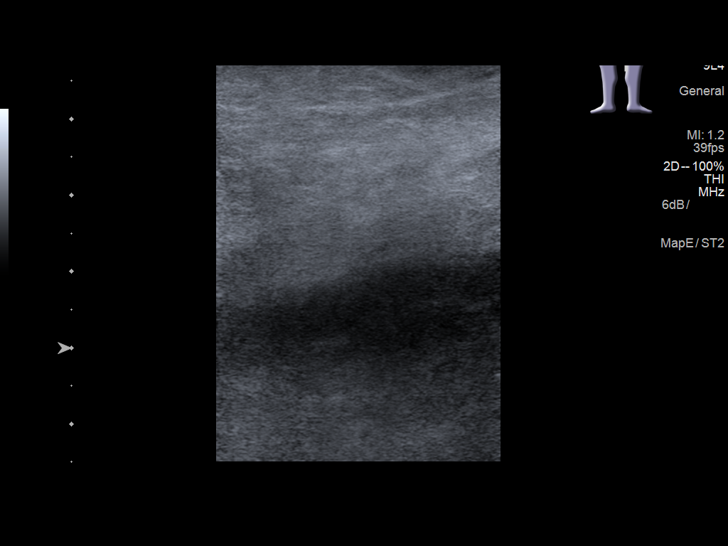
[im 60/60]
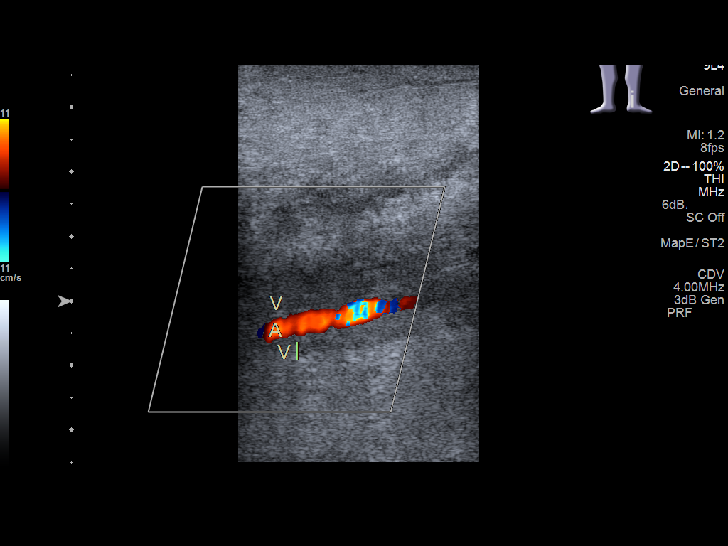

[13 of 24 positions shown; findings below may reference images not displayed]

FINDINGS: RIGHT LOWER EXTREMITY

There is mixed echogenic occlusive DVT extending from the right
common femoral vein through the saphenofemoral junction, the imaged
portions of the deep femoral vein, the proximal, mid and distal
aspects of the right femoral vein, the popliteal vein and extending
to involve the imaged portions of the tibial veins.

Other Findings:  None.

LEFT LOWER EXTREMITY

There is mixed echogenic occlusive DVT extending from the left
common femoral vein through the saphenofemoral junction, the imaged
portions of the deep femoral vein, the proximal, mid and distal
aspects of the left femoral vein, the popliteal vein and extending
to involve the imaged portions of the tibial veins.

Other Findings:  None.
IMPRESSION: Examination is positive for extensive occlusive bilateral lower
extremity DVT involving the entirety of the bilateral lower
extremity venous system.

Note, bilateral pelvic venous and IVC thrombus was seen on preceding
abdominal CT performed 10/16/2017.

These results will be called to the ordering clinician or
representative by the Radiologist Assistant, and communication
documented in the PACS or zVision Dashboard.

## 2019-05-08 IMAGING — CT CT CTA ABD/PEL W/CM AND/OR W/O CM
4 of 10 series · 9 of 46 positions shown, 14 images · IV contrast (iopamidol)
Comparison: Abdominal CT dated 02/07/2017

CLINICAL DATA: 61-year-old female with history of pancreatitis
presents with leg and abdominal pain.

EXAM:
CTA ABDOMEN AND PELVIS wITHOUT AND WITH CONTRAST
TECHNIQUE: Multidetector CT imaging of the abdomen and pelvis was performed
using the standard protocol during bolus administration of
intravenous contrast. Multiplanar reconstructed images and MIPs were
obtained and reviewed to evaluate the vascular anatomy.
CONTRAST:  100mL OKZE88-HM1 IOPAMIDOL (OKZE88-HM1) INJECTION 76%

[Series 5: axial arterial · axial · arterial · 0.77mm/px · z∈[-1162,-1090]mm · 2 of 251 slices shown]
[im 18/251  soft-tissue]
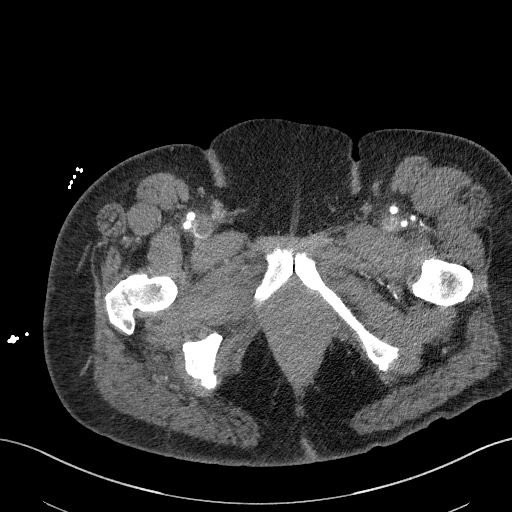
[im 54/251  soft-tissue]
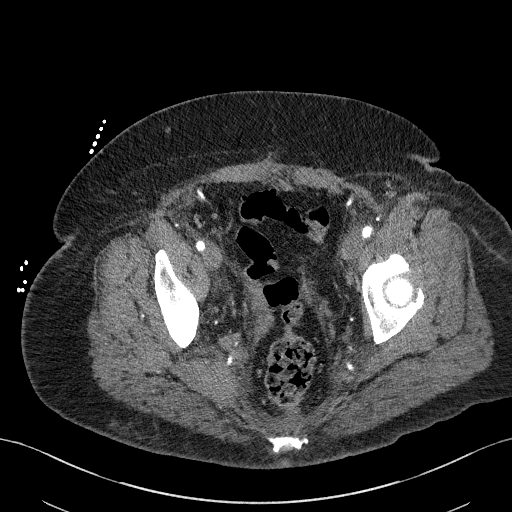

[Series 6: axial venous · axial · portal-venous · 0.77mm/px · z∈[-1096,-796]mm · 4 of 101 slices shown, 9 images]
[im 21/101  soft-tissue]
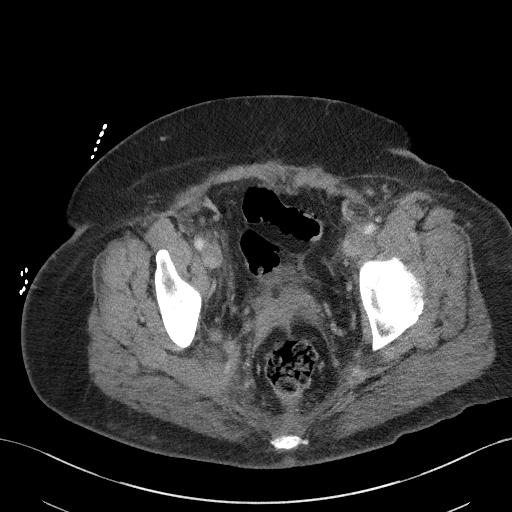
[im 21/101  lung]
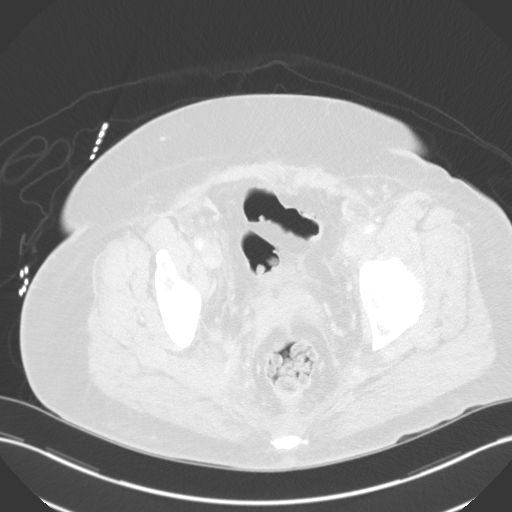
[im 21/101  bone]
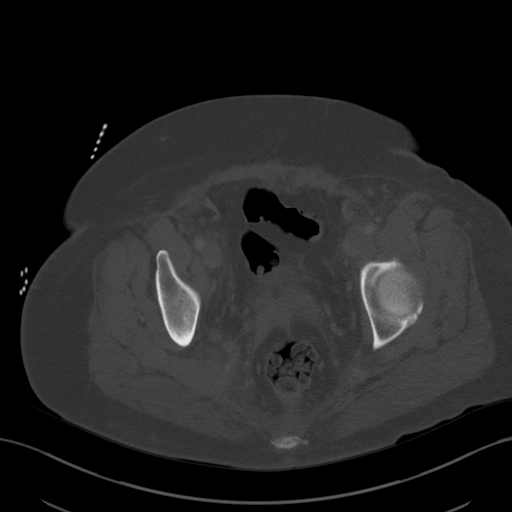
[im 41/101  soft-tissue]
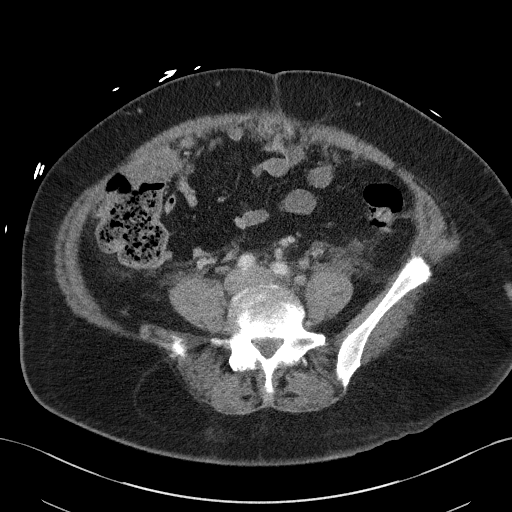
[im 41/101  lung]
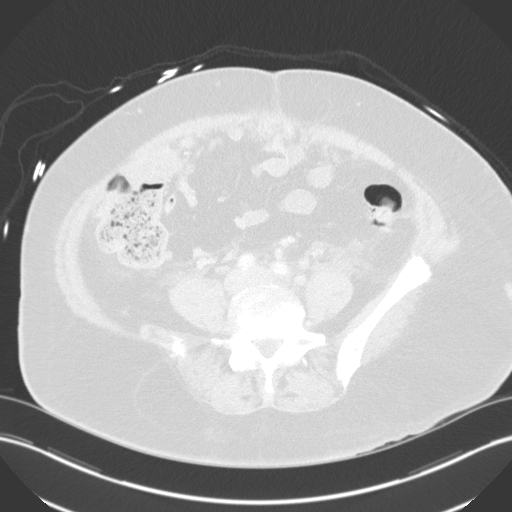
[im 61/101  soft-tissue]
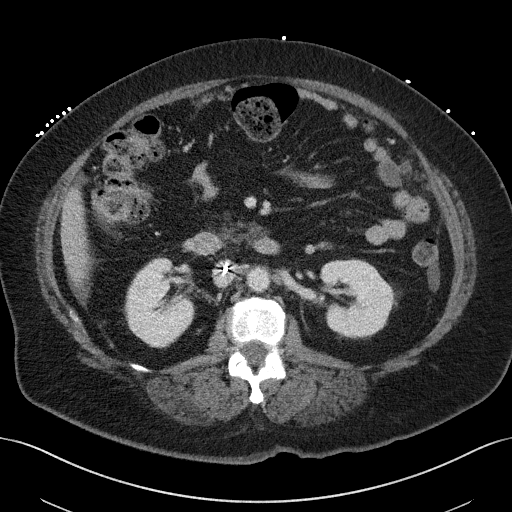
[im 61/101  lung]
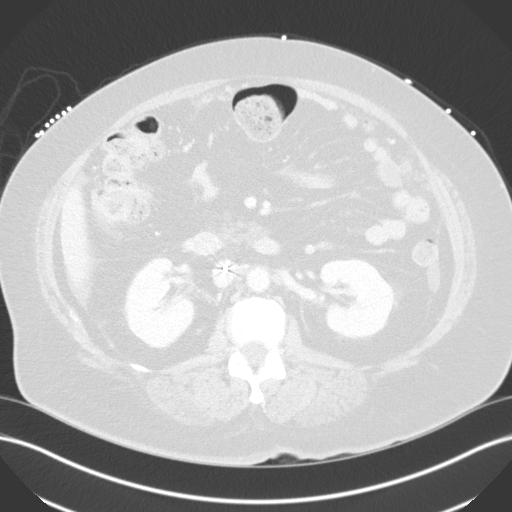
[im 81/101  soft-tissue]
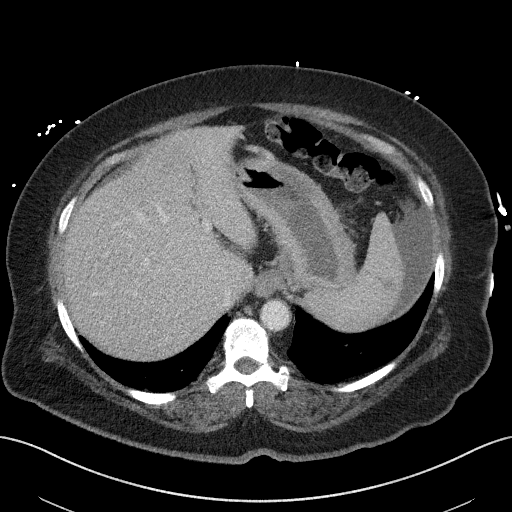
[im 81/101  lung]
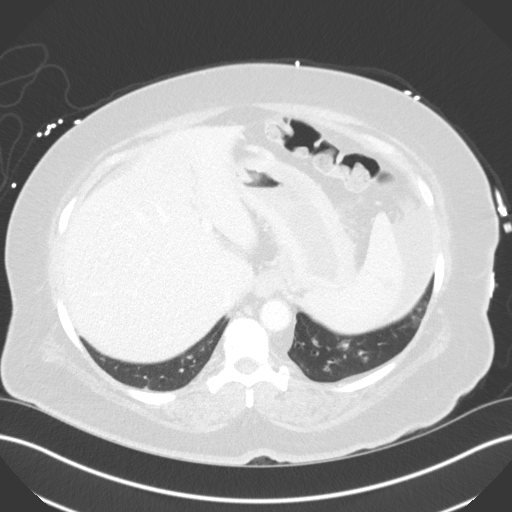

[Series 9: coronal mpr · coronal · 0.75mm/px · 2 of 166 slices shown]
[im 56/166  soft-tissue]
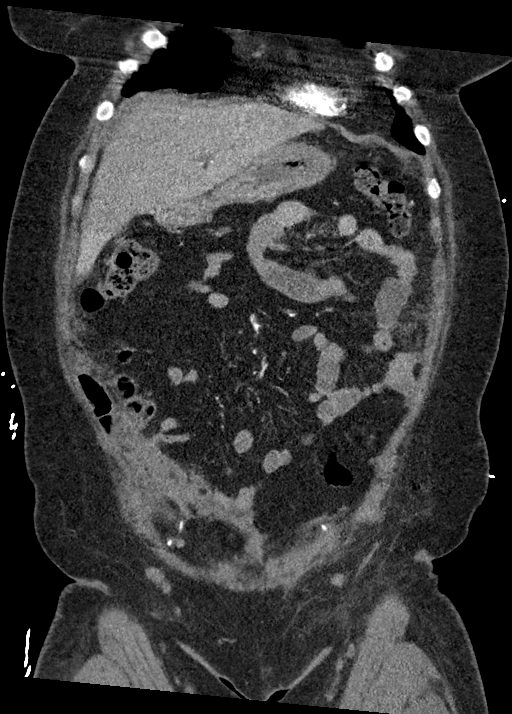
[im 111/166  soft-tissue]
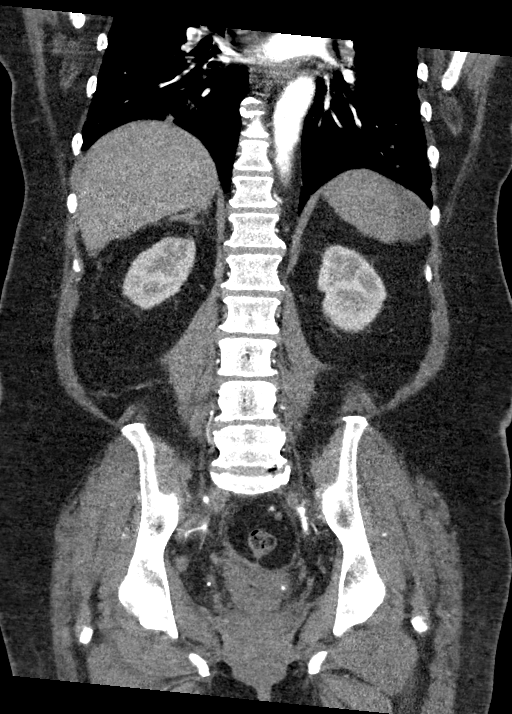

[Series 10: sagittal mpr · sagittal · 0.64mm/px · 1 of 194 slices shown]
[im 27/194  soft-tissue]
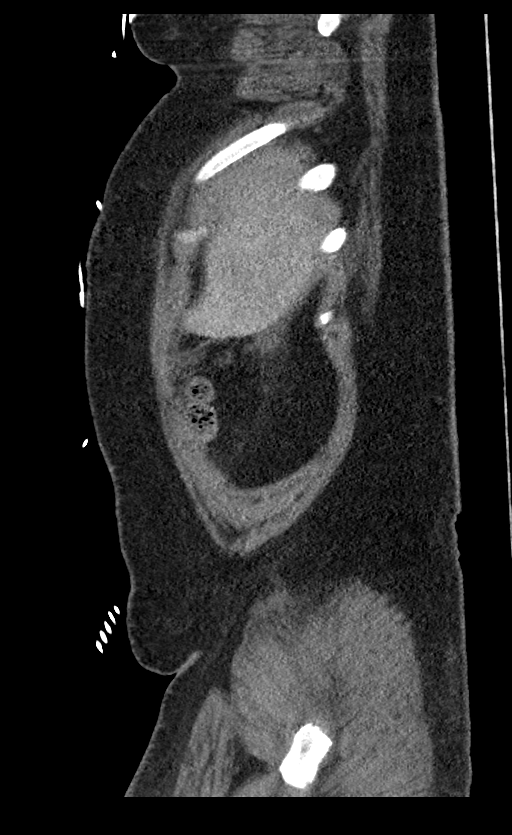

[9 of 46 positions shown; findings below may reference images not displayed]

FINDINGS: VASCULAR

Aorta: Mild atherosclerotic calcification. No aneurysmal dilatation
or dissection.

Celiac: Patent without evidence of aneurysm, dissection, vasculitis
or significant stenosis.

SMA: Patent without evidence of aneurysm, dissection, vasculitis or
significant stenosis.

Renals: Both renal arteries are patent without evidence of aneurysm,
dissection, vasculitis, fibromuscular dysplasia or significant
stenosis.

IMA: Patent without evidence of aneurysm, dissection, vasculitis or
significant stenosis.

Inflow: Mild atherosclerotic calcification of the iliac vessels. No
aneurysmal dilatation or dissection. The iliac arteries are patent
bilaterally.

Proximal Outflow: Bilateral common femoral and visualized portions
of the superficial and profunda femoral arteries are patent without
evidence of aneurysm, dissection, vasculitis or significant
stenosis.

Veins: Extensive near occlusive thrombus extending from the
visualized portions of the common femoral veins, external and common
iliac veins and IVC to the level of the IVC filter. There is small
amount of contrast in the peripheral lumen of these vessels. The
suprarenal IVC filled with contrast and appears patent. No definite
thrombus identified above the level of the IVC filter. The SMV, the
SMV, and main portal vein are patent. There is chronic appearing
occlusion of the splenic vein with multiple collaterals. No portal
venous gas.

Review of the MIP images confirms the above findings.

NON-VASCULAR

Lower chest: Multiple bilateral pulmonary nodules most consistent
with metastatic disease. An infectious process or septic emboli are
less likely. Clinical correlation is recommended. Partially
visualized area of lower attenuation in the right lower
lobe/infrahilar vascular confluence (series 6, image 5). This is
incompletely evaluated and may represent volume averaging artifact
with perihilar fat or scarring. Acute pulmonary embolus is not
entirely excluded. Clinical correlation is recommended. If there is
clinical concern for acute PE further evaluation with CTA of the
chest or V/Q scan recommended.

There is no intra-abdominal free air. Small upper abdominal ascites
as well as small amount of fluid within the pelvis.

Hepatobiliary: An ill-defined focal area of heterogeneous
enhancement is seen in the left lobe of the liver (series 6, image
23). The liver is otherwise unremarkable. No intrahepatic biliary
ductal dilatation. Cholecystectomy.

Pancreas: There is fatty infiltration of the uncinate and head of
the pancreas. There is prominence of the soft tissues of the distal
body and tail of the pancreas which may represent extension of the
previously seen pancreatic mass or pancreatitis. Correlation with
pancreatic enzymes recommended.

Spleen: Multiple small splenic hypodense lesions appear new or more
prominent compared to the prior CT and concerning for extension of
pancreatic neoplasm. Areas of infarct is less likely but not
excluded.

Adrenals/Urinary Tract: The adrenal glands are unremarkable. The
kidneys, and the visualized ureters appear unremarkable as well. The
urinary bladder is predominantly collapsed. There is apparent
diffuse thickening of the bladder wall which may be partly related
to underdistention. Cystitis is not excluded. Correlation with
urinalysis recommended.

Stomach/Bowel: Postsurgical changes of bowel with anastomotic suture
in the sigmoid colon. There is sigmoid diverticulosis without active
inflammatory changes. Moderate amount of stool noted throughout the
colon no bowel obstruction. Normal appendix.

Lymphatic: Mildly enlarged lymph node in the [place]
measuring 14 mm in short axis. Multiple mildly rounded small
retroperitoneal and para-aortic lymph nodes.

Reproductive: Hysterectomy.

Other: Extensive omental implants and nodularity, new compared to
prior CT and consistent with omental caking.

Musculoskeletal: Degenerative changes of the spine. No acute osseous
pathology.
IMPRESSION: VASCULAR

1. Extensive near complete occlusive thrombus extending from the
common femoral veins to the IVC at the level of the IVC filter. No
definite thrombus noted above the IVC.
2. Partially visualized small low-attenuation in the right lower
lobe/right infrahilar pulmonary artery confluence may represent
perivascular hilar fat or scarring. Acute PE is not entirely
excluded. CTA of the chest or V/Q scan may provide better evaluation
if there is clinical concern for acute PE.
3. Chronic thrombosis of the splenic vein.

NON-VASCULAR

1. Enlargement of the distal body and tail of the pancreas likely
from extension of the previously seen pancreatic mass.
2. Metastatic disease including omental implant, splenic lesions,
and pulmonary nodules. Small ascites, likely malignant. Further
evaluation with PET-CT and oncological consult is advised.
3. Mildly enlarged porta hepatic lymph node as well as slightly
rounded retroperitoneal and para-aortic lymph nodes.
4. Focal ill-defined heterogeneous enhancement of the left lobe of
the liver.

These results were called by telephone at the time of interpretation
on 10/16/2017 at [DATE] to Dr. Chic, who verbally acknowledged
these results.

## 2019-05-09 IMAGING — CT CT BIOPSY
1 of 2 series · 15 of 32 positions shown, 19 images · non-contrast
Comparison: none

INDICATION: Right lower quadrant omental mass, unknown primary

[Series 2: i-spiral 5.0 b30f · axial · 0.81mm/px · z∈[-272,-132]mm · 15 of 44 slices shown, 19 images]
[im 2/44  soft-tissue]
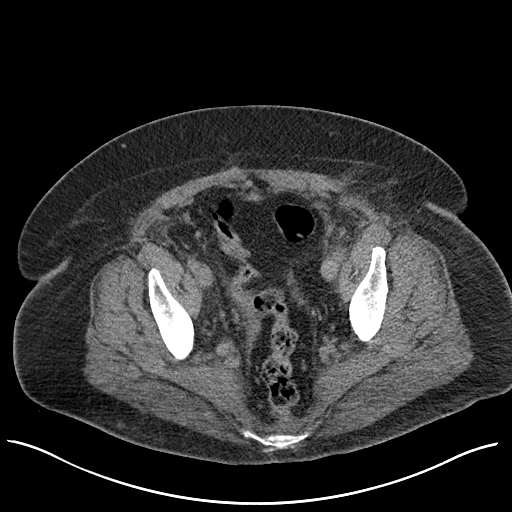
[im 2/44  bone]
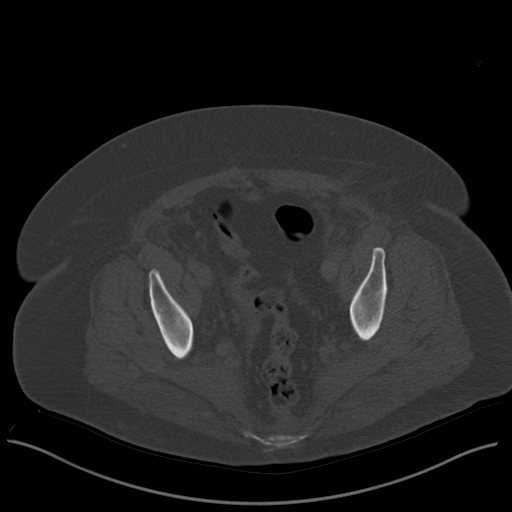
[im 6/44  soft-tissue]
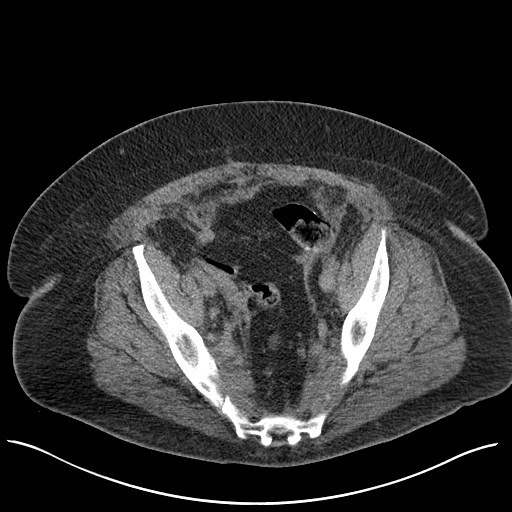
[im 10/44  soft-tissue]
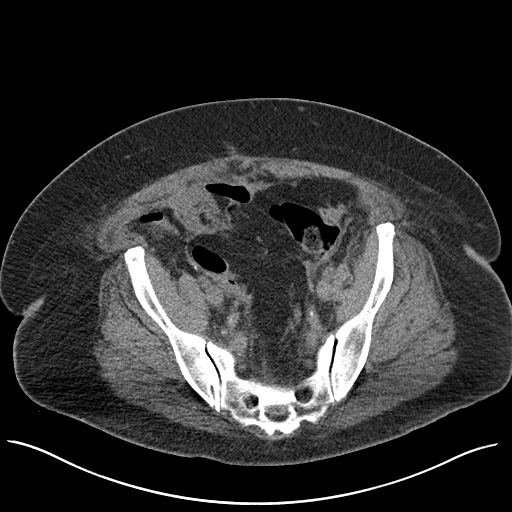
[im 12/44  soft-tissue]
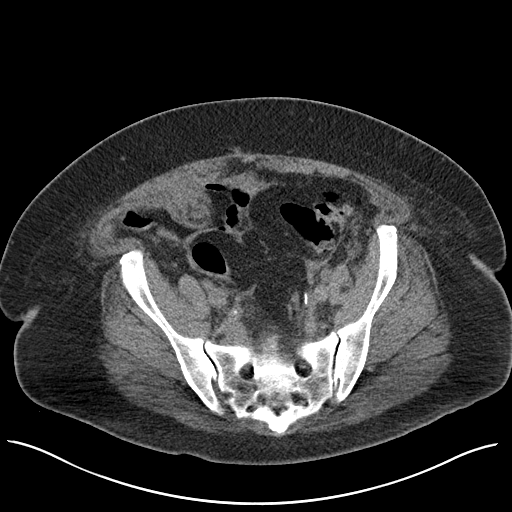
[im 16/44  soft-tissue]
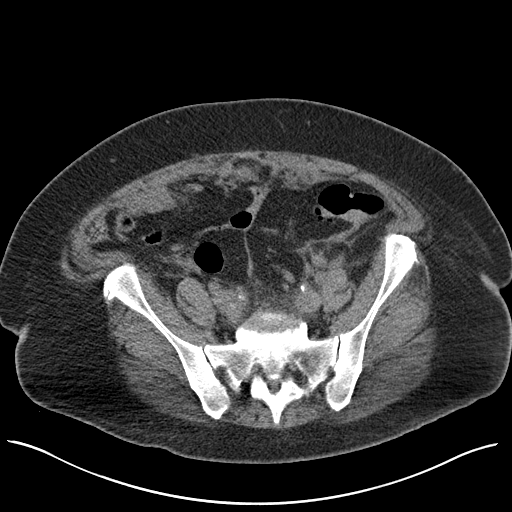
[im 18/44  soft-tissue]
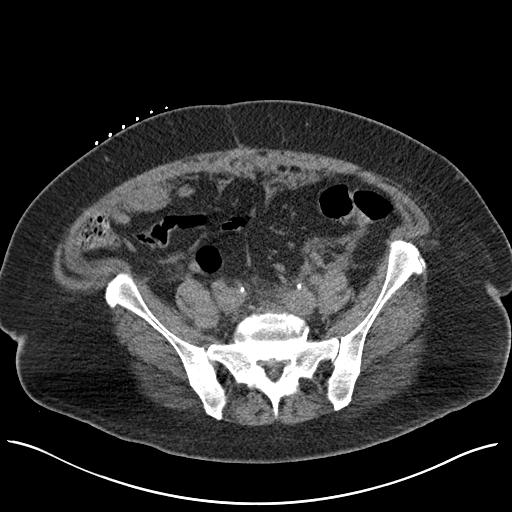
[im 22/44  soft-tissue]
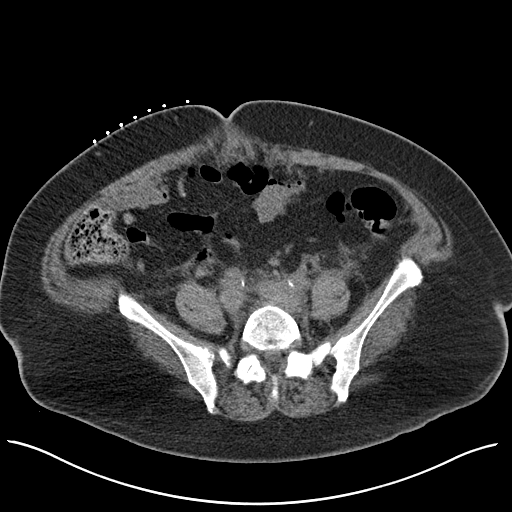
[im 26/44  soft-tissue]
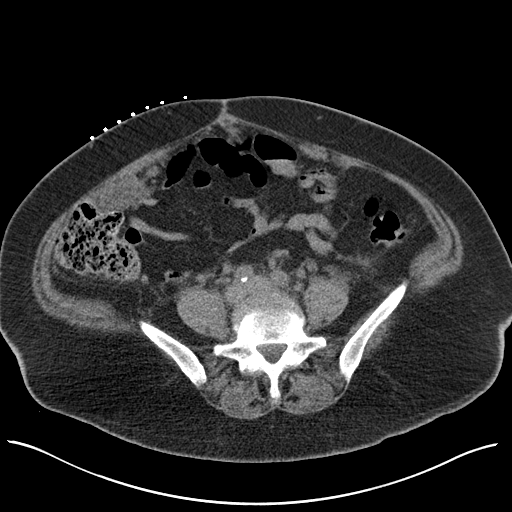
[im 28/44  soft-tissue]
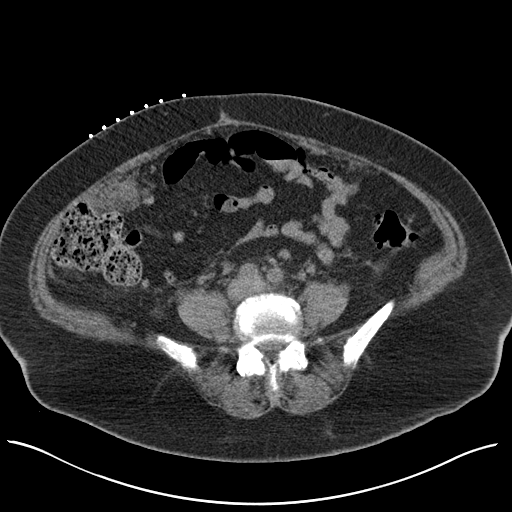
[im 28/44  bone]
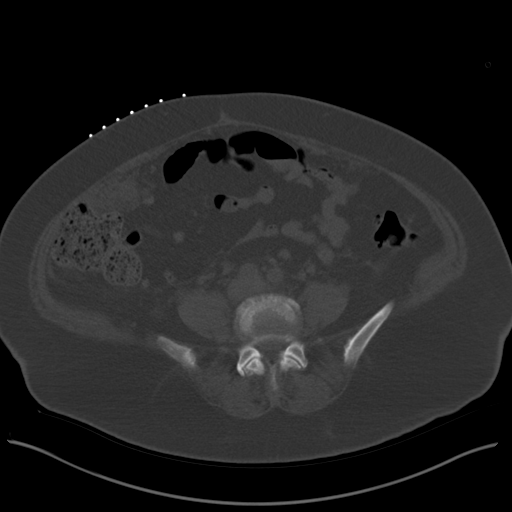
[im 32/44  soft-tissue]
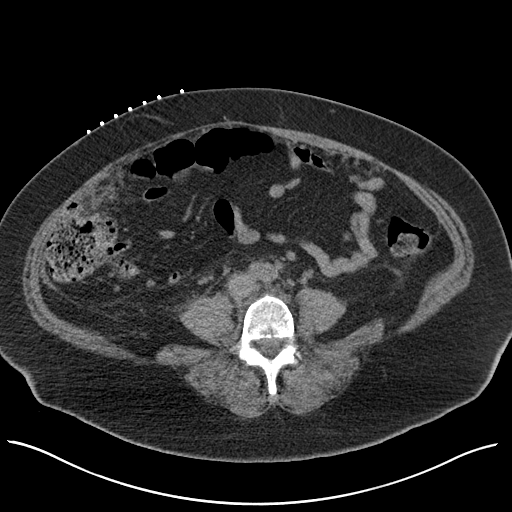
[im 34/44  soft-tissue]
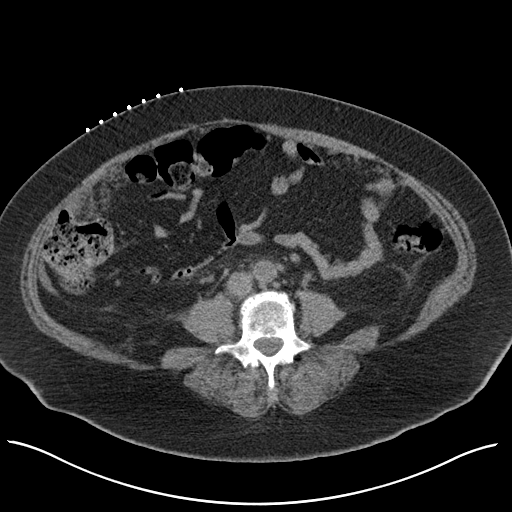
[im 36/44  lung]
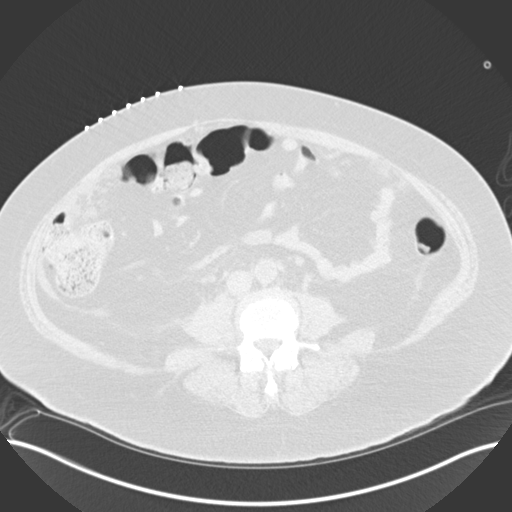
[im 38/44  soft-tissue]
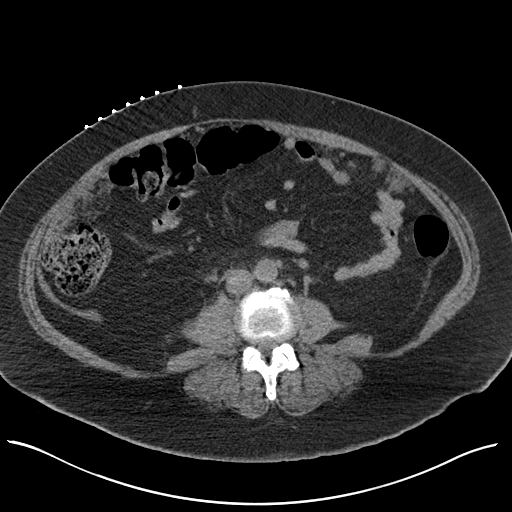
[im 38/44  lung]
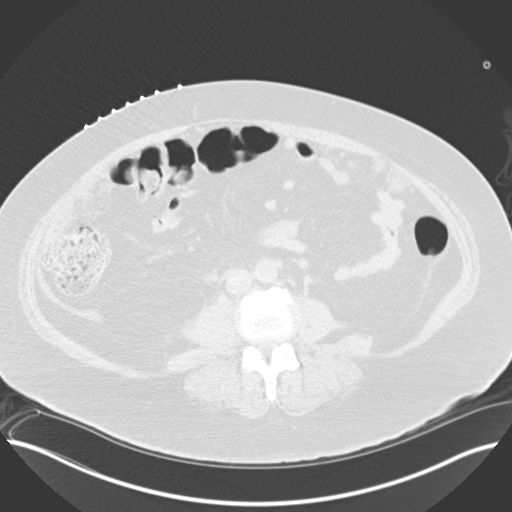
[im 40/44  lung]
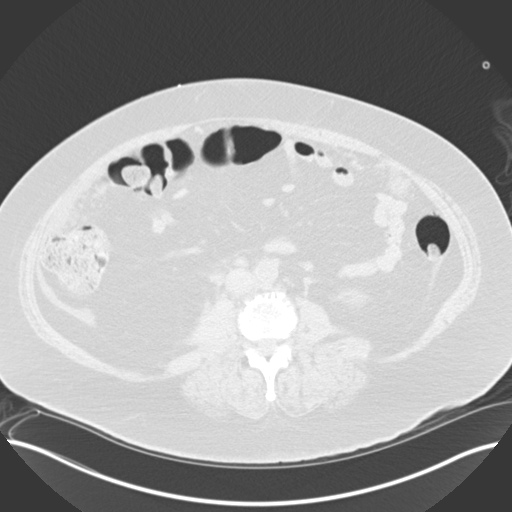
[im 42/44  soft-tissue]
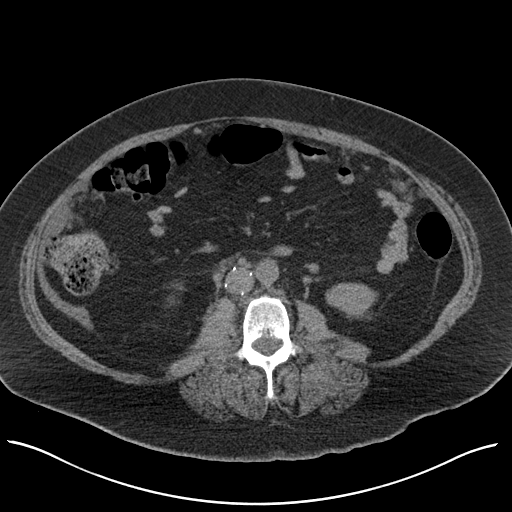
[im 42/44  lung]
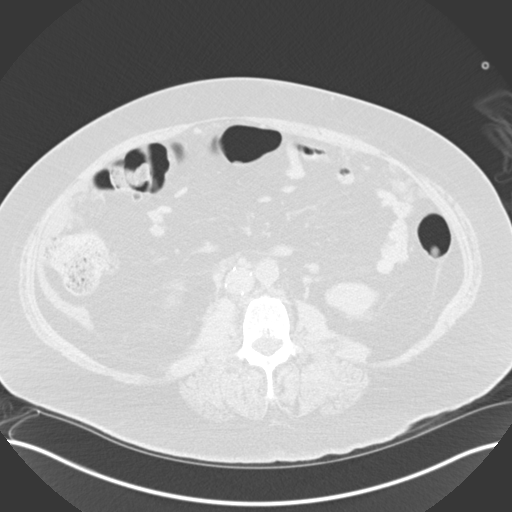

[15 of 32 positions shown; findings below may reference images not displayed]

EXAM:
CT-GUIDED BIOPSY RIGHT LOWER QUADRANT OMENTAL MASS

MEDICATIONS:
1% LIDOCAINE LOCAL

ANESTHESIA/SEDATION:
2.0 mg IV Versed; 75 mcg IV Fentanyl

Moderate Sedation Time:  11 MINUTES

The patient was continuously monitored during the procedure by the
interventional radiology nurse under my direct supervision.

PROCEDURE:
The procedure, risks, benefits, and alternatives were explained to
the patient. Questions regarding the procedure were encouraged and
answered. The patient understands and consents to the procedure.

Previous imaging reviewed. Patient positioned supine. Noncontrast
localization CT performed. The right lower quadrant omental mass
anterior to the cecum was localized. Overlying skin marked.

Under sterile conditions and local anesthesia, a 17 gauge 6.8 cm
access needle was advanced from a lateral approach to the right
lower quadrant omental mass. Needle position confirmed with CT. 18
gauge core biopsies obtained. Samples placed in formalin. Needle
removed. Postprocedure imaging demonstrates no hemorrhage or
hematoma.

Patient tolerated the procedure well without complication. Vital
sign monitoring by nursing staff during the procedure will continue
as patient is in the special procedures unit for post procedure
observation.
FINDINGS: The images document guide needle placement within the right lower
quadrant omental mass. Post biopsy images demonstrate no hemorrhage
or hematoma.

COMPLICATIONS:
None immediate.
IMPRESSION: Successful CT-guided core biopsy of the right lower quadrant omental
mass
